# Patient Record
Sex: Female | Born: 1940
Health system: Southern US, Community
[De-identification: ages and names within clinical notes are randomized; demographics above are authoritative.]

## PROBLEM LIST (undated history)

## (undated) DIAGNOSIS — M069 Rheumatoid arthritis, unspecified: Secondary | ICD-10-CM

## (undated) DIAGNOSIS — E785 Hyperlipidemia, unspecified: Secondary | ICD-10-CM

## (undated) DIAGNOSIS — E119 Type 2 diabetes mellitus without complications: Secondary | ICD-10-CM

## (undated) DIAGNOSIS — J439 Emphysema, unspecified: Secondary | ICD-10-CM

## (undated) DIAGNOSIS — I10 Essential (primary) hypertension: Secondary | ICD-10-CM

## (undated) DIAGNOSIS — R42 Dizziness and giddiness: Secondary | ICD-10-CM

## (undated) DIAGNOSIS — C539 Malignant neoplasm of cervix uteri, unspecified: Secondary | ICD-10-CM

## (undated) HISTORY — DX: Emphysema, unspecified: J43.9

## (undated) HISTORY — PX: TUBAL LIGATION: SHX77

## (undated) HISTORY — DX: Malignant neoplasm of cervix uteri, unspecified: C53.9

## (undated) HISTORY — DX: Essential (primary) hypertension: I10

## (undated) HISTORY — DX: Rheumatoid arthritis, unspecified: M06.9

## (undated) HISTORY — DX: Dizziness and giddiness: R42

## (undated) HISTORY — PX: ABDOMINAL HYSTERECTOMY: SHX81

## (undated) HISTORY — PX: BREAST SURGERY: SHX581

## (undated) HISTORY — PX: LIPOMA EXCISION: SHX5283

## (undated) HISTORY — PX: OTHER SURGICAL HISTORY: SHX169

## (undated) HISTORY — DX: Type 2 diabetes mellitus without complications: E11.9

## (undated) HISTORY — DX: Hyperlipidemia, unspecified: E78.5

## (undated) HISTORY — PX: TUMOR REMOVAL: SHX12

---

## 1997-11-21 ENCOUNTER — Ambulatory Visit (HOSPITAL_COMMUNITY): Admission: RE | Admit: 1997-11-21 | Discharge: 1997-11-21 | Payer: Self-pay | Admitting: Family Medicine

## 1997-12-20 ENCOUNTER — Other Ambulatory Visit: Admission: RE | Admit: 1997-12-20 | Discharge: 1997-12-20 | Payer: Self-pay | Admitting: Family Medicine

## 1998-02-04 ENCOUNTER — Other Ambulatory Visit: Admission: RE | Admit: 1998-02-04 | Discharge: 1998-02-04 | Payer: Self-pay | Admitting: Obstetrics and Gynecology

## 1998-02-18 ENCOUNTER — Ambulatory Visit (HOSPITAL_COMMUNITY): Admission: RE | Admit: 1998-02-18 | Discharge: 1998-02-18 | Payer: Self-pay | Admitting: Family Medicine

## 1998-02-19 ENCOUNTER — Ambulatory Visit (HOSPITAL_COMMUNITY): Admission: RE | Admit: 1998-02-19 | Discharge: 1998-02-19 | Payer: Self-pay | Admitting: Obstetrics and Gynecology

## 1998-02-26 ENCOUNTER — Inpatient Hospital Stay (HOSPITAL_COMMUNITY): Admission: RE | Admit: 1998-02-26 | Discharge: 1998-03-03 | Payer: Self-pay | Admitting: Obstetrics and Gynecology

## 1998-05-19 ENCOUNTER — Inpatient Hospital Stay (HOSPITAL_COMMUNITY): Admission: EM | Admit: 1998-05-19 | Discharge: 1998-05-26 | Payer: Self-pay | Admitting: Emergency Medicine

## 1998-12-25 ENCOUNTER — Other Ambulatory Visit: Admission: RE | Admit: 1998-12-25 | Discharge: 1998-12-25 | Payer: Self-pay | Admitting: Obstetrics and Gynecology

## 1999-12-25 ENCOUNTER — Ambulatory Visit (HOSPITAL_COMMUNITY): Admission: RE | Admit: 1999-12-25 | Discharge: 1999-12-25 | Payer: Self-pay | Admitting: *Deleted

## 2001-07-27 ENCOUNTER — Ambulatory Visit (HOSPITAL_COMMUNITY): Admission: RE | Admit: 2001-07-27 | Discharge: 2001-07-27 | Payer: Self-pay | Admitting: Family Medicine

## 2003-04-23 ENCOUNTER — Ambulatory Visit (HOSPITAL_COMMUNITY): Admission: RE | Admit: 2003-04-23 | Discharge: 2003-04-23 | Payer: Self-pay | Admitting: Family Medicine

## 2004-03-11 ENCOUNTER — Ambulatory Visit: Payer: Self-pay | Admitting: Nurse Practitioner

## 2004-03-21 ENCOUNTER — Ambulatory Visit: Payer: Self-pay | Admitting: *Deleted

## 2004-11-06 ENCOUNTER — Ambulatory Visit: Payer: Self-pay | Admitting: Nurse Practitioner

## 2004-12-01 ENCOUNTER — Ambulatory Visit: Payer: Self-pay | Admitting: Nurse Practitioner

## 2004-12-08 ENCOUNTER — Ambulatory Visit (HOSPITAL_COMMUNITY): Admission: RE | Admit: 2004-12-08 | Discharge: 2004-12-08 | Payer: Self-pay | Admitting: Family Medicine

## 2004-12-15 ENCOUNTER — Ambulatory Visit: Payer: Self-pay | Admitting: Nurse Practitioner

## 2004-12-29 ENCOUNTER — Ambulatory Visit: Payer: Self-pay | Admitting: Nurse Practitioner

## 2005-01-26 ENCOUNTER — Ambulatory Visit: Payer: Self-pay | Admitting: Family Medicine

## 2005-03-24 ENCOUNTER — Ambulatory Visit: Payer: Self-pay | Admitting: Nurse Practitioner

## 2005-10-21 ENCOUNTER — Encounter: Payer: Self-pay | Admitting: Family Medicine

## 2006-01-14 ENCOUNTER — Ambulatory Visit: Payer: Self-pay | Admitting: Nurse Practitioner

## 2006-01-21 ENCOUNTER — Ambulatory Visit (HOSPITAL_COMMUNITY): Admission: RE | Admit: 2006-01-21 | Discharge: 2006-01-21 | Payer: Self-pay | Admitting: Family Medicine

## 2006-04-13 ENCOUNTER — Ambulatory Visit: Payer: Self-pay | Admitting: Nurse Practitioner

## 2006-06-12 ENCOUNTER — Emergency Department (HOSPITAL_COMMUNITY): Admission: EM | Admit: 2006-06-12 | Discharge: 2006-06-12 | Payer: Self-pay | Admitting: Emergency Medicine

## 2007-10-06 ENCOUNTER — Ambulatory Visit: Payer: Self-pay | Admitting: Pulmonary Disease

## 2007-10-06 DIAGNOSIS — E785 Hyperlipidemia, unspecified: Secondary | ICD-10-CM | POA: Insufficient documentation

## 2007-10-06 DIAGNOSIS — E1159 Type 2 diabetes mellitus with other circulatory complications: Secondary | ICD-10-CM | POA: Insufficient documentation

## 2007-10-06 DIAGNOSIS — J383 Other diseases of vocal cords: Secondary | ICD-10-CM | POA: Insufficient documentation

## 2007-10-06 DIAGNOSIS — R0602 Shortness of breath: Secondary | ICD-10-CM

## 2007-10-06 DIAGNOSIS — I10 Essential (primary) hypertension: Secondary | ICD-10-CM | POA: Insufficient documentation

## 2007-10-06 DIAGNOSIS — Z8541 Personal history of malignant neoplasm of cervix uteri: Secondary | ICD-10-CM | POA: Insufficient documentation

## 2007-10-06 DIAGNOSIS — J438 Other emphysema: Secondary | ICD-10-CM | POA: Insufficient documentation

## 2007-10-06 DIAGNOSIS — J45909 Unspecified asthma, uncomplicated: Secondary | ICD-10-CM | POA: Insufficient documentation

## 2007-10-06 DIAGNOSIS — R0600 Dyspnea, unspecified: Secondary | ICD-10-CM | POA: Insufficient documentation

## 2007-10-24 ENCOUNTER — Ambulatory Visit: Payer: Self-pay | Admitting: Pulmonary Disease

## 2007-11-09 ENCOUNTER — Encounter: Payer: Self-pay | Admitting: Pulmonary Disease

## 2007-11-21 ENCOUNTER — Encounter: Payer: Self-pay | Admitting: Pulmonary Disease

## 2007-12-10 IMAGING — CR DG KNEE COMPLETE 4+V*L*
4 series · 4 of 4 positions shown · non-contrast
Comparison: none

CLINICAL DATA: 55-year-old, fell.
 RIGHT HAND ? 3 VIEW:

[t knee ap left *]
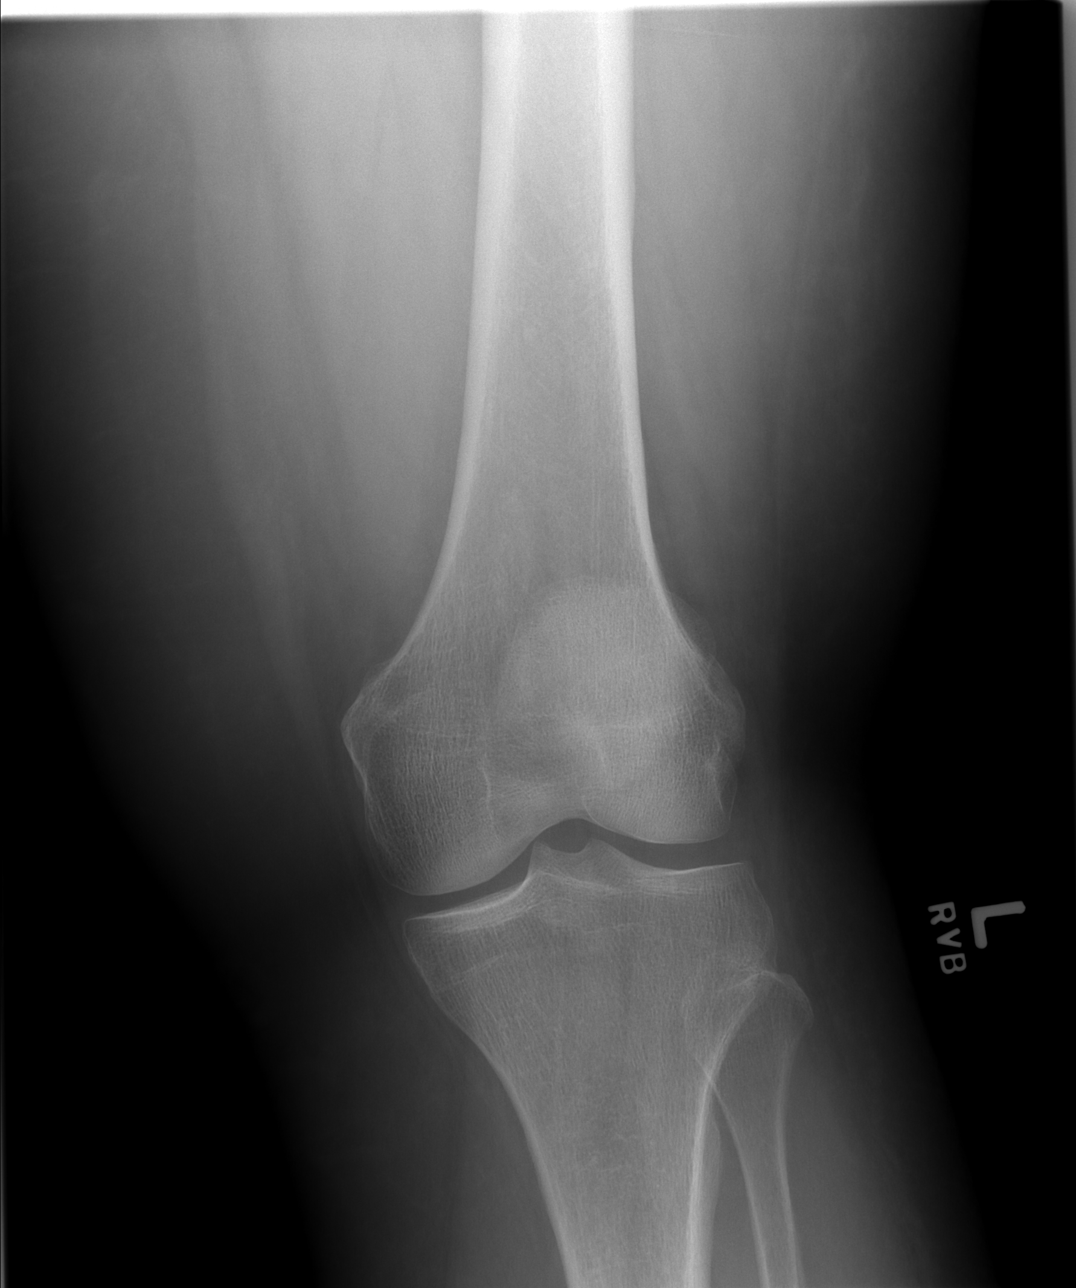

[t knee oblique left (1 of 2)]
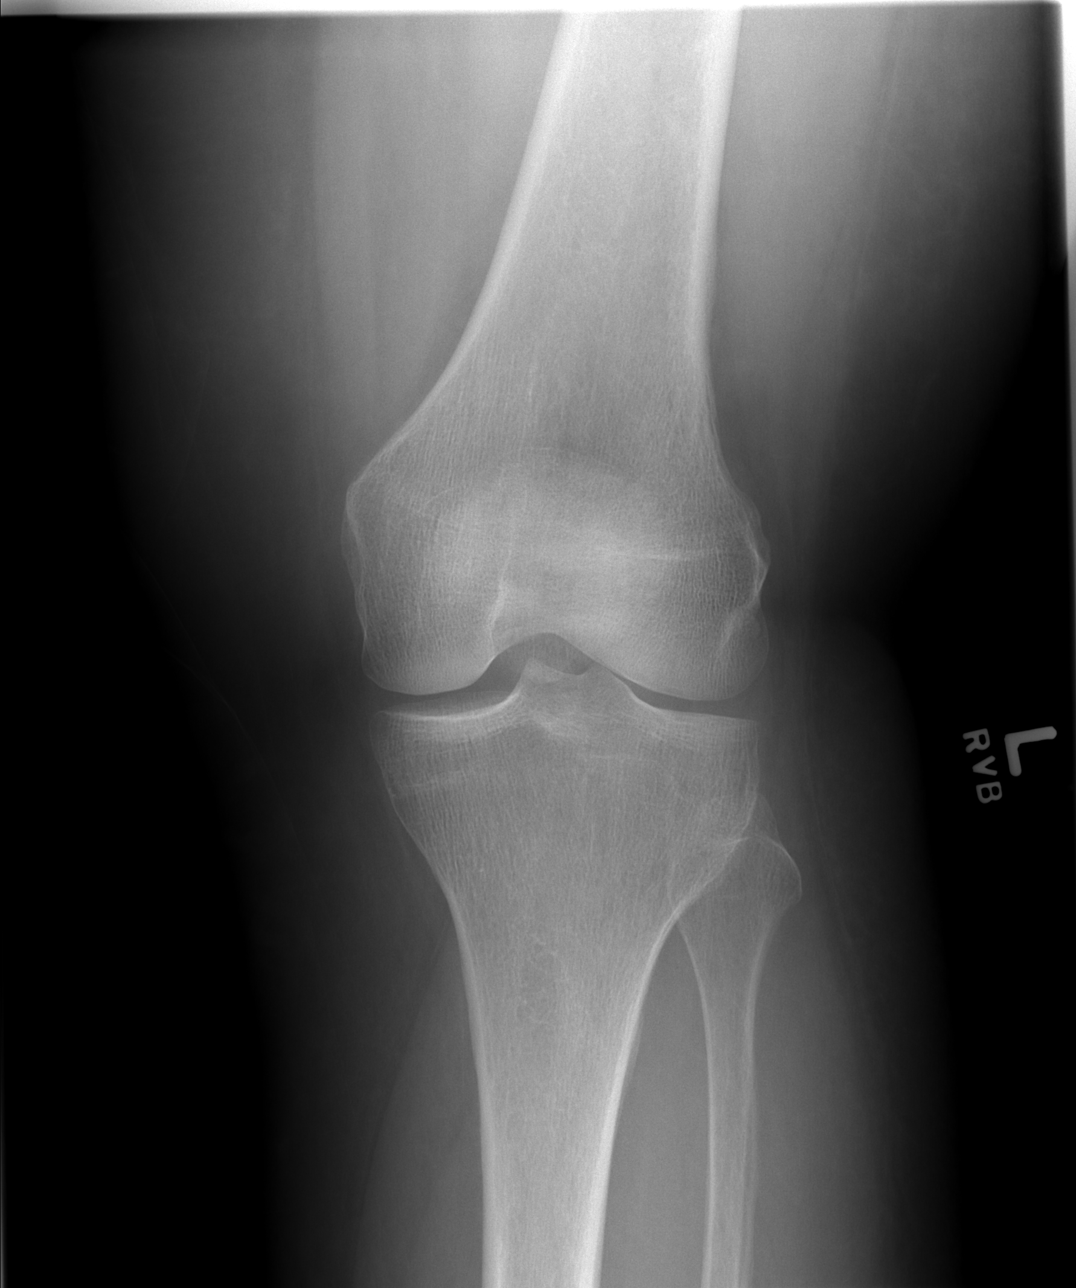

[t knee oblique left (2 of 2)]
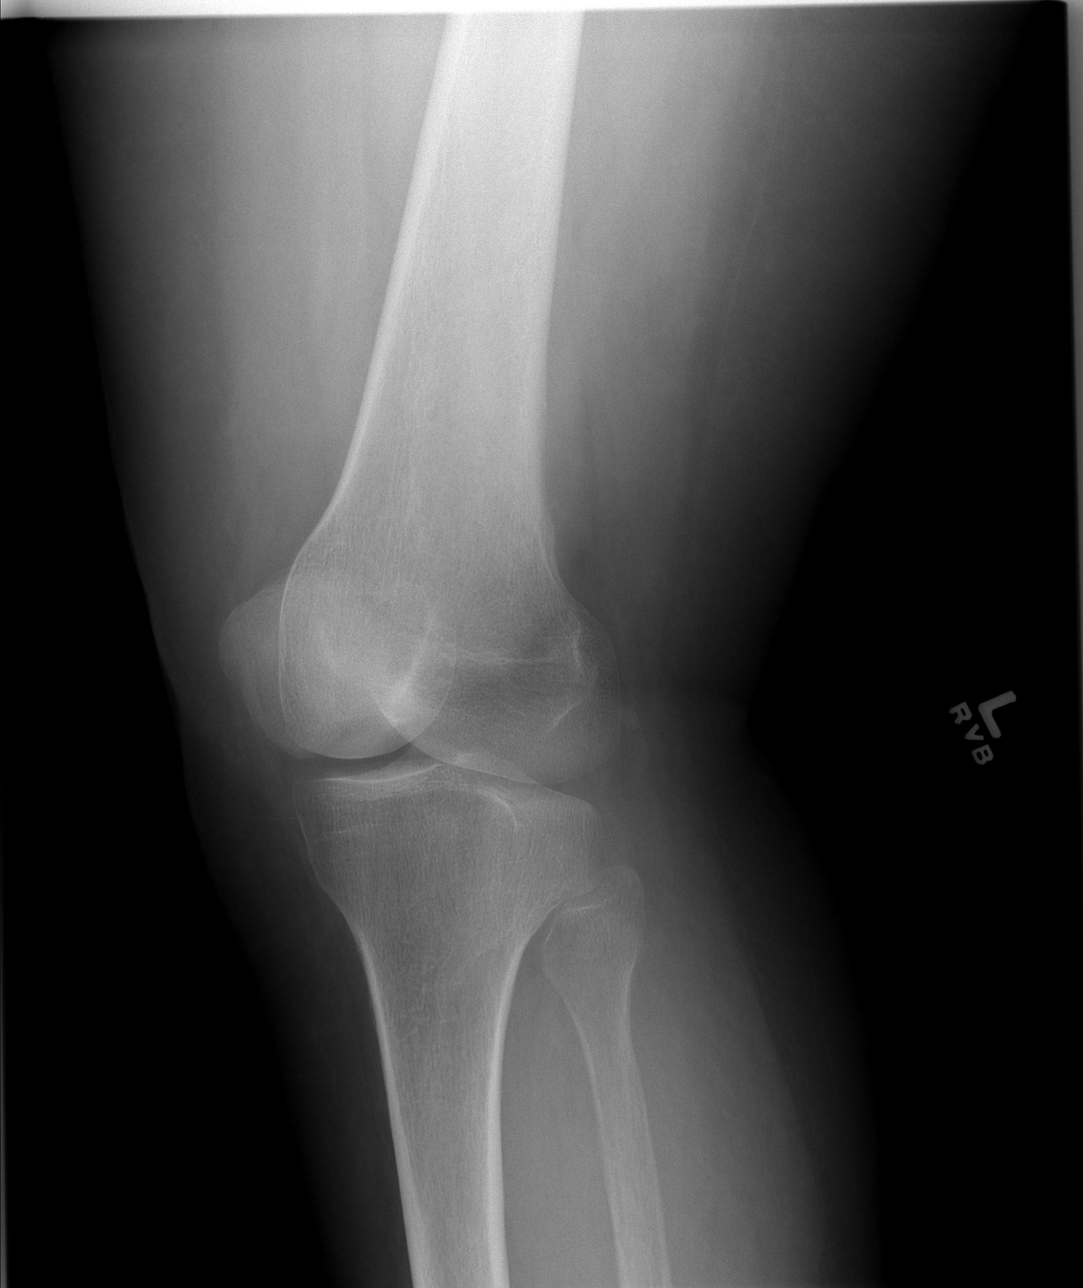

[t knee lat left]
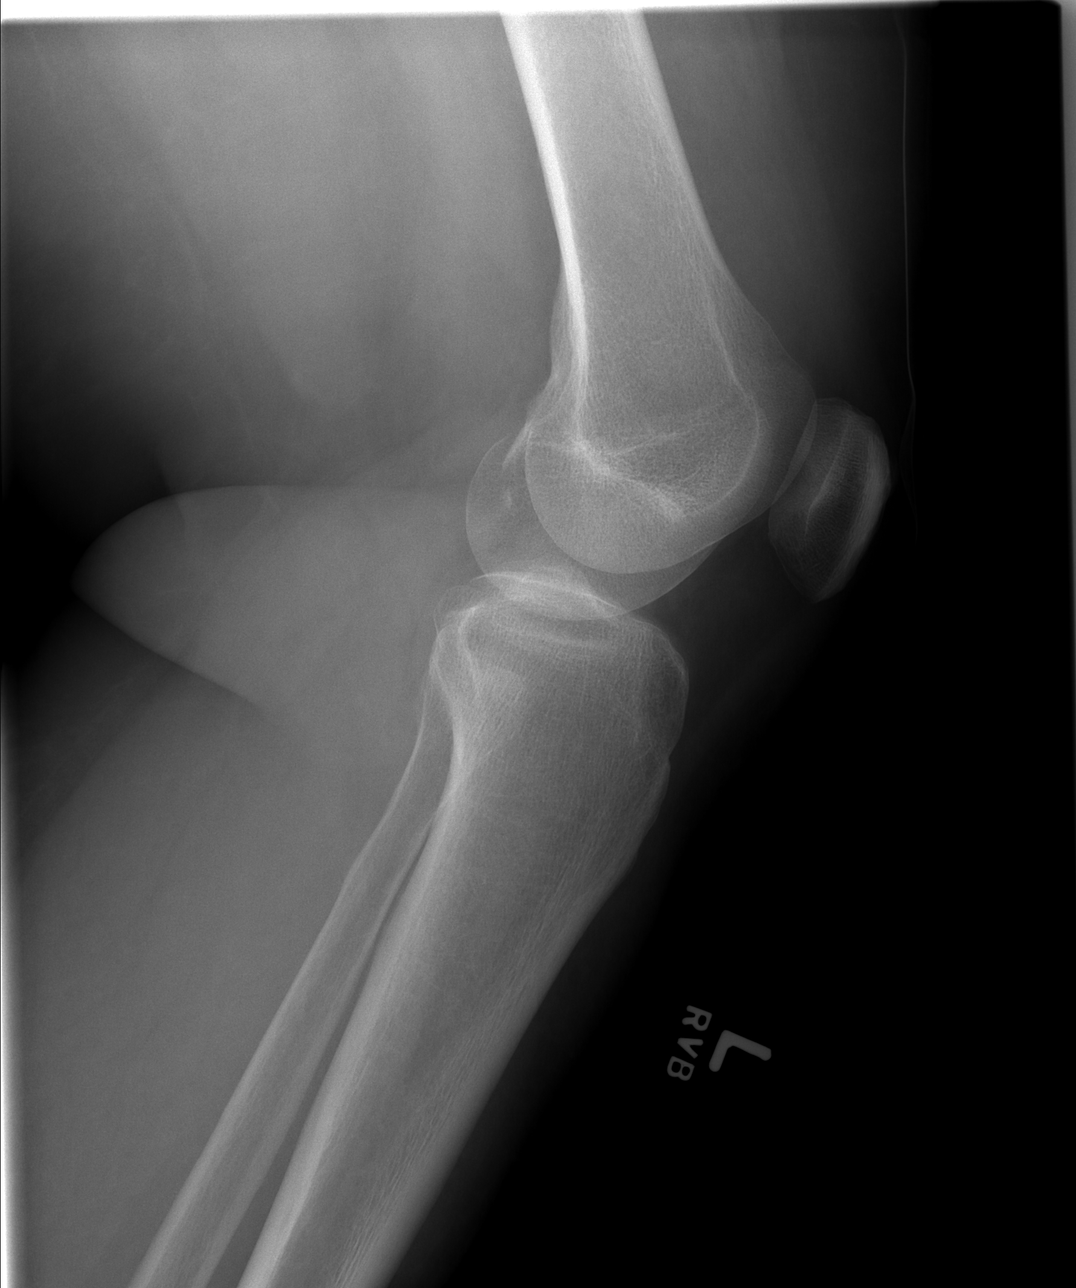

[4 of 4 positions shown; findings below may reference images not displayed]

FINDINGS: There is a fracture at the base of the fifth metacarpal.  Joint spaces are maintained.  No other fractures are seen.
IMPRESSION: Fifth metacarpal base fracture.
 LEFT KNEE ? 4 VIEW:
FINDINGS: There is no evidence of fracture, dislocation, or joint effusion.  There is no evidence of arthropathy or other focal bone abnormality.  Soft tissues are unremarkable.
IMPRESSION: Negative.
 CHEST - 2 VIEW:
FINDINGS: The heart size and mediastinal contours are within normal limits.  Both lungs are clear.  The visualized skeletal structures are unremarkable.
IMPRESSION: No active cardiopulmonary disease.

## 2007-12-10 IMAGING — CR DG HAND COMPLETE 3+V*R*
3 series · 3 of 3 positions shown · non-contrast
Comparison: none

CLINICAL DATA: 55-year-old, fell.
 RIGHT HAND ? 3 VIEW:

[x hand ap right]
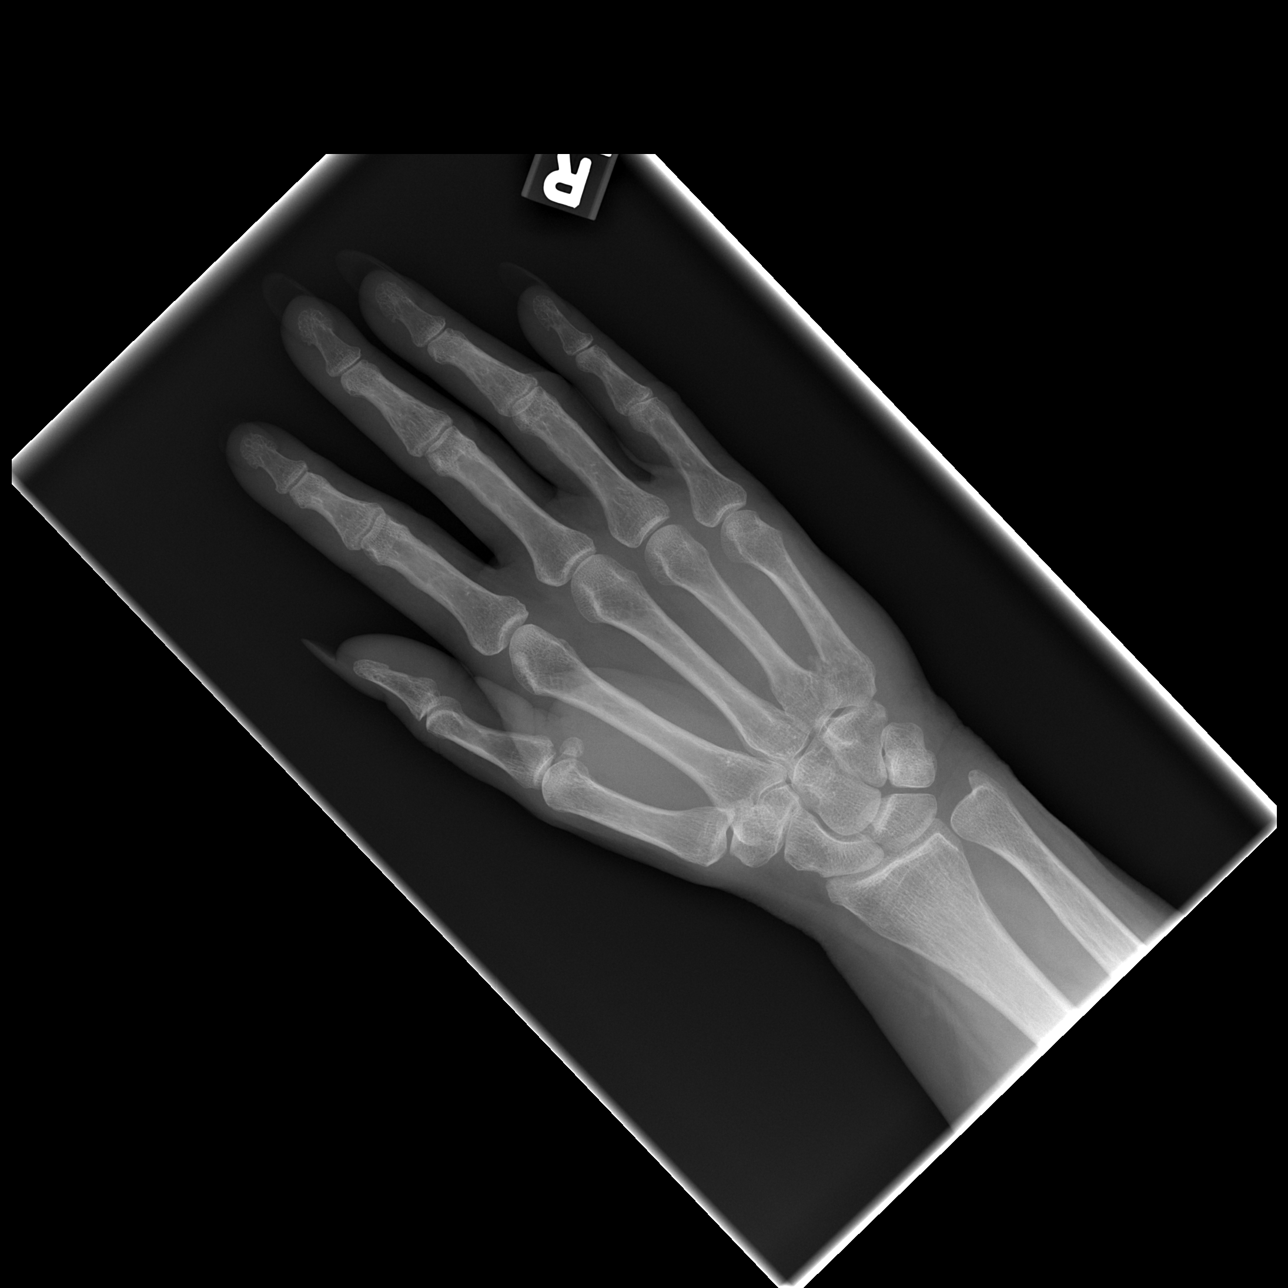

[x hand oblique right]
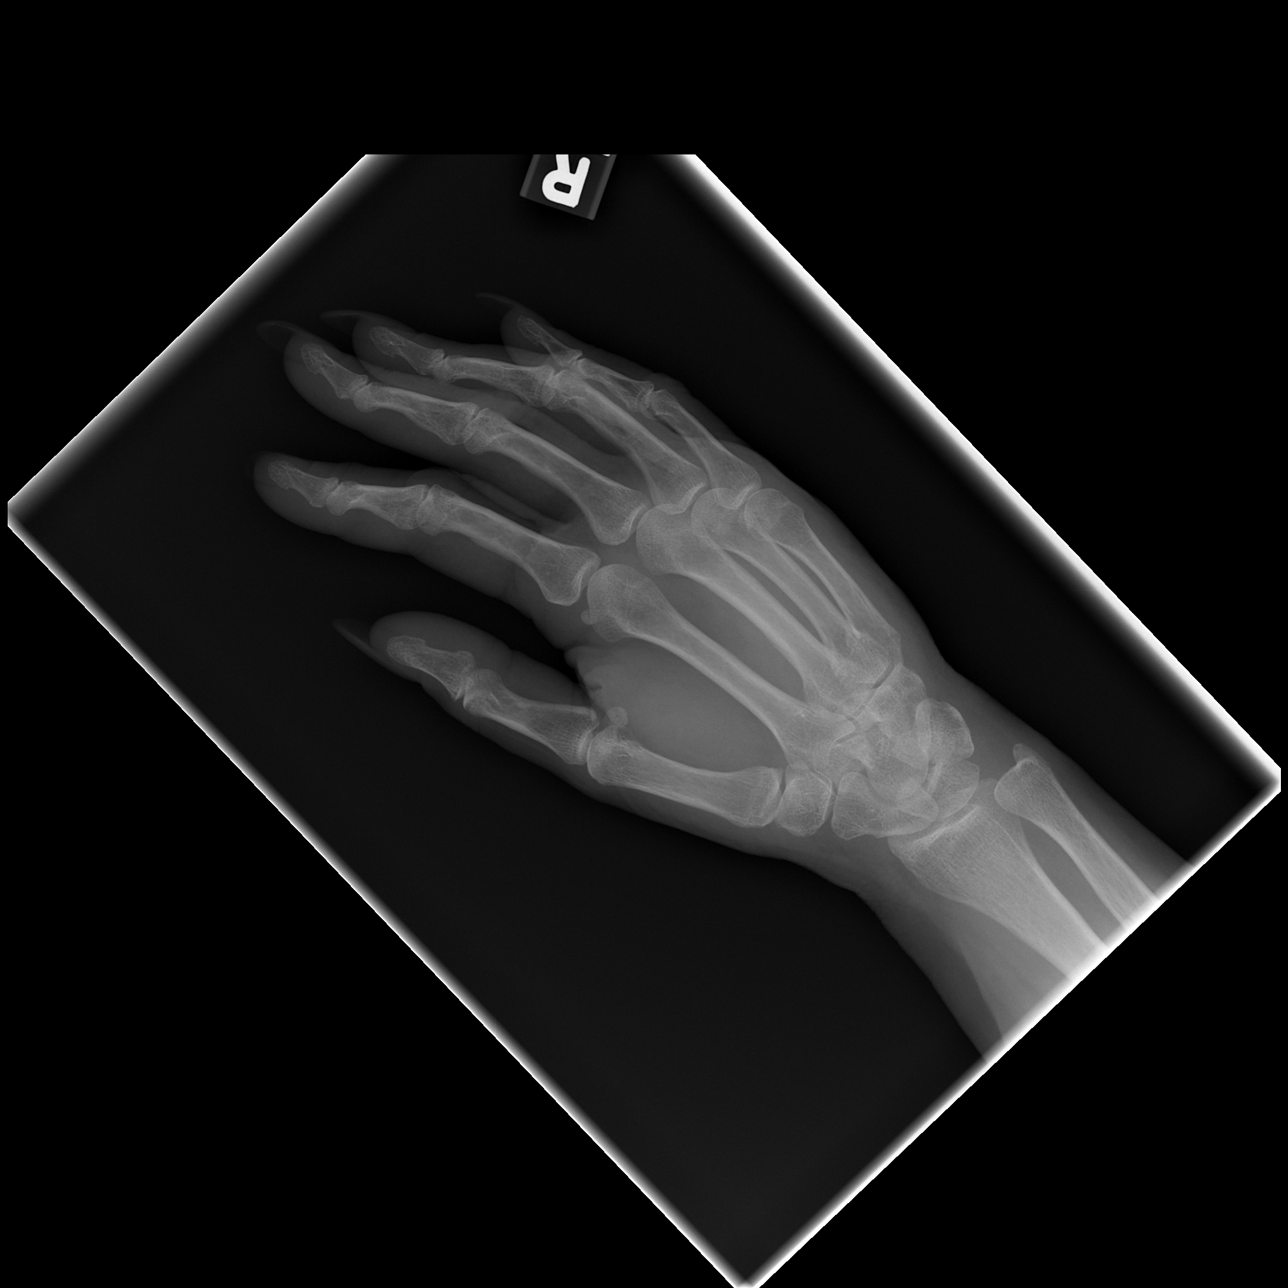

[x hand lat right]
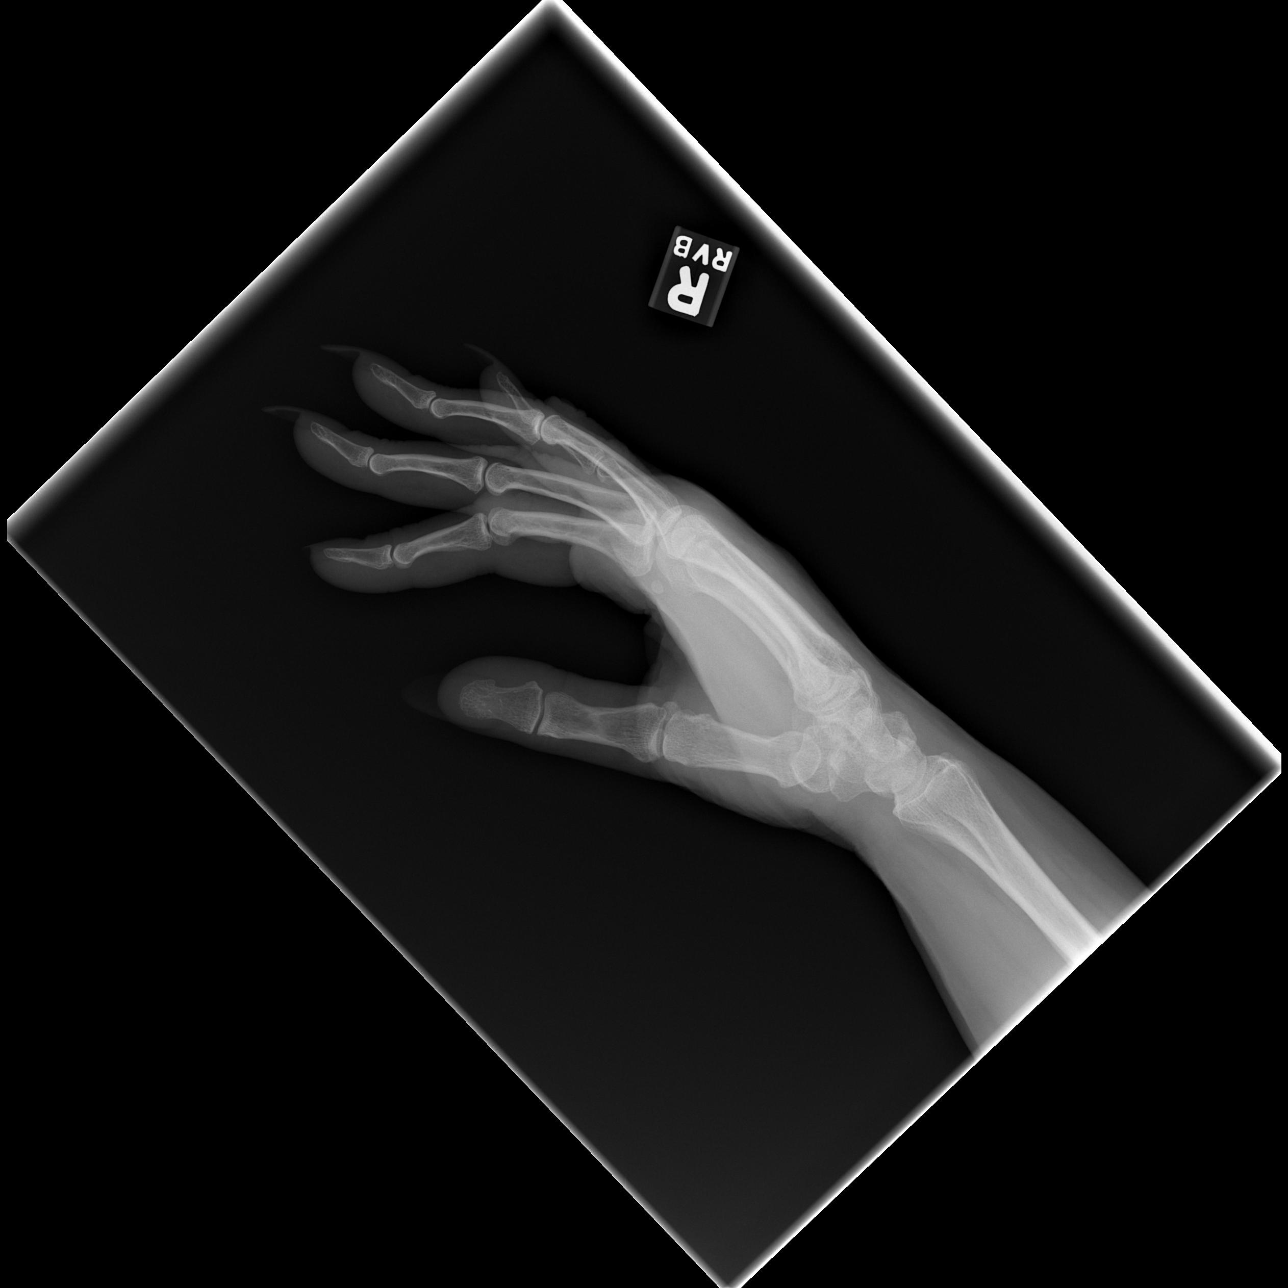

[3 of 3 positions shown; findings below may reference images not displayed]

FINDINGS: There is a fracture at the base of the fifth metacarpal.  Joint spaces are maintained.  No other fractures are seen.
IMPRESSION: Fifth metacarpal base fracture.
 LEFT KNEE ? 4 VIEW:
FINDINGS: There is no evidence of fracture, dislocation, or joint effusion.  There is no evidence of arthropathy or other focal bone abnormality.  Soft tissues are unremarkable.
IMPRESSION: Negative.
 CHEST - 2 VIEW:
FINDINGS: The heart size and mediastinal contours are within normal limits.  Both lungs are clear.  The visualized skeletal structures are unremarkable.
IMPRESSION: No active cardiopulmonary disease.

## 2008-04-23 ENCOUNTER — Ambulatory Visit: Payer: Self-pay | Admitting: Pulmonary Disease

## 2008-10-22 ENCOUNTER — Ambulatory Visit: Payer: Self-pay | Admitting: Pulmonary Disease

## 2009-04-02 ENCOUNTER — Encounter: Admission: RE | Admit: 2009-04-02 | Discharge: 2009-04-02 | Payer: Self-pay | Admitting: Rheumatology

## 2009-04-02 ENCOUNTER — Encounter: Payer: Self-pay | Admitting: Rheumatology

## 2009-04-22 ENCOUNTER — Ambulatory Visit: Payer: Self-pay | Admitting: Pulmonary Disease

## 2009-05-21 ENCOUNTER — Telehealth (INDEPENDENT_AMBULATORY_CARE_PROVIDER_SITE_OTHER): Payer: Self-pay | Admitting: *Deleted

## 2009-10-21 ENCOUNTER — Ambulatory Visit: Payer: Self-pay | Admitting: Pulmonary Disease

## 2010-04-21 ENCOUNTER — Ambulatory Visit: Payer: Self-pay | Admitting: Pulmonary Disease

## 2010-07-13 ENCOUNTER — Encounter: Payer: Self-pay | Admitting: Obstetrics and Gynecology

## 2010-07-24 NOTE — Assessment & Plan Note (Signed)
Summary: rov for emphysema/asthma   Copy to:  [name] [facility]) Primary Provider/Referring Provider:  Catha Gosselin  CC:  Pt is here for a 6 month f/u appt.  .  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  Overall, she is doing fairly well, and has not had any acute exacerbation.  She does not hardly use her rescue inhaler.  She denies congestion or mucus production.  She is now off prednisone since starting mtx for her RA.  Current Medications (verified): 1)  Diovan 160 Mg Tabs (Valsartan) .... Take 1 Tablet By Mouth Once A Day 2)  Proair Hfa 108 (90 Base) Mcg/act  Aers (Albuterol Sulfate) .Marland Kitchen.. 1-2 Puffs Every 4-6 Hours As Needed 3)  Albuterol Sulfate (2.5 Mg/6ml) 0.083% Nebu (Albuterol Sulfate) .... Use 1 Vial Via Nebulizer As Needed 4)  Multivitamins   Tabs (Multiple Vitamin) .... Take 1 Tablet By Mouth Once A Day 5)  Iodine Liquid .... Put 5 Drops in A Liquid and Drink Once Daily 6)  Fish Oil 1000 Mg  Caps (Omega-3 Fatty Acids) .... Take 2 Tabs By Mouth Once Daily 7)  Methotrexate (Unsure of Dosage) .... Once Weekly Injection 8)  Symbicort 160-4.5 Mcg/act  Aero (Budesonide-Formoterol Fumarate) .... Two Puffs Twice Daily 9)  Folic Acid 1 Mg Tabs (Folic Acid) .... Take 1 Tablet By Mouth Once A Day 10)  Milk Thistle 200 Mg Caps (Milk Thistle) .... 400mg  Daily 11)  Selenium 100 Mcg Tabs (Selenium) .... Take 1 Tablet By Mouth Once A Day 12)  Biotin .... Take 1 Tablet By Mouth Once A Day 13)  Grapeseed Extract 500-50 Mg Caps (Nutritional Supplements) .... Take 1 Tablet By Mouth Once A Day 14)  Butchers Broom .... Take 1 Tablet By Mouth Once A Day  Allergies (verified): 1)  ! Theophylline  Review of Systems      See HPI  Vital Signs:  Patient profile:   70 year old female Height:      67 inches Weight:      280.25 pounds O2 Sat:      95 % on Room air Temp:     97.7 degrees F oral Pulse rate:   85 /  minute BP sitting:   140 / 82  (left arm) Cuff size:   large  Vitals Entered By: Arman Filter LPN (Oct 22, 4130 1:38 PM)  O2 Flow:  Room air CC: Pt is here for a 6 month f/u appt.   Comments Medications reviewed with patient Arman Filter LPN  Oct 22, 4399 1:39 PM    Physical Exam  General:  obese female with dysphonia Lungs:  totally clear to auscultation Heart:  rrr, no mrg   Impression & Recommendations:  Problem # 1:  EMPHYSEMA (ICD-492.8)  with asthmatic component.  She is doing fairly well on symbicort, and should continue on this.  I think weight loss and conditioning are going to be the only way she will see a significant change in her exertional tolerance.  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 Once weekly injection 2)  Milk Thistle 200 Mg Caps (Milk thistle) .... 400mg  daily 3)  Selenium 100 Mcg Tabs (Selenium) .... Take 1 tablet by mouth once a day 4)  Biotin  .... Take 1 tablet by mouth once a  day 5)  Grapeseed Extract 500-50 Mg Caps (Nutritional supplements) .... Take 1 tablet by mouth once a day 6)  Butchers Broom  .... Take 1 tablet by mouth once a day  Other Orders: Est. Patient Level II ([address])  Patient Instructions: 1)  stay on current meds. 2)  continue to work on weight loss and conditioning 3)  followup with me in 6mos  Prescriptions: SYMBICORT 160-4.5 MCG/ACT  AERO (BUDESONIDE-FORMOTEROL FUMARATE) Two puffs twice daily  #1 x 6   Entered and Authorized by:   Barbaraann Share MD   Signed by:   Barbaraann Share MD on 10/21/2009   Method used:   Print then Give to Patient   RxID:   [id number]    Immunization History:  Pneumovax Immunization History:    Pneumovax:  historical (06/27/2007)   Appended Document: rov for emphysema/asthma The patient sees Aida Puffer, MD in [place] not Catha Gosselin MD

## 2010-07-24 NOTE — Assessment & Plan Note (Signed)
Summary: rov for asthma, vcd   Copy to:  [name] [facility]) Primary Provider/Referring Provider:  Aida Puffer  CC:  6 month follow up. pt states breathing is doing pretty good. pt states she gets clera phlem up in the mornings only. pt states everything seems fine. pt quit smoking 1988.  History of Present Illness: the pt comes in today for f/u of her known asthma.  She has maintained on her current bronchodilator regimen, and feels she is doing very well.  She has not had a recent exacerbation, and does not use her rescue inhaler very often.  She has no significant cough or congestion.  She has her ongoing voice change that is chronic, but is not worsening.  She is pleased with her current pulmonary status.  Current Medications (verified): 1)  Diovan 160 Mg Tabs (Valsartan) .... Take 1 Tablet By Mouth Once A Day 2)  Proair Hfa 108 (90 Base) Mcg/act  Aers (Albuterol Sulfate) .Marland Kitchen.. 1-2 Puffs Every 4-6 Hours As Needed 3)  Albuterol Sulfate (2.5 Mg/13ml) 0.083% Nebu (Albuterol Sulfate) .... Use 1 Vial Via Nebulizer As Needed 4)  Multivitamins   Tabs (Multiple Vitamin) .... Take 1 Tablet By Mouth Once A Day 5)  Iodine Liquid .... Put 3 Drops in A Liquid and Drink Once Daily 6)  Fish Oil 1000 Mg  Caps (Omega-3 Fatty Acids) .... Take 2 Tabs By Mouth Once Daily 7)  Methotrexate (Unsure of Dosage) .... Once Weekly Injection 8)  Symbicort 160-4.5 Mcg/act  Aero (Budesonide-Formoterol Fumarate) .... Two Puffs Twice Daily 9)  Folic Acid 1 Mg Tabs (Folic Acid) .... Take 1 Tablet By Mouth Once A Day 10)  Milk Thistle 200 Mg Caps (Milk Thistle) .... 400mg  Daily 11)  Selenium 100 Mcg Tabs (Selenium) .... Take 1 Tablet By Mouth Once A Day 12)  Biotin .... Take 1 Tablet By Mouth Once A Day  Allergies (verified): 1)  ! Theophylline  Review of Systems  The patient denies shortness of breath with activity, shortness of breath at rest, productive cough, non-productive cough, coughing up blood, chest pain,  irregular heartbeats, acid heartburn, indigestion, loss of appetite, weight change, abdominal pain, difficulty swallowing, sore throat, tooth/dental problems, headaches, nasal congestion/difficulty breathing through nose, sneezing, itching, ear ache, anxiety, depression, hand/feet swelling, joint stiffness or pain, rash, change in color of mucus, and fever.    Vital Signs:  Patient profile:   70 year old female Height:      67 inches Weight:      256.50 pounds BMI:     40.32 O2 Sat:      98 % on Room air Temp:     97.5 degrees F oral Pulse rate:   74 / minute BP sitting:   112 / 64  (left arm) Cuff size:   large  Vitals Entered By: Carver Fila (April 21, 2010 1:30 PM)  O2 Flow:  Room air CC: 6 month follow up. pt states breathing is doing pretty good. pt states she gets clera phlem up in the mornings only. pt states everything seems fine. pt quit smoking 1988 Comments meds and allergies updated Phone number updated Carver Fila  April 21, 2010 1:34 PM    Physical Exam  General:  obese female in nad Nose:  no drainage or purulence noted. Lungs:  totally clear to auscultation no wheezing or rhonchi Heart:  rrr, no mrg Extremities:  no signficant edema, or cyanosis  Neurologic:  alert and oriented, moves all 4.   Impression &  Recommendations:  Problem # 1:  ASTHMA (ICD-493.90) the pt has probable asthma > emphysema, and is doing well on current meds.  She rarely uses her rescue inhaler, and feels her symptoms are fairly well controlled.  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  This is not related to her inhaled corticosteroids.  Medications Added to Medication List This Visit: 1)  Iodine Liquid  .... Put 3 drops in a liquid and drink once daily  Other Orders: Est. Patient Level III ([address])  Patient Instructions: 1)  no change in  meds 2)  keep working on weight reduction 3)  followup with me in 6mos or sooner if having issues.   Prescriptions: SYMBICORT 160-4.5 MCG/ACT  AERO (BUDESONIDE-FORMOTEROL FUMARATE) Two puffs twice daily  #1 x 6   Entered and Authorized by:   Barbaraann Share MD   Signed by:   Barbaraann Share MD on 04/21/2010   Method used:   Electronically to        [facility] (retail)       [address]       [place], [place]  [address]       Ph: [phone]       Fax: [phone]   RxID:   [id number] PROAIR HFA 108 (90 BASE) MCG/ACT  AERS (ALBUTEROL SULFATE) 1-2 puffs every 4-6 hours as needed  #1 x 6   Entered and Authorized by:   Barbaraann Share MD   Signed by:   Barbaraann Share MD on 04/21/2010   Method used:   Electronically to        [facility] (retail)       [address]       [place], [place]  [address]       Ph: [phone]       Fax: [phone]   RxID:   [id number]    Immunization History:  Pneumovax Immunization History:    Pneumovax:  historical (03/22/2009)

## 2010-10-17 ENCOUNTER — Encounter: Payer: Self-pay | Admitting: Pulmonary Disease

## 2010-10-20 ENCOUNTER — Encounter: Payer: Self-pay | Admitting: Pulmonary Disease

## 2010-10-20 ENCOUNTER — Ambulatory Visit (INDEPENDENT_AMBULATORY_CARE_PROVIDER_SITE_OTHER): Payer: Medicare Other | Admitting: Pulmonary Disease

## 2010-10-20 VITALS — BP 112/68 | HR 78 | Temp 97.7°F | Ht 67.0 in | Wt 255.4 lb

## 2010-10-20 DIAGNOSIS — J449 Chronic obstructive pulmonary disease, unspecified: Secondary | ICD-10-CM

## 2010-10-20 MED ORDER — ALBUTEROL SULFATE HFA 108 (90 BASE) MCG/ACT IN AERS: 2.0000 | INHALATION_SPRAY | Freq: Four times a day (QID) | RESPIRATORY_TRACT | Status: DC | PRN

## 2010-10-20 MED ORDER — BUDESONIDE-FORMOTEROL FUMARATE 160-4.5 MCG/ACT IN AERO: 2.0000 | INHALATION_SPRAY | Freq: Two times a day (BID) | RESPIRATORY_TRACT | Status: DC

## 2010-10-20 NOTE — Progress Notes (Signed)
  Subjective:    Patient ID: Tina Hansen, female    DOB: May 18, 1941, 70 y.o.   MRN: [id number]  HPI The pt comes in today for f/u of her known copd, secondary to asthma and probable emphysema.  She has been doing very well on her current regimen, and continues to take symbicort only once a day despite my recommendations.  She rarely uses rescue inhaler, and has not had a recent exacerbation.    Review of Systems  Constitutional: Negative for fever and unexpected weight change.  HENT: Positive for congestion. Negative for ear pain, nosebleeds, sore throat, rhinorrhea, sneezing, trouble swallowing, dental problem, postnasal drip and sinus pressure.   Eyes: Negative for redness and itching.  Respiratory: Negative for cough, chest tightness, shortness of breath and wheezing.   Cardiovascular: Positive for leg swelling. Negative for palpitations.  Gastrointestinal: Negative for nausea and vomiting.  Genitourinary: Negative for dysuria.  Musculoskeletal: Negative for joint swelling.  Skin: Negative for rash.  Neurological: Negative for headaches.  Hematological: Does not bruise/bleed easily.  Psychiatric/Behavioral: Negative for dysphoric mood. The patient is not nervous/anxious.        Objective:   Physical Exam Obese female in nad No purulence or discharge noted from nares Chest totally clear Cor with rrr LE with no edema, no cyanosis noted Alert and oriented, moves all 4        Assessment & Plan:

## 2010-10-20 NOTE — Patient Instructions (Signed)
Continue with symbicort, but take am and pm as much as you can Continue to work on conditioning and weight loss followup with me in 6mos.

## 2010-10-20 NOTE — Assessment & Plan Note (Signed)
The pt is doing very well on her current regimen, and I have asked her to continue with this. She has not had any recent flares or pulmonary infections.  I have also encouraged her to stay as active as possible, and to work on weight loss.

## 2011-04-20 ENCOUNTER — Ambulatory Visit (INDEPENDENT_AMBULATORY_CARE_PROVIDER_SITE_OTHER): Payer: Medicare Other | Admitting: Pulmonary Disease

## 2011-04-20 ENCOUNTER — Encounter: Payer: Self-pay | Admitting: Pulmonary Disease

## 2011-04-20 VITALS — BP 134/84 | HR 79 | Temp 97.8°F | Ht 67.0 in | Wt 265.8 lb

## 2011-04-20 DIAGNOSIS — J449 Chronic obstructive pulmonary disease, unspecified: Secondary | ICD-10-CM

## 2011-04-20 MED ORDER — BUDESONIDE-FORMOTEROL FUMARATE 160-4.5 MCG/ACT IN AERO: 2.0000 | INHALATION_SPRAY | Freq: Two times a day (BID) | RESPIRATORY_TRACT | Status: DC

## 2011-04-20 MED ORDER — ALBUTEROL SULFATE HFA 108 (90 BASE) MCG/ACT IN AERS: 2.0000 | INHALATION_SPRAY | Freq: Four times a day (QID) | RESPIRATORY_TRACT | Status: DC | PRN

## 2011-04-20 NOTE — Progress Notes (Signed)
  Subjective:    Patient ID: Tina Hansen, female    DOB: [birth year], 70 y.o.   MRN: [id number]  HPI The patient comes in today for followup of her known chronic obstructive pulmonary disease.  She is felt to have primarily asthma with a probable component of emphysema.  She has maintained on a good bronchodilator regimen, and denies any recent acute exacerbation.  She has no significant cough or mucus production except first thing in the morning, and it is always clear.  She believes this is due to postnasal drip.   Review of Systems  Constitutional: Negative for fever and unexpected weight change.  HENT: Negative for ear pain, nosebleeds, congestion, sore throat, rhinorrhea, sneezing, trouble swallowing, dental problem, postnasal drip and sinus pressure.   Eyes: Negative for redness and itching.  Respiratory: Positive for cough. Negative for chest tightness, shortness of breath and wheezing.   Cardiovascular: Negative for palpitations and leg swelling.  Gastrointestinal: Negative for nausea and vomiting.  Genitourinary: Negative for dysuria.  Musculoskeletal: Negative for joint swelling.  Skin: Negative for rash.  Neurological: Negative for headaches.  Hematological: Does not bruise/bleed easily.  Psychiatric/Behavioral: Negative for dysphoric mood. The patient is not nervous/anxious.        Objective:   Physical Exam Obese female in nad Nose without purulence or discharge Chest totally clear, excellent airflow, no wheezing Cor with rrr LE with mild edema, no cyanosis noted. Alert, oriented, moves all 4        Assessment & Plan:

## 2011-04-20 NOTE — Patient Instructions (Signed)
No change in medications Stay active, work on weight loss followup with me in 6mos.

## 2011-04-20 NOTE — Assessment & Plan Note (Signed)
The pt is doing well on current regimen, and has not had any recent acute exacerbation.  I have asked her to continue with current meds, and to work on weight loss and conditioning.  She declined the flu vaccine today.

## 2011-10-19 ENCOUNTER — Encounter: Payer: Self-pay | Admitting: Pulmonary Disease

## 2011-10-19 ENCOUNTER — Ambulatory Visit (INDEPENDENT_AMBULATORY_CARE_PROVIDER_SITE_OTHER): Payer: Medicare Other | Admitting: Pulmonary Disease

## 2011-10-19 VITALS — BP 114/78 | HR 78 | Temp 97.9°F | Ht 67.0 in | Wt 270.2 lb

## 2011-10-19 DIAGNOSIS — J449 Chronic obstructive pulmonary disease, unspecified: Secondary | ICD-10-CM

## 2011-10-19 DIAGNOSIS — J4489 Other specified chronic obstructive pulmonary disease: Secondary | ICD-10-CM

## 2011-10-19 MED ORDER — BUDESONIDE-FORMOTEROL FUMARATE 160-4.5 MCG/ACT IN AERO: 2.0000 | INHALATION_SPRAY | Freq: Two times a day (BID) | RESPIRATORY_TRACT | Status: DC

## 2011-10-19 MED ORDER — ALBUTEROL SULFATE (2.5 MG/3ML) 0.083% IN NEBU: 2.5000 mg | INHALATION_SOLUTION | Freq: Four times a day (QID) | RESPIRATORY_TRACT | Status: DC | PRN

## 2011-10-19 MED ORDER — ALBUTEROL SULFATE HFA 108 (90 BASE) MCG/ACT IN AERS: 2.0000 | INHALATION_SPRAY | Freq: Four times a day (QID) | RESPIRATORY_TRACT | Status: DC | PRN

## 2011-10-19 NOTE — Patient Instructions (Signed)
No change in medications.  Can check to see if dulera 100/5 may be cheaper than symbicort. Work on staying active, and weight loss followup with me in 6mos.

## 2011-10-19 NOTE — Assessment & Plan Note (Signed)
The patient has maintained a stable baseline since the last visit, with no acute exacerbation.  I have asked her to continue on her current bronchodilator regimen.  I suspect a lot of her dyspnea on exertion is due to her fixed airflow obstruction, but also her obesity and deconditioning.

## 2011-10-19 NOTE — Progress Notes (Signed)
  Subjective:    Patient ID: Tina Hansen, female    DOB: [birth year], 71 y.o.   MRN: [id number]  HPI The patient comes in today for followup of her known COPD, secondary to asthma and a component of emphysema.  She has been maintaining on her bronchodilator regimen, and has not had a significant exacerbation or pulmonary infection.  She has required some prednisone for a rheumatoid arthritis flare up.  She denies any chest congestion, cough with purulence, or increased wheezing.  She continues to have dyspnea on exertion that is at her normal baseline.   Review of Systems  Constitutional: Negative for fever and unexpected weight change.  HENT: Negative for ear pain, nosebleeds, congestion, sore throat, rhinorrhea, sneezing, trouble swallowing, dental problem, postnasal drip and sinus pressure.   Eyes: Negative for redness and itching.  Respiratory: Negative for cough, chest tightness, shortness of breath and wheezing.   Cardiovascular: Positive for leg swelling. Negative for palpitations.  Gastrointestinal: Negative for nausea and vomiting.  Genitourinary: Negative for dysuria.  Musculoskeletal: Positive for joint swelling.  Skin: Negative for rash.  Neurological: Negative for headaches.  Hematological: Does not bruise/bleed easily.  Psychiatric/Behavioral: Negative for dysphoric mood. The patient is not nervous/anxious.        Objective:   Physical Exam Obese female in no acute distress Nose without purulence or discharge noted Chest with mild decrease in breath sounds, but no wheezes Cardiac exam with regular rate and rhythm, 2/6 systolic murmur Lower extremities with mild edema, no cyanosis Alert and oriented, moves all 4 extremities.       Assessment & Plan:

## 2012-04-11 ENCOUNTER — Ambulatory Visit (INDEPENDENT_AMBULATORY_CARE_PROVIDER_SITE_OTHER): Payer: Medicare Other | Admitting: Pulmonary Disease

## 2012-04-11 ENCOUNTER — Encounter: Payer: Self-pay | Admitting: Pulmonary Disease

## 2012-04-11 VITALS — BP 140/82 | HR 84 | Temp 97.6°F | Ht 67.0 in | Wt 268.0 lb

## 2012-04-11 DIAGNOSIS — J449 Chronic obstructive pulmonary disease, unspecified: Secondary | ICD-10-CM

## 2012-04-11 NOTE — Assessment & Plan Note (Signed)
The patient is doing well on her current maintenance regimen, and has not had a recent acute exacerbation.  She is not over using her rescue medication.  I have asked her to continue on her bronchodilator regimen, and to work on some type of exercise program.  I offered her the flu shot today, but she has declined.

## 2012-04-11 NOTE — Patient Instructions (Addendum)
Stay on symbicort, but check to see if dulera is cheaper for you. followup with me in 6mos if doing well.

## 2012-04-11 NOTE — Progress Notes (Signed)
  Subjective:    Patient ID: Tina Hansen, female    DOB: 1940-10-22, 71 y.o.   MRN: [id number]  HPI The patient comes in today for followup of her known COPD/asthma.  She has been staying on her maintenance regimen, and feels that she is at baseline.  She rarely uses her rescue inhaler, and denies any chest congestion or mucus production.   Review of Systems  Constitutional: Negative for fever and unexpected weight change.  HENT: Positive for congestion ( am only ) and rhinorrhea. Negative for ear pain, nosebleeds, sore throat, sneezing, trouble swallowing, dental problem, postnasal drip and sinus pressure.   Eyes: Negative for redness and itching.  Respiratory: Positive for shortness of breath (upon activity ). Negative for cough, chest tightness and wheezing.   Cardiovascular: Positive for leg swelling. Negative for palpitations.  Gastrointestinal: Negative for nausea and vomiting.  Genitourinary: Negative for dysuria.  Musculoskeletal: Positive for joint swelling.  Skin: Negative for rash.  Neurological: Negative for headaches.  Hematological: Bruises/bleeds easily.  Psychiatric/Behavioral: Negative for dysphoric mood. The patient is not nervous/anxious.        Objective:   Physical Exam Morbidly obese female in no acute distress Nose without purulence or discharge noted Chest completely clear to auscultation, no wheezing Cardiac exam with regular rate and rhythm Lower extremities with no significant edema, no cyanosis Alert and oriented, moves all 4 extremities.       Assessment & Plan:

## 2012-04-18 ENCOUNTER — Ambulatory Visit: Payer: Medicare Other | Admitting: Pulmonary Disease

## 2012-10-10 ENCOUNTER — Ambulatory Visit: Payer: Medicare Other | Admitting: Pulmonary Disease

## 2012-10-24 ENCOUNTER — Encounter: Payer: Self-pay | Admitting: Pulmonary Disease

## 2012-10-24 ENCOUNTER — Ambulatory Visit (INDEPENDENT_AMBULATORY_CARE_PROVIDER_SITE_OTHER): Payer: Medicare Other | Admitting: Pulmonary Disease

## 2012-10-24 VITALS — BP 142/88 | HR 93 | Temp 96.5°F | Ht 67.0 in | Wt 269.8 lb

## 2012-10-24 DIAGNOSIS — J449 Chronic obstructive pulmonary disease, unspecified: Secondary | ICD-10-CM

## 2012-10-24 NOTE — Patient Instructions (Addendum)
No change in medications. Work on weight loss and conditioning.  followup with me in 6mos.

## 2012-10-24 NOTE — Progress Notes (Signed)
  Subjective:    Patient ID: Tina Hansen, female    DOB: December 24, 1940, 72 y.o.   MRN: [id number]  HPI The patient comes in today for followup of her known COPD, felt secondary to emphysema and asthma.  She has been staying on her current medications, and has not had an acute exacerbation since last visit.  She denies any significant cough, mucus production, or chest tightness.  She rarely has to use her rescue inhaler.   Review of Systems  Constitutional: Negative for fever and unexpected weight change.  HENT: Negative for ear pain, nosebleeds, congestion, sore throat, rhinorrhea, sneezing, trouble swallowing, dental problem, postnasal drip and sinus pressure.   Eyes: Negative for redness and itching.  Respiratory: Negative for cough, chest tightness, shortness of breath and wheezing.   Cardiovascular: Positive for leg swelling. Negative for palpitations.  Gastrointestinal: Negative for nausea and vomiting.  Genitourinary: Negative for dysuria.  Musculoskeletal: Negative for joint swelling.  Skin: Negative for rash.  Neurological: Negative for headaches.  Hematological: Does not bruise/bleed easily.  Psychiatric/Behavioral: Negative for dysphoric mood. The patient is not nervous/anxious.        Objective:   Physical Exam Obese female in no acute distress Nose without purulence or discharge noted Neck without lymphadenopathy or thyromegaly Chest with mildly decreased breath sounds, no wheezes noted Cardiac exam with regular rate and rhythm Lower extremities with 1+ edema, no cyanosis Alert and oriented, moves all 4 extremities.       Assessment & Plan:

## 2012-10-24 NOTE — Assessment & Plan Note (Signed)
The patient is doing fairly well from a breathing standpoint, and has not had a recent acute exacerbation.  I have asked her to continue on her current medical regimen, and work hard on weight loss and conditioning.  If she is doing well, she will followup with me in one year.

## 2012-11-08 ENCOUNTER — Other Ambulatory Visit: Payer: Self-pay | Admitting: Pulmonary Disease

## 2012-11-11 ENCOUNTER — Telehealth: Payer: Self-pay | Admitting: Pulmonary Disease

## 2012-11-11 ENCOUNTER — Other Ambulatory Visit: Payer: Self-pay | Admitting: *Deleted

## 2012-11-11 DIAGNOSIS — J449 Chronic obstructive pulmonary disease, unspecified: Secondary | ICD-10-CM

## 2012-11-11 MED ORDER — ALBUTEROL SULFATE HFA 108 (90 BASE) MCG/ACT IN AERS: 2.0000 | INHALATION_SPRAY | Freq: Four times a day (QID) | RESPIRATORY_TRACT | Status: DC | PRN

## 2012-11-11 MED ORDER — BUDESONIDE-FORMOTEROL FUMARATE 160-4.5 MCG/ACT IN AERO: 2.0000 | INHALATION_SPRAY | Freq: Two times a day (BID) | RESPIRATORY_TRACT | Status: DC

## 2012-11-11 NOTE — Telephone Encounter (Signed)
/  pt requesting rx's for pro air and symbicort the patient has appt 11/14 with [facility]  rx sent pt aware .

## 2013-04-24 ENCOUNTER — Encounter: Payer: Self-pay | Admitting: Pulmonary Disease

## 2013-04-24 ENCOUNTER — Ambulatory Visit (INDEPENDENT_AMBULATORY_CARE_PROVIDER_SITE_OTHER): Payer: Medicare Other | Admitting: Pulmonary Disease

## 2013-04-24 ENCOUNTER — Encounter (INDEPENDENT_AMBULATORY_CARE_PROVIDER_SITE_OTHER): Payer: Self-pay

## 2013-04-24 VITALS — BP 124/86 | HR 87 | Temp 98.0°F | Ht 67.0 in | Wt 273.8 lb

## 2013-04-24 DIAGNOSIS — J449 Chronic obstructive pulmonary disease, unspecified: Secondary | ICD-10-CM

## 2013-04-24 NOTE — Patient Instructions (Signed)
No change in meds Continue to stay active followup with me in 6mos.

## 2013-04-24 NOTE — Assessment & Plan Note (Signed)
The patient has done well overall from a pulmonary standpoint since the last visit.  I've asked her to continue on her current regimen, and to work more aggressively on weight loss and some type of exercise program.  I will see her back in 6 months, but she is to call if having issues.

## 2013-04-24 NOTE — Progress Notes (Signed)
  Subjective:    Patient ID: Tina Hansen, female    DOB: [birth year], 72 y.o.   MRN: [id number]  HPI The patient comes in today for followup of her known COPD, secondary to asthma and emphysema.  She has really done well since her last visit, although did have issues during the summer with the high heat and humidity.  She has not had a flareup of her asthma since the last visit, nor has she required prednisone.  She feels that her exertional tolerance is at baseline.   Review of Systems  Constitutional: Negative for fever and unexpected weight change.  HENT: Negative for congestion, dental problem, ear pain, nosebleeds, postnasal drip, rhinorrhea, sinus pressure, sneezing, sore throat and trouble swallowing.   Eyes: Negative for redness and itching.  Respiratory: Positive for shortness of breath. Negative for cough, chest tightness and wheezing.   Cardiovascular: Negative for palpitations and leg swelling.  Gastrointestinal: Negative for nausea and vomiting.  Genitourinary: Negative for dysuria.  Musculoskeletal: Negative for joint swelling.  Skin: Negative for rash.  Neurological: Negative for headaches.  Hematological: Does not bruise/bleed easily.  Psychiatric/Behavioral: Negative for dysphoric mood. The patient is not nervous/anxious.        Objective:   Physical Exam Obese female in no acute distress Nose without purulence or discharge noted Neck without lymphadenopathy or thyromegaly Chest with mildly decreased breath sounds, no wheezing Cardiac exam with regular rate and rhythm Lower extremities with mild edema, no cyanosis Alert and oriented, moves all 4 extremities.       Assessment & Plan:

## 2013-05-24 ENCOUNTER — Encounter: Payer: Self-pay | Admitting: *Deleted

## 2013-10-23 ENCOUNTER — Ambulatory Visit (INDEPENDENT_AMBULATORY_CARE_PROVIDER_SITE_OTHER): Payer: Medicare HMO | Admitting: Pulmonary Disease

## 2013-10-23 ENCOUNTER — Encounter: Payer: Self-pay | Admitting: Pulmonary Disease

## 2013-10-23 VITALS — BP 142/92 | HR 83 | Temp 97.0°F | Ht 67.0 in | Wt 278.2 lb

## 2013-10-23 DIAGNOSIS — J449 Chronic obstructive pulmonary disease, unspecified: Secondary | ICD-10-CM

## 2013-10-23 NOTE — Patient Instructions (Signed)
Stay on symbicort twice a day as you are doing. Trial of spiriva respimat, 2 inhalations each am.  Call in 2 weeks with how things are going.  Try and stay as active as possible, and work on weight reduction. followup with me again in 42mos.

## 2013-10-23 NOTE — Progress Notes (Signed)
   Subjective:    Patient ID: Tina Hansen, female    DOB: [birth year], 73 y.o.   MRN: [id number]  HPI The patient comes in today for followup of her known COPD, felt secondary to both asthma and emphysema. She is maintained on Symbicort twice a day, and feels that her breathing is at her usual baseline. She has not had a flare up of her disease, nor has she had a recent pulmonary infection. She continues to have chronic dyspnea on exertion, but has not been working on weight loss or conditioning.   Review of Systems  Constitutional: Negative for fever and unexpected weight change.  HENT: Positive for congestion. Negative for dental problem, ear pain, nosebleeds, postnasal drip, rhinorrhea, sinus pressure, sneezing, sore throat and trouble swallowing.   Eyes: Negative for redness and itching.  Respiratory: Positive for cough and shortness of breath. Negative for chest tightness and wheezing.   Cardiovascular: Positive for leg swelling. Negative for palpitations.  Gastrointestinal: Negative for nausea and vomiting.  Genitourinary: Negative for dysuria.  Musculoskeletal: Negative for joint swelling.  Skin: Negative for rash.  Neurological: Negative for headaches.  Hematological: Does not bruise/bleed easily.  Psychiatric/Behavioral: Negative for dysphoric mood. The patient is not nervous/anxious.        Objective:   Physical Exam Obese female in no acute distress Nose without purulence or discharge noted Neck without lymphadenopathy or thyromegaly Chest with adequate airflow, no wheezing Cardiac exam with regular rate and rhythm Lower extremities with 1+ edema, no cyanosis Alert and oriented, moves all 4 extremities.       Assessment & Plan:

## 2013-10-23 NOTE — Assessment & Plan Note (Signed)
The patient is staying on Symbicort compliantly, and feels that her breathing is at baseline. He does not appear that she has ever been tried on an anti-cholinergic, but I suspect that is because we have been avoiding dry powder in the past which irritates her upper airway. Now that Spiriva is an an aerosol form, I would like to try her on this for a few weeks to see if she sees a difference. Ultimately, I think that her breathing will be most improved on weight loss and conditioning, as well as improved fluid balance.

## 2013-12-30 ENCOUNTER — Other Ambulatory Visit: Payer: Self-pay | Admitting: Pulmonary Disease

## 2014-04-02 ENCOUNTER — Other Ambulatory Visit: Payer: Self-pay | Admitting: Pulmonary Disease

## 2014-04-23 ENCOUNTER — Encounter: Payer: Self-pay | Admitting: Pulmonary Disease

## 2014-04-23 ENCOUNTER — Ambulatory Visit (INDEPENDENT_AMBULATORY_CARE_PROVIDER_SITE_OTHER): Payer: Medicare HMO | Admitting: Pulmonary Disease

## 2014-04-23 VITALS — BP 122/70 | HR 87 | Temp 98.0°F | Ht 67.0 in | Wt 279.4 lb

## 2014-04-23 DIAGNOSIS — J449 Chronic obstructive pulmonary disease, unspecified: Secondary | ICD-10-CM

## 2014-04-23 NOTE — Assessment & Plan Note (Signed)
The patient appears to be at baseline from a pulmonary standpoint. She is on Symbicort alone, and really did not see a big difference with the addition of an anticholinergic. I've asked her to continue on her current medication, and to call if she has increased rescue inhaler use. I've also stressed to her the importance of aggressive weight loss and working on some type of conditioning program.

## 2014-04-23 NOTE — Patient Instructions (Signed)
No change in current medications Try and stay active followup with me again in one year, but call if you are having increased breathing issues.

## 2014-04-23 NOTE — Progress Notes (Signed)
   Subjective:    Patient ID: ALGIE CALES, female    DOB: [birth year], 73 y.o.   MRN: [id number]  HPI Patient comes in today for follow-up of her known COPD, felt secondary to asthma with a component of emphysema. She was tried on Spiriva added to her Symbicort at the last visit, but did not see an appreciable difference in her symptoms. She is asking about using Spiriva as needed, but I explained this is a maintenance medication. She has not had any acute exacerbation or pulmonary infection since the last visit.   Review of Systems  Constitutional: Negative for fever and unexpected weight change.  HENT: Negative for congestion, dental problem, ear pain, nosebleeds, postnasal drip, rhinorrhea, sinus pressure, sneezing, sore throat and trouble swallowing.   Eyes: Negative for redness and itching.  Respiratory: Positive for cough. Negative for chest tightness, shortness of breath and wheezing.   Cardiovascular: Negative for palpitations and leg swelling.  Gastrointestinal: Negative for nausea and vomiting.  Genitourinary: Negative for dysuria.  Musculoskeletal: Negative for joint swelling.  Skin: Negative for rash.  Neurological: Negative for headaches.  Hematological: Does not bruise/bleed easily.  Psychiatric/Behavioral: Negative for dysphoric mood. The patient is not nervous/anxious.        Objective:   Physical Exam Morbidly obese female in no acute distress Nose without purulence or discharge noted Neck without lymphadenopathy or thyromegaly Chest totally clear to auscultation, no wheezing Cardiac exam with regular rate and rhythm Lower extremities with mild ankle edema, no cyanosis Alert and oriented, moves all 4 extremities.       Assessment & Plan:

## 2014-08-16 ENCOUNTER — Other Ambulatory Visit: Payer: Self-pay | Admitting: Pulmonary Disease

## 2014-09-19 ENCOUNTER — Other Ambulatory Visit: Payer: Self-pay | Admitting: Pulmonary Disease

## 2014-12-17 ENCOUNTER — Other Ambulatory Visit: Payer: Self-pay

## 2015-01-28 ENCOUNTER — Telehealth: Payer: Self-pay | Admitting: Pulmonary Disease

## 2015-01-28 MED ORDER — BUDESONIDE-FORMOTEROL FUMARATE 160-4.5 MCG/ACT IN AERO: INHALATION_SPRAY | RESPIRATORY_TRACT | Status: DC

## 2015-01-28 NOTE — Telephone Encounter (Signed)
Spoke with pt and is aware RX sent in. nothing further needed

## 2015-04-29 ENCOUNTER — Encounter: Payer: Self-pay | Admitting: Pulmonary Disease

## 2015-04-29 ENCOUNTER — Ambulatory Visit (INDEPENDENT_AMBULATORY_CARE_PROVIDER_SITE_OTHER)
Admission: RE | Admit: 2015-04-29 | Discharge: 2015-04-29 | Disposition: A | Discharge status: Home / Self Care | Payer: Medicare HMO | Source: Ambulatory Visit | Attending: Pulmonary Disease | Admitting: Pulmonary Disease

## 2015-04-29 ENCOUNTER — Ambulatory Visit (INDEPENDENT_AMBULATORY_CARE_PROVIDER_SITE_OTHER): Payer: Medicare HMO | Admitting: Pulmonary Disease

## 2015-04-29 ENCOUNTER — Ambulatory Visit: Payer: Medicare HMO | Admitting: Pulmonary Disease

## 2015-04-29 VITALS — BP 132/78 | HR 88 | Temp 98.3°F | Wt 263.2 lb

## 2015-04-29 DIAGNOSIS — I10 Essential (primary) hypertension: Secondary | ICD-10-CM

## 2015-04-29 DIAGNOSIS — R0600 Dyspnea, unspecified: Secondary | ICD-10-CM

## 2015-04-29 DIAGNOSIS — J449 Chronic obstructive pulmonary disease, unspecified: Secondary | ICD-10-CM

## 2015-04-29 DIAGNOSIS — M0579 Rheumatoid arthritis with rheumatoid factor of multiple sites without organ or systems involvement: Secondary | ICD-10-CM | POA: Insufficient documentation

## 2015-04-29 DIAGNOSIS — M069 Rheumatoid arthritis, unspecified: Secondary | ICD-10-CM

## 2015-04-29 DIAGNOSIS — E669 Obesity, unspecified: Secondary | ICD-10-CM

## 2015-04-29 DIAGNOSIS — J383 Other diseases of vocal cords: Secondary | ICD-10-CM

## 2015-04-29 MED ORDER — ALBUTEROL SULFATE (2.5 MG/3ML) 0.083% IN NEBU: INHALATION_SOLUTION | RESPIRATORY_TRACT | Status: DC

## 2015-04-29 MED ORDER — ALBUTEROL SULFATE HFA 108 (90 BASE) MCG/ACT IN AERS: INHALATION_SPRAY | RESPIRATORY_TRACT | Status: DC

## 2015-04-29 MED ORDER — BUDESONIDE-FORMOTEROL FUMARATE 160-4.5 MCG/ACT IN AERO: INHALATION_SPRAY | RESPIRATORY_TRACT | Status: DC

## 2015-04-29 NOTE — Progress Notes (Signed)
Subjective:     Patient ID: Tina Hansen, female   DOB: Apr 20, 1941, 74 y.o.   MRN: [id number]  HPI ~  10/06/07:  Initial pulmonary consultation by KC> Referred by: Freda Munro Chief Complaint: Pulmonary Consult. History of Present Illness: The patient is a 74 year old female who I have been asked to see for the evaluation of asthma. The patient was diagnosed with asthma in 1992, and was started on Advair in 1999. The patient states that her symptoms have been extremely severe over the last 5 years, and she uses albuterol rescue two to 5 times a day, every day. The patient admits that she does not sit down to rest when she becomes dyspneic prior to using her albuterol inhaler. The patient denies significant cough, and has only had reflux symptoms on rare occasions. She denies any chronic nasal symptoms, and has no significant allergy issues as well. She will get dyspneic with mild to moderate activity. It should also be noted, the patient has had extreme hoarseness for years. She has been on lisinopril during this time. She does feel that her throat closes in on numerous occasions, and describes the rapid onset of severe dyspnea without warning. Current Allergies: ! THEOPHYLLINE Past Medical History:  CERVICAL CANCER, HX OF (ICD-V10.41)  HYPERTENSION (ICD-401.9)  HYPERLIPIDEMIA (ICD-272.4)  ASTHMA (ICD-493.90) Past Surgical History:  status post hysterectomy Impression & Recommendations: Problem # 1: ASTHMA (ICD-493.90) the patient has severe airflow obstruction by spirometry today in the office. She may have an element of asthma, but I also wonder about emphysema. I think it would be worthwhile adding a trial of Spiriva to her regimen. I would also like to get records from her old pulmonologist to compare prior PFTs. I think the patient is overusing albuterol, and I have stressed to her the need to sit down and rest prior to using the inhaler. A lot of her dyspnea  on exertion may be due to her morbid obesity and deconditioning. Problem # 2: OTHER DISEASES OF VOCAL CORDS (ICD-478.5) the patient has a classic history for vocal cord dysfunction. She is extremely hoarse, and describes the sudden onset of dyspnea with symptoms that are suggestive of laryngospasm. I have tried to explain to her that bronchodilators will not help with this issue. The patient is on lisinopril which we should definitely discontinue. She is also having difficulty with the dry powder from Advair, and therefore will initiate treatment with symbicort through a spacer. I have also demonstrated for her purse lip breathing, which often helps during an attack of vocal cord dysfunction. The patient denies difficulty with reflux disease, but I wonder if this is more of an issue and she suspects. Medications Added to Medication List This Visit: 1) Lisinopril 20 Mg Tabs (Lisinopril) .... Take 1 tablet by mouth once a day 2) Advair Diskus 250-50 Mcg/dose Misc (Fluticasone-salmeterol) .... Inhale 1 puff two times a day 3) Albuterol 90 Mcg/act Aers (Albuterol) .... Inhale 2 puffs every 4 hours as needed 4) Albuterol Neb .... Use as needed 5) Multivitamins Tabs (Multiple vitamin) .... Take 1 tablet by mouth once a day 6) Iodine Liquid .... Put 5 drops in a liquid and drink once daily 7) Fish Oil 1000 Mg Caps (Omega-3 fatty acids) .... Take 2 tabs by mouth once daily 8) Lecithin 2000mg  .... Take 1 tablet by mouth once a day       LAST 2 office visits:   ~  10/23/13:  6 month ROV w/ KC>  The patient comes in today for followup of her known COPD, felt secondary to both asthma and emphysema. She is maintained on Symbicort twice a day, and feels that her breathing is at her usual baseline. She has not had a flare up of her disease, nor has she had a recent pulmonary infection. She continues to have chronic dyspnea on exertion, but has not been working on weight loss or conditioning.       REC>  The patient is staying on Symbicort compliantly, and feels that her breathing is at baseline. He does not appear that she has ever been tried on an anti-cholinergic, but I suspect that is because we have been avoiding dry powder in the past which irritates her upper airway. Now that Spiriva is an an aerosol form, I would like to try her on this for a few weeks to see if she sees a difference. Ultimately, I think that her breathing will be most improved on weight loss and conditioning, as well as improved fluid balance.   ~  04/23/14:  6 month ROV w/ KC>        Patient comes in today for follow-up of her known COPD, felt secondary to asthma with a component of emphysema. She was tried on Spiriva added to her Symbicort at the last visit, but did not see an appreciable difference in her symptoms. She is asking about using Spiriva as needed, but I explained this is a maintenance medication. She has not had any acute exacerbation or pulmonary infection since the last visit.      REC>  The patient appears to be at baseline from a pulmonary standpoint. She is on Symbicort alone, and really did not see a big difference with the addition of an anticholinergic. I've asked her to continue on her current medication, and to call if she has increased rescue inhaler use. I've also stressed to her the importance of aggressive weight loss and working on some type of conditioning program.  ~  April 29, 2015:  18yr ROV w/ SN>  PCP- Dr. Tamsen Roers in [place]; Rheum- DrDeveshwar, prev ENT- DrByers...      74 y/o WF w/ hx COPD (emphysema & a rev component), VCD, obesity, RA... She has been controlled on Symbicort160, Albuterol NEBS and HFA but she is pretty set in her ways and only uses her meds as she feels she needs them- eg Symbicort every other day, AlbutHFA w/ activity, & Albut NEB w/ URIs;  She notes that leaves and molds "get me good" and so does burning outside & the smell of soap in the grocery store has a  similar effect;  She is content w/ this regiumen and wants to continue same...      She is an ex-smoker> from teens to her 79s up to 1.5ppd for ~40 pack-yr smoking hx & quit in 1987;  She claims she got asthma after she quit, but had no prob while smoking;  She's had pneumonia in the past;  She has a hx hoarseness & dx w/ muscle tension dysphonia/ VCD per DrByers;        She takes DiovanHCT for BP & MTX for the RA... EXAM shows Afeb, VSS, O2sat=96% on RA;  Wt=263#, BMI=42;  HEENT- neg, mallampati2;  Chest- decr BS, clear w/o w/r/r;  Heart- RR w/o m/r/g;  Abd- obese w/ panniculus, soft, neg;  Ext- on MTX per DrDeveshwar for RA, VI, tr edema, no c/c...  CXR 04/29/15>  Hyperinflated, sl flattening of  diaph & incr in AP diameter, norm heart size, clear- NAD, DJD Tspine w/ osteophytes...  Spirometry 04/29/15>  FVC=2.05 (65%), FEV1=1.01 (43%), %1sec=49, mid-flows reduced at 30% predicted;  This is c/w SEVERE airflow obstruction and GOLD Stage3 COPD...  Ambulatory Oximetry 04/29/15>  O2sat=97% on RA at rest w/ pulse=81/min;  She ambulated 3 Laps in the office (185'ea) w/ lowest O2sat=90% w/ pulse=109/min IMP/PLAN>>  [place]ois has severe COPD and would be better served by taking her meds regularly- ie Symbicort160- 2spBid;  She has Albut both via NEB & HFA inhaler to use prn for wheezing/ SOB/ etc;  We may need to add an anticholinergic but she does not want additional meds at this time;  Ultimately her breathing would improve w/ diet/ exercise/ wt reduction!    Past Medical History  Diagnosis Date  . Cervical cancer ([place])   . Hypertension   . Hyperlipidemia   . Asthma   . Rheumatoid arthritis(714.0)     [name]    Past Surgical History  Procedure Laterality Date  . Abdominal hysterectomy    . Aortic valve replacement      Outpatient Encounter Prescriptions as of 04/29/2015  Medication Sig  . albuterol (PROAIR HFA) 108 (90 BASE) MCG/ACT inhaler INHALE TWO PUFFS BY MOUTH EVERY 6 HOURS AS NEEDED  .  albuterol (PROVENTIL) (2.5 MG/3ML) 0.083% nebulizer solution USE ONE VIAL IN NEBULIZER EVERY 6 HOURS AS NEEDED  . Biotin 5000 MCG CAPS Take 10,000 mcg by mouth daily.  . budesonide-formoterol (SYMBICORT) 160-4.5 MCG/ACT inhaler INHALE TWO PUFFS BY  MOUTH ONCE TO TWICE DAILY  . calcium gluconate 500 MG tablet Take 500 mg by mouth daily.    . Chromium 1 MG CAPS Take 1 mg by mouth daily. Increase to 2 caps if needed  . Coenzyme Q10 (COQ-10) 100 MG CAPS Take 1 capsule by mouth daily.    . fish oil-omega-3 fatty acids 1000 MG capsule Take 2 capsules by mouth daily.    . folic acid (FOLVITE) 1 MG tablet Take 1 mg by mouth daily.    . IODINE, KELP, PO Put 5 drops in a liquid and drink once daily.  . Methotrexate Sodium (METHOTREXATE PO) (unsure of dosage) once weekly injection   . Milk Thistle 200 MG CAPS Take 400 mg by mouth daily.    . Multiple Vitamin (MULTIVITAMIN) capsule Take 1 capsule by mouth daily.    . valsartan-hydrochlorothiazide (DIOVAN-HCT) 80-12.5 MG per tablet Take 1 tablet by mouth daily.    . vitamin E (VITAMIN E) 400 UNIT capsule Take 400 Units by mouth daily.  . [DISCONTINUED] albuterol (PROVENTIL) (2.5 MG/3ML) 0.083% nebulizer solution USE ONE VIAL IN NEBULIZER EVERY 6 HOURS AS NEEDED  . [DISCONTINUED] budesonide-formoterol (SYMBICORT) 160-4.5 MCG/ACT inhaler INHALE TWO PUFFS BY  MOUTH ONCE TO TWICE DAILY  . [DISCONTINUED] PROAIR HFA 108 (90 BASE) MCG/ACT inhaler INHALE TWO PUFFS BY MOUTH EVERY 6 HOURS AS NEEDED   No facility-administered encounter medications on file as of 04/29/2015.    Allergies  Allergen Reactions  . Other     Lamisil  . Theophylline     REACTION: fever    Immunization History  Administered Date(s) Administered  . Pneumococcal Polysaccharide-23 06/27/2007, 03/22/2009  SHE NEEDS TO BE SURE THAT SHE IS UP-TO-DATE ON HER VACCINES...   Family History  Problem Relation Age of Onset  . Asthma Mother   . Breast cancer Mother   Mother developed ASTHMA  while touring in [place] & never got over it according to the  pt...   Social History   Social History  . Marital Status: Divorced    Spouse Name: N/A  . Number of Children: Y  . Years of Education: N/A   Occupational History  . retired    Social History Main Topics  . Smoking status: Former Smoker -- 1.00 packs/day for 30 years    Types: Cigarettes    Quit date: 06/22/1985  . Smokeless tobacco: Not on file  . Alcohol Use: Not on file  . Drug Use: Not on file  . Sexual Activity: Not on file   Other Topics Concern  . Not on file   Social History Narrative    Current Medications, Allergies, Past Medical History, Past Surgical History, Family History, and Social History were reviewed in [employer] record.   Review of Systems             All symptoms NEG except where BOLDED >>  Constitutional:  F/C/S, fatigue, anorexia, unexpected weight change. HEENT:  HA, visual changes, hearing loss, earache, nasal symptoms, sore throat, mouth sores, hoarseness. Resp:  cough, sputum, hemoptysis; SOB, tightness, wheezing. Cardio:  CP, palpit, DOE, orthopnea, edema. GI:  N/V/D/C, blood in stool; reflux, abd pain, distention, gas. GU:  dysuria, freq, urgency, hematuria, flank pain, voiding difficulty. MS:  joint pain, swelling, tenderness, decr ROM; neck pain, back pain, etc. Neuro:  HA, tremors, seizures, dizziness, syncope, weakness, numbness, gait abn. Skin:  suspicious lesions or skin rash. Heme:  adenopathy, bruising, bleeding. Psyche:  confusion, agitation, sleep disturbance, hallucinations, anxiety, depression suicidal.   Objective:   Physical Exam       Vital Signs:  Reviewed...   General:  WD, WN, 74 y/o WF in NAD; alert & oriented; pleasant & cooperative... HEENT:  [place]/AT; Conjunctiva- pink, Sclera- nonicteric, EOM-wnl, PERRLA, EACs-clear, TMs-wnl; NOSE-clear; THROAT-clear & wnl.  Neck:  Supple w/ fair ROM; no JVD; normal carotid impulses w/o bruits; no  thyromegaly or nodules palpated; no lymphadenopathy.  Chest:  Clear to P & A; decr BS bilat, without wheezes/ rales/ or rhonchi heard. Heart:  Regular Rhythm; norm S1 & S2 without murmurs, rubs, or gallops detected. Abdomen:  Soft & nontender, obese 2w/ panniculus- no guarding or rebound; normal bowel sounds; no organomegaly or masses palpated. Ext:  Decr ROM; without deformities +arthritic changes; no varicose veins, +venous insuffic, tr edema;  Pulses intact w/o bruits. Neuro:  CNs II-XII intact; motor testing normal; sensory testing normal; gait normal & balance OK. Derm:  No lesions noted; no rash etc. Lymph:  No cervical, supraclavicular, axillary, or inguinal adenopathy palpated.   Assessment:      IMP>>     COPD w/ reversible component> on Symbicort160-2spBid, Albuterol by NEB & HFA inhaler as needed...    Ex-smoker, quit 1987    VCD, muscle tension dysphonia> prev eval by DrByers    Medical issues>  HBP, HL, Obesity, hx cervical cancer, RA, anxiety  PLAN>>      Imonie has severe COPD and would be better served by taking her meds regularly- ie Symbicort160- 2spBid;  She has Albut both via NEB & HFA inhaler to use prn for wheezing/ SOB/ etc;  We may need to add an anticholinergic but she does not want additional meds at this time;  Ultimately her breathing would improve w/ diet/ exercise/ wt reduction!     Plan:     Patient's Medications  New Prescriptions   No medications on file  Previous Medications   BIOTIN 5000 MCG CAPS  Take 10,000 mcg by mouth daily.   CALCIUM GLUCONATE 500 MG TABLET    Take 500 mg by mouth daily.     CHROMIUM 1 MG CAPS    Take 1 mg by mouth daily. Increase to 2 caps if needed   COENZYME Q10 (COQ-10) 100 MG CAPS    Take 1 capsule by mouth daily.     FISH OIL-OMEGA-3 FATTY ACIDS 1000 MG CAPSULE    Take 2 capsules by mouth daily.     FOLIC ACID (FOLVITE) 1 MG TABLET    Take 1 mg by mouth daily.     IODINE, KELP, PO    Put 5 drops in a liquid and drink  once daily.   METHOTREXATE SODIUM (METHOTREXATE PO)    (unsure of dosage) once weekly injection    MILK THISTLE 200 MG CAPS    Take 400 mg by mouth daily.     MULTIPLE VITAMIN (MULTIVITAMIN) CAPSULE    Take 1 capsule by mouth daily.     VALSARTAN-HYDROCHLOROTHIAZIDE (DIOVAN-HCT) 80-12.5 MG PER TABLET    Take 1 tablet by mouth daily.     VITAMIN E (VITAMIN E) 400 UNIT CAPSULE    Take 400 Units by mouth daily.  Modified Medications   Modified Medication Previous Medication   ALBUTEROL (PROAIR HFA) 108 (90 BASE) MCG/ACT INHALER PROAIR HFA 108 (90 BASE) MCG/ACT inhaler      INHALE TWO PUFFS BY MOUTH EVERY 6 HOURS AS NEEDED    INHALE TWO PUFFS BY MOUTH EVERY 6 HOURS AS NEEDED   ALBUTEROL (PROVENTIL) (2.5 MG/3ML) 0.083% NEBULIZER SOLUTION albuterol (PROVENTIL) (2.5 MG/3ML) 0.083% nebulizer solution      USE ONE VIAL IN NEBULIZER EVERY 6 HOURS AS NEEDED    USE ONE VIAL IN NEBULIZER EVERY 6 HOURS AS NEEDED   BUDESONIDE-FORMOTEROL (SYMBICORT) 160-4.5 MCG/ACT INHALER budesonide-formoterol (SYMBICORT) 160-4.5 MCG/ACT inhaler      INHALE TWO PUFFS BY  MOUTH ONCE TO TWICE DAILY    INHALE TWO PUFFS BY  MOUTH ONCE TO TWICE DAILY  Discontinued Medications   No medications on file

## 2015-04-29 NOTE — Patient Instructions (Signed)
Today we updated your med list in our EPIC system...    Continue your current medications the same...    We refilled your Symbicort inhaler and your Albuterol prescriptions...  Today we checked a follow up CXR, Spirometry breathing test and an ambulatory oxygen saturation test...    We will contact you w/ the results when available...   Work on weight reduction as this will help your breathing, and stay as active as possible...  Call for any questions...  Let's plan a follow up visit in 35yr, sooner if needed for acute problems.Marland KitchenMarland Kitchen

## 2015-06-20 ENCOUNTER — Telehealth: Payer: Self-pay | Admitting: Pulmonary Disease

## 2015-06-20 NOTE — Telephone Encounter (Signed)
Pt states that Medicare will not be covering symbicort in 2017, only advair.  Pt states she has tried and failed advair in the past-caused increased fatigue, sob.   Pt uses [facility] on [address]. Called pharmacy, states that we cannot initiate a formulary exception at this time because symbicort is still on her formulary.  Will hold message until the new year to initiate formulary exception.  Pt is aware of this.

## 2015-06-25 NOTE — Telephone Encounter (Signed)
Called [facility] to obtain Formulary Exception form for Symbicort.   Spoke with Alice at [facility], she is faxing the form to [place] awaiting receipt of form to start PA for Symbicort.

## 2015-06-26 NOTE — Telephone Encounter (Signed)
Fax received for PA Key: [id number] PA started through [facility].  Will forward to [place] to f/u on

## 2015-06-27 NOTE — Telephone Encounter (Signed)
Checked PA on cover my meds. A decision has not been made yet.

## 2015-06-28 NOTE — Telephone Encounter (Signed)
Checked PA on CMM and still in process

## 2015-06-28 NOTE — Telephone Encounter (Signed)
Received fax stating [place] has been approved until 12.31.17. Called and informed pt and the pharmacy. Pt verbalized understanding and the pharmacy stated she cannot refill the medicaiton until 1.14.17. Pt has enough Symbicort to last until 1.14.17. Nothing further needed at this time.

## 2015-08-07 ENCOUNTER — Other Ambulatory Visit: Payer: Self-pay | Admitting: Pulmonary Disease

## 2015-10-16 ENCOUNTER — Other Ambulatory Visit: Payer: Self-pay | Admitting: Pulmonary Disease

## 2016-04-14 ENCOUNTER — Telehealth: Payer: Self-pay | Admitting: Radiology

## 2016-04-14 NOTE — Telephone Encounter (Signed)
I have called patient to advise labs are normal  

## 2016-04-25 DIAGNOSIS — Z79899 Other long term (current) drug therapy: Secondary | ICD-10-CM | POA: Insufficient documentation

## 2016-04-26 NOTE — Progress Notes (Deleted)
*IMAGE* Office Visit Note  Patient: Tina Hansen             Date of Birth: 04-01-1941           MRN: [id number]             PCP: Tamsen Roers, MD Referring: Tamsen Roers, MD Visit Date: 04/27/2016 Occupation:@GUAROCC @    Subjective:  No chief complaint on file. Followup on rheumatoid arthritis, high-risk prescription.   History of Present Illness: Tina Hansen is a 75 y.o. female . On last visit, 01/23/2016 , patient's history of present illness was:   Last seen in our office on Oct 21, 2015.  The patient presents today to discuss her rheumatoid arthritis and to recheck how well she is responding to her methotrexate.  She was having some hair loss when she was on 0.4 mL of methotrexate, but at 0.3 mL it has stabilized and her hair loss is not significant, but some is still present.  Overall, her joints are doing well despite being on a lower dose of methotrexate.  She seems to be happy with this.  On her last visit, she had an upper respiratory infection that was about 1-1/2 weeks present and she reports that particular upper respiratory infection indeed lasted for 3 weeks.  She did recover.   On last visit, the patient's impression/plan work: IMPRESSION/PLAN:   1.    RA.  Positive rheumatoid factor, CCP.  No joint pain, swelling or stiffness.  No synovitis.  2.    High-risk prescription.  On methotrexate 0.3 mL per week doing well.  No significant hair loss.  Folic acid 1 per day.  3.    Upper respiratory infection x3 weeks.  No meds, no followups with doctors for this.  She recovered on her own after 3 weeks. 4.    Left hip joint pain with left worse than the right.  Will use Voltaren gel.  5.    Bilateral shoulder joint pain.  History of rotator cuff discomfort injury doing backstroke years ago.  6.    Refill on folic acid 1 daily, [id number] supply with 4 refills.  7.    Refill on methotrexate 0.8 mL, 10 mL with no refills.  8.    [email or web address] card. 9.    Voltaren gel 1 tube with 1  refill. 10.  Return to clinic in 5 months.    Today, the patient states:***   Activities of Daily Living:  Patient reports morning stiffness for 15 minutes.   Patient Reports nocturnal pain.  Difficulty dressing/grooming: Reports Difficulty climbing stairs: Reports Difficulty getting out of chair: Reports Difficulty using hands for taps, buttons, cutlery, and/or writing: Reports   Review of Systems  Constitutional: Negative for fatigue.  HENT: Negative for mouth sores and mouth dryness.   Eyes: Negative for dryness.  Respiratory: Negative for shortness of breath.   Gastrointestinal: Negative for constipation and diarrhea.  Musculoskeletal: Positive for morning stiffness (66mins). Negative for arthralgias (no joint pain, stiffness, swelling from RA), joint pain (no joint pain, stiffness, swelling from RA), myalgias and myalgias.  Skin: Negative for sensitivity to sunlight.  Psychiatric/Behavioral: Negative for decreased concentration and sleep disturbance.   On last visit patient's review of systems were:    REVIEW OF SYSTEMS:  Systemic review is significant for pain and tenderness of the left greater trochanteric bursa, no dry eyes or dry mouth, no disturbed sleep pattern or fatigue, no Raynaud, mucosal ulcers, lymphadenopathy, other rashes, photosensitivity, no diarrhea, no constipation,  some morning stiffness, occasional trouble with stairs, chair, car, toilet as well as taps, buttons, cutlery, and writing.  The rest of the systems are negative.  Please refer to the rheumatology visit form for details.      PMFS History:  Patient Active Problem List   Diagnosis Date Noted  . High risk medication use 04/25/2016  . Vocal cord dysfunction 04/29/2015  . Obesity 04/29/2015  . Seropositive rheumatoid arthritis of multiple sites ([place]) 04/29/2015  . COPD (chronic obstructive pulmonary disease) ([place]) 10/20/2010  . HYPERLIPIDEMIA 10/06/2007  . Essential hypertension 10/06/2007  .  OTHER DISEASES OF VOCAL CORDS 10/06/2007  . CERVICAL CANCER, HX OF 10/06/2007    Past Medical History:  Diagnosis Date  . Asthma   . Cervical cancer ([place])   . Hyperlipidemia   . Hypertension   . Rheumatoid arthritis(714.0)    [name]    Family History  Problem Relation Age of Onset  . Asthma Mother   . Breast cancer Mother    Past Surgical History:  Procedure Laterality Date  . ABDOMINAL HYSTERECTOMY    . AORTIC VALVE REPLACEMENT     Social History   Social History Narrative  . No narrative on file    SOCIAL HISTORY:  The patient is a former smoker, quit in 1997; drinks alcohol 1 or 2 times per month, drinks tea and coffee and exercises some.   CURRENT MEDICATIONS:  Diovan/hydrochlorothiazide, Symbicort, methotrexate 0.3 mL per week, folic acid, supplements.    MEDICATION ALLERGIES:  THEOPHYLLINE, LAMISIL, NAPROXEN.   Objective: Vital Signs: There were no vitals taken for this visit.   Physical Exam  Constitutional: She is oriented to person, place, and time. She appears well-developed and well-nourished.  HENT:  Head: Normocephalic and atraumatic.  Eyes: EOM are normal. Pupils are equal, round, and reactive to light.  Cardiovascular: Normal rate, regular rhythm and normal heart sounds.  Exam reveals no gallop and no friction rub.   No murmur heard. Pulmonary/Chest: Effort normal and breath sounds normal. She has no wheezes. She has no rales.  Abdominal: Soft. Bowel sounds are normal. She exhibits no distension. There is no tenderness. There is no guarding. No hernia.  Musculoskeletal: Normal range of motion. She exhibits no edema, tenderness or deformity.  Lymphadenopathy:    She has no cervical adenopathy.  Neurological: She is alert and oriented to person, place, and time. Coordination normal.  Skin: Skin is warm and dry. Capillary refill takes less than 2 seconds. No rash noted.  Psychiatric: She has a normal mood and affect. Her behavior is normal.  Nursing  note and vitals reviewed.    Musculoskeletal Exam:  Full range of motion of all joints Grip strength is equal and strong bilaterally Fibromyalgia tender points are all absent ***  CDAI Exam: No CDAI exam completed.  No synovitis on examination  Investigation: Findings:   ============================== INVESTIGATIONS:  Labs from December 18, 2015, show CMP with GFR normal.  CBC with diff normal.  RAPID3 shows a raw score of 3.0 with an index of 1.0, which is consistent with near remission.  ==============================    Imaging: No results found.  Speciality Comments: No specialty comments available.    Procedures:  No procedures performed Allergies: Other and Theophylline   Assessment / Plan: Visit Diagnoses: Seropositive rheumatoid arthritis of multiple sites ([place]) - +RF, +CCP  High risk medication use - MTX 0000000 EVERY WEEK; FOLIC ACID 1mg  every day;  Pain of both shoulder joints  Greater trochanteric bursitis of  left hip    Orders: No orders of the defined types were placed in this encounter.  No orders of the defined types were placed in this encounter.   Face-to-face time spent with patient was 30 minutes. 50% of time was spent in counseling and coordination of care.  Follow-Up Instructions: No Follow-up on file.

## 2016-04-27 ENCOUNTER — Ambulatory Visit: Payer: Self-pay | Admitting: Rheumatology

## 2016-04-27 ENCOUNTER — Other Ambulatory Visit: Payer: Self-pay | Admitting: Pulmonary Disease

## 2016-05-04 ENCOUNTER — Ambulatory Visit (INDEPENDENT_AMBULATORY_CARE_PROVIDER_SITE_OTHER): Payer: Medicare HMO | Admitting: Pulmonary Disease

## 2016-05-04 ENCOUNTER — Encounter: Payer: Self-pay | Admitting: Pulmonary Disease

## 2016-05-04 VITALS — BP 128/84 | HR 79 | Temp 97.8°F | Ht 66.5 in | Wt 266.1 lb

## 2016-05-04 DIAGNOSIS — J383 Other diseases of vocal cords: Secondary | ICD-10-CM

## 2016-05-04 DIAGNOSIS — E662 Morbid (severe) obesity with alveolar hypoventilation: Secondary | ICD-10-CM | POA: Insufficient documentation

## 2016-05-04 DIAGNOSIS — E66813 Obesity, class 3: Secondary | ICD-10-CM | POA: Insufficient documentation

## 2016-05-04 DIAGNOSIS — I10 Essential (primary) hypertension: Secondary | ICD-10-CM

## 2016-05-04 DIAGNOSIS — Z6841 Body Mass Index (BMI) 40.0 and over, adult: Secondary | ICD-10-CM | POA: Insufficient documentation

## 2016-05-04 DIAGNOSIS — M0579 Rheumatoid arthritis with rheumatoid factor of multiple sites without organ or systems involvement: Secondary | ICD-10-CM

## 2016-05-04 DIAGNOSIS — J449 Chronic obstructive pulmonary disease, unspecified: Secondary | ICD-10-CM

## 2016-05-04 MED ORDER — ALBUTEROL SULFATE HFA 108 (90 BASE) MCG/ACT IN AERS: INHALATION_SPRAY | RESPIRATORY_TRACT | 11 refills | Status: DC

## 2016-05-04 MED ORDER — BUDESONIDE-FORMOTEROL FUMARATE 160-4.5 MCG/ACT IN AERO: INHALATION_SPRAY | RESPIRATORY_TRACT | 11 refills | Status: DC

## 2016-05-04 NOTE — Progress Notes (Signed)
Subjective:     Patient ID: Tina Hansen, female   DOB: April 08, 1941, 75 y.o.   MRN: [id number]  HPI  ~  10/06/07:  Initial pulmonary consultation by KC> Referred by: Freda Munro Chief Complaint: Pulmonary Consult. History of Present Illness: The patient is a 75 year old female who I have been asked to see for the evaluation of asthma. The patient was diagnosed with asthma in 1992, and was started on Advair in 1999. The patient states that her symptoms have been extremely severe over the last 5 years, and she uses albuterol rescue two to 5 times a day, every day. The patient admits that she does not sit down to rest when she becomes dyspneic prior to using her albuterol inhaler. The patient denies significant cough, and has only had reflux symptoms on rare occasions. She denies any chronic nasal symptoms, and has no significant allergy issues as well. She will get dyspneic with mild to moderate activity. It should also be noted, the patient has had extreme hoarseness for years. She has been on lisinopril during this time. She does feel that her throat closes in on numerous occasions, and describes the rapid onset of severe dyspnea without warning. Current Allergies: ! THEOPHYLLINE Past Medical History:  CERVICAL CANCER, HX OF (ICD-V10.41)  HYPERTENSION (ICD-401.9)  HYPERLIPIDEMIA (ICD-272.4)  ASTHMA (ICD-493.90) Past Surgical History:  status post hysterectomy Impression & Recommendations: Problem # 1: ASTHMA (ICD-493.90) the patient has severe airflow obstruction by spirometry today in the office. She may have an element of asthma, but I also wonder about emphysema. I think it would be worthwhile adding a trial of Spiriva to her regimen. I would also like to get records from her old pulmonologist to compare prior PFTs. I think the patient is overusing albuterol, and I have stressed to her the need to sit down and rest prior to using the inhaler. A lot of her  dyspnea on exertion may be due to her morbid obesity and deconditioning. Problem # 2: OTHER DISEASES OF VOCAL CORDS (ICD-478.5) the patient has a classic history for vocal cord dysfunction. She is extremely hoarse, and describes the sudden onset of dyspnea with symptoms that are suggestive of laryngospasm. I have tried to explain to her that bronchodilators will not help with this issue. The patient is on lisinopril which we should definitely discontinue. She is also having difficulty with the dry powder from Advair, and therefore will initiate treatment with symbicort through a spacer. I have also demonstrated for her purse lip breathing, which often helps during an attack of vocal cord dysfunction. The patient denies difficulty with reflux disease, but I wonder if this is more of an issue and she suspects. Medications Added to Medication List This Visit: 1) Lisinopril 20 Mg Tabs (Lisinopril) .... Take 1 tablet by mouth once a day 2) Advair Diskus 250-50 Mcg/dose Misc (Fluticasone-salmeterol) .... Inhale 1 puff two times a day 3) Albuterol 90 Mcg/act Aers (Albuterol) .... Inhale 2 puffs every 4 hours as needed 4) Albuterol Neb .... Use as needed 5) Multivitamins Tabs (Multiple vitamin) .... Take 1 tablet by mouth once a day 6) Iodine Liquid .... Put 5 drops in a liquid and drink once daily 7) Fish Oil 1000 Mg Caps (Omega-3 fatty acids) .... Take 2 tabs by mouth once daily 8) Lecithin 2000mg  .... Take 1 tablet by mouth once a day       LAST 2 office visits:   ~  10/23/13:  6 month ROV w/ KC>  The patient comes in today for followup of her known COPD, felt secondary to both asthma and emphysema. She is maintained on Symbicort twice a day, and feels that her breathing is at her usual baseline. She has not had a flare up of her disease, nor has she had a recent pulmonary infection. She continues to have chronic dyspnea on exertion, but has not been working on weight loss or  conditioning.      REC>  The patient is staying on Symbicort compliantly, and feels that her breathing is at baseline. He does not appear that she has ever been tried on an anti-cholinergic, but I suspect that is because we have been avoiding dry powder in the past which irritates her upper airway. Now that Spiriva is an an aerosol form, I would like to try her on this for a few weeks to see if she sees a difference. Ultimately, I think that her breathing will be most improved on weight loss and conditioning, as well as improved fluid balance.   ~  04/23/14:  6 month ROV w/ KC>        Patient comes in today for follow-up of her known COPD, felt secondary to asthma with a component of emphysema. She was tried on Spiriva added to her Symbicort at the last visit, but did not see an appreciable difference in her symptoms. She is asking about using Spiriva as needed, but I explained this is a maintenance medication. She has not had any acute exacerbation or pulmonary infection since the last visit.      REC>  The patient appears to be at baseline from a pulmonary standpoint. She is on Symbicort alone, and really did not see a big difference with the addition of an anticholinergic. I've asked her to continue on her current medication, and to call if she has increased rescue inhaler use. I've also stressed to her the importance of aggressive weight loss and working on some type of conditioning program.   ~  April 29, 2015:  4yr ROV w/ SN>  PCP- Dr. Tamsen Roers in [place]; Rheum- DrDeveshwar, prev ENT- DrByers...      75 y/o WF w/ hx COPD (emphysema & a rev component), VCD, obesity, RA... She has been controlled on Symbicort160, Albuterol NEBS and HFA but she is pretty set in her ways and only uses her meds as she feels she needs them- eg Symbicort every other day, AlbutHFA w/ activity, & Albut NEB w/ URIs;  She notes that leaves and molds "get me good" and so does burning outside & the smell of soap in the  grocery store has a similar effect;  She is content w/ this regiumen and wants to continue same...      She is an ex-smoker> from teens to her 2s up to 1.5ppd for ~40 pack-yr smoking hx & quit in 1987;  She claims she got asthma after she quit, but had no prob while smoking;  She's had pneumonia in the past;  She has a hx hoarseness & dx w/ muscle tension dysphonia/ VCD per DrByers;        She takes DiovanHCT for BP & MTX for the RA... EXAM shows Afeb, VSS, O2sat=96% on RA;  Wt=263#, BMI=42;  HEENT- neg, mallampati2;  Chest- decr BS, clear w/o w/r/r;  Heart- RR w/o m/r/g;  Abd- obese w/ panniculus, soft, neg;  Ext- on MTX per DrDeveshwar for RA, VI, tr edema, no c/c...  CXR 04/29/15>  Hyperinflated, sl flattening  of diaph & incr in AP diameter, norm heart size, clear- NAD, DJD Tspine w/ osteophytes...  Spirometry 04/29/15>  FVC=2.05 (65%), FEV1=1.01 (43%), %1sec=49, mid-flows reduced at 30% predicted;  This is c/w SEVERE airflow obstruction and GOLD Stage3 COPD...  Ambulatory Oximetry 04/29/15>  O2sat=97% on RA at rest w/ pulse=81/min;  She ambulated 3 Laps in the office (185'ea) w/ lowest O2sat=90% w/ pulse=109/min IMP/PLAN>>  Tina Hansen has severe COPD and would be better served by taking her meds regularly- ie Symbicort160- 2spBid;  She has Albut both via NEB & HFA inhaler to use prn for wheezing/ SOB/ etc;  We may need to add an anticholinergic but she does not want additional meds at this time;  Ultimately her breathing would improve w/ diet/ exercise/ wt reduction!  ~  May 04, 2016:  1year ROV w/ SN>  Tina Hansen returns & denies interval resp issues- notes min cough & phlegm, no hemoptysis, chr stable DOE w/o change x yrs she says (eg- walking to mailbox, "heavy" housework, etc); her major triggers are upper resp infections & strong odors, and she denies (not aware of) reflux/ LPR/ etc (not on an antireflux regimen)... We discussed re-checking CXR/ PFT/ Labs but she declines and does not want change in  meds or addition of an anticholinergic to her regimen (another consideration would be a switch to the new once daily TRELEGY)... Note: she takes mult supplements including milk thistle, chromium, & homeopathic "voice-B10" that a friend told her about; she also does a "liver cleanse" twice yearly (grapefruits juice, epsom salts, olive oil) because "it shoots the gallstones out"    EXAM shows Afeb, VSS, O2sat=96% on RA;  Wt=266#, BMI=42;  HEENT- neg, mallampati2;  Chest- decr BS, clear w/o w/r/r;  Heart- RR w/o m/r/g;  Abd- obese w/ panniculus, soft, neg;  Ext- on MTX per DrDeveshwar for RA, VI, tr edema, no c/c... IMP/PLAN>>  Tina Hansen remains stable on Symbicort160, Albut NEBS and HFA as needed; but she has severe obstructive lung dis & is symptomatic (albeit stable)- she declines additional meds or change in her regimen and we have again discussed diet/ exercise/ wt reduction in addition to taking her meds regularly a prescribed & checking w/ her PCP regarding all indicated regular vaccinations...     Past Medical History:  Diagnosis Date  . Asthma   . Cervical cancer ([place])   . Hyperlipidemia   . Hypertension   . Rheumatoid arthritis(714.0)    [name]    Past Surgical History:  Procedure Laterality Date  . ABDOMINAL HYSTERECTOMY    . AORTIC VALVE REPLACEMENT      Outpatient Encounter Prescriptions as of 05/04/2016  Medication Sig  . albuterol (PROAIR HFA) 108 (90 Base) MCG/ACT inhaler INHALE TWO PUFFS BY MOUTH EVERY 6 HOURS AS NEEDED  . albuterol (PROVENTIL) (2.5 MG/3ML) 0.083% nebulizer solution USE ONE VIAL IN NEBULIZER EVERY 6 HOURS AS NEEDED  . Biotin 5000 MCG CAPS Take 10,000 mcg by mouth daily.  . budesonide-formoterol (SYMBICORT) 160-4.5 MCG/ACT inhaler INHALE TWO PUFFS BY  MOUTH ONCE TO TWICE DAILY  . calcium gluconate 500 MG tablet Take 500 mg by mouth daily.    . Cholecalciferol (VITAMIN D3) 1000 units CAPS Take 1 capsule by mouth daily.  . Chromium 1 MG CAPS Take 1 mg by  mouth daily. Increase to 2 caps if needed  . Coenzyme Q10 (COQ-10) 100 MG CAPS Take 1 capsule by mouth daily.    . fish oil-omega-3 fatty acids 1000 MG capsule Take 2 capsules by  mouth daily.    . folic acid (FOLVITE) 1 MG tablet Take 1 mg by mouth daily.    . IODINE, KELP, PO Put 5 drops in a liquid and drink once daily.  . Methotrexate, PF, 30 MG/0.6ML SOAJ Inject 30 mg into the skin once a week.  . Milk Thistle 200 MG CAPS Take 400 mg by mouth daily.    . Multiple Vitamin (MULTIVITAMIN) capsule Take 1 capsule by mouth daily.    . valsartan-hydrochlorothiazide (DIOVAN-HCT) 80-12.5 MG per tablet Take 1 tablet by mouth daily.    . vitamin E (VITAMIN E) 400 UNIT capsule Take 400 Units by mouth daily.  . [DISCONTINUED] budesonide-formoterol (SYMBICORT) 160-4.5 MCG/ACT inhaler INHALE TWO PUFFS BY  MOUTH ONCE TO TWICE DAILY  . [DISCONTINUED] PROAIR HFA 108 (90 Base) MCG/ACT inhaler INHALE TWO PUFFS BY MOUTH EVERY 6 HOURS AS NEEDED  . [DISCONTINUED] SYMBICORT 160-4.5 MCG/ACT inhaler INHALE TWO PUFFS BY MOUTH ONCE DAILY TO TWICE DAILY (Patient not taking: Reported on 05/04/2016)   No facility-administered encounter medications on file as of 05/04/2016.     Allergies  Allergen Reactions  . Other     Lamisil  . Theophylline     REACTION: fever    Immunization History  Administered Date(s) Administered  . Pneumococcal Polysaccharide-23 06/27/2007, 03/22/2009  SHE NEEDS TO BE SURE THAT SHE IS UP-TO-DATE ON HER VACCINES...   Family History  Problem Relation Age of Onset  . Asthma Mother   . Breast cancer Mother   Mother developed ASTHMA while touring in [place] & never got over it according to the pt...   Social History   Social History  . Marital status: Divorced    Spouse name: N/A  . Number of children: Y  . Years of education: N/A   Occupational History  . retired    Social History Main Topics  . Smoking status: Former Smoker    Packs/day: 1.00    Years: 30.00    Types:  Cigarettes    Quit date: 06/22/1985  . Smokeless tobacco: Not on file  . Alcohol use Not on file  . Drug use: Unknown  . Sexual activity: Not on file   Other Topics Concern  . Not on file   Social History Narrative  . No narrative on file    Current Medications, Allergies, Past Medical History, Past Surgical History, Family History, and Social History were reviewed in [employer] record.   Review of Systems             All symptoms NEG except where BOLDED >>  Constitutional:  F/C/S, fatigue, anorexia, unexpected weight change. HEENT:  HA, visual changes, hearing loss, earache, nasal symptoms, sore throat, mouth sores, hoarseness. Resp:  cough, sputum, hemoptysis; SOB, tightness, wheezing. Cardio:  CP, palpit, DOE, orthopnea, edema. GI:  N/V/D/C, blood in stool; reflux, abd pain, distention, gas. GU:  dysuria, freq, urgency, hematuria, flank pain, voiding difficulty. MS:  joint pain, swelling, tenderness, decr ROM; neck pain, back pain, etc. Neuro:  HA, tremors, seizures, dizziness, syncope, weakness, numbness, gait abn. Skin:  suspicious lesions or skin rash. Heme:  adenopathy, bruising, bleeding. Psyche:  confusion, agitation, sleep disturbance, hallucinations, anxiety, depression suicidal.   Objective:   Physical Exam       Vital Signs:  Reviewed...   General:  WD, WN, 75 y/o WF in NAD; alert & oriented; pleasant & cooperative... HEENT:  [place]/AT; Conjunctiva- pink, Sclera- nonicteric, EOM-wnl, PERRLA, EACs-clear, TMs-wnl; NOSE-clear; THROAT-clear & wnl.  Neck:  Supple w/ fair ROM; no JVD; normal carotid impulses w/o bruits; no thyromegaly or nodules palpated; no lymphadenopathy.  Chest:  Clear to P & A; decr BS bilat, without wheezes/ rales/ or rhonchi heard. Heart:  Regular Rhythm; norm S1 & S2 without murmurs, rubs, or gallops detected. Abdomen:  Soft & nontender, obese 2w/ panniculus- no guarding or rebound; normal bowel sounds; no organomegaly or  masses palpated. Ext:  Decr ROM; without deformities +arthritic changes; no varicose veins, +venous insuffic, tr edema;  Pulses intact w/o bruits. Neuro:  CNs II-XII intact; motor testing normal; sensory testing normal; gait normal & balance OK. Derm:  No lesions noted; no rash etc. Lymph:  No cervical, supraclavicular, axillary, or inguinal adenopathy palpated.   Assessment:      IMP>>     COPD w/ reversible component> on Symbicort160-2spBid, Albuterol by NEB & HFA inhaler as needed...    Ex-smoker, quit 1987    VCD, muscle tension dysphonia> prev eval by DrByers    Medical issues>  HBP, HL, Obesity, hx cervical cancer, RA, anxiety  PLAN>>      Tina Hansen has severe COPD and would be better served by taking her meds regularly- ie Symbicort160- 2spBid;  She has Albut both via NEB & HFA inhaler to use prn for wheezing/ SOB/ etc;  We may need to add an anticholinergic but she does not want additional meds at this time;  Ultimately her breathing would improve w/ diet/ exercise/ wt reduction! 05/04/16>   Tina Hansen remains stable on Symbicort160, Albut NEBS and HFA as needed; but she has severe obstructive lung dis & is symptomatic (albeit stable)- she declines additional meds or change in her regimen and we have again discussed diet/ exercise/ wt reduction in addition to taking her meds regularly a prescribed & checking w/ her PCP regarding all indicated regular vaccinations...      Plan:     Patient's Medications  New Prescriptions   No medications on file  Previous Medications   ALBUTEROL (PROVENTIL) (2.5 MG/3ML) 0.083% NEBULIZER SOLUTION    USE ONE VIAL IN NEBULIZER EVERY 6 HOURS AS NEEDED   BIOTIN 5000 MCG CAPS    Take 10,000 mcg by mouth daily.   CALCIUM GLUCONATE 500 MG TABLET    Take 500 mg by mouth daily.     CHOLECALCIFEROL (VITAMIN D3) 1000 UNITS CAPS    Take 1 capsule by mouth daily.   CHROMIUM 1 MG CAPS    Take 1 mg by mouth daily. Increase to 2 caps if needed   COENZYME Q10 (COQ-10)  100 MG CAPS    Take 1 capsule by mouth daily.     FISH OIL-OMEGA-3 FATTY ACIDS 1000 MG CAPSULE    Take 2 capsules by mouth daily.     FOLIC ACID (FOLVITE) 1 MG TABLET    Take 1 mg by mouth daily.     IODINE, KELP, PO    Put 5 drops in a liquid and drink once daily.   METHOTREXATE, PF, 30 MG/0.6ML SOAJ    Inject 30 mg into the skin once a week.   MILK THISTLE 200 MG CAPS    Take 400 mg by mouth daily.     MULTIPLE VITAMIN (MULTIVITAMIN) CAPSULE    Take 1 capsule by mouth daily.     VALSARTAN-HYDROCHLOROTHIAZIDE (DIOVAN-HCT) 80-12.5 MG PER TABLET    Take 1 tablet by mouth daily.     VITAMIN E (VITAMIN E) 400 UNIT CAPSULE    Take 400 Units by mouth daily.  Modified Medications   Modified Medication Previous Medication   ALBUTEROL (PROAIR HFA) 108 (90 BASE) MCG/ACT INHALER PROAIR HFA 108 (90 Base) MCG/ACT inhaler      INHALE TWO PUFFS BY MOUTH EVERY 6 HOURS AS NEEDED    INHALE TWO PUFFS BY MOUTH EVERY 6 HOURS AS NEEDED   BUDESONIDE-FORMOTEROL (SYMBICORT) 160-4.5 MCG/ACT INHALER budesonide-formoterol (SYMBICORT) 160-4.5 MCG/ACT inhaler      INHALE TWO PUFFS BY  MOUTH ONCE TO TWICE DAILY    INHALE TWO PUFFS BY  MOUTH ONCE TO TWICE DAILY  Discontinued Medications   SYMBICORT 160-4.5 MCG/ACT INHALER    INHALE TWO PUFFS BY MOUTH ONCE DAILY TO TWICE DAILY

## 2016-05-04 NOTE — Patient Instructions (Signed)
Today we updated your med list in our EPIC system...    Continue your current medications the same...    Please endeavor to take the [facility]- 2 inhalations twice daily...    Continue the NEBS & the HFA inhaler as needed...  We will proceed w/ a FULL pulmonary function test to determine the emphysematous component & see if you need additional medication to help preserve your lung function over time...    We will contact you w/ the results when available...   Stay as active as possible...  Keep up the good work w/ your diet-- low carb, low fat, and NOTHING AFTER DINNER IN THE EVE...  Call for any questions...  Let's plan a follow up visit in 66yr, sooner if needed for any breathing problems.Marland KitchenMarland Kitchen

## 2016-06-05 ENCOUNTER — Telehealth: Payer: Self-pay | Admitting: Pulmonary Disease

## 2016-06-05 NOTE — Telephone Encounter (Signed)
TW spoke with Tina Hansen--she is aware that we charge $479 for the PFT.  She is aware that she will need to contact her insurance company to see what they will cover.  She will do this and call back if she needs to cancel the PFT.

## 2016-06-26 DIAGNOSIS — M8589 Other specified disorders of bone density and structure, multiple sites: Secondary | ICD-10-CM | POA: Insufficient documentation

## 2016-06-26 DIAGNOSIS — Z8709 Personal history of other diseases of the respiratory system: Secondary | ICD-10-CM | POA: Insufficient documentation

## 2016-06-26 DIAGNOSIS — Z8679 Personal history of other diseases of the circulatory system: Secondary | ICD-10-CM | POA: Insufficient documentation

## 2016-06-26 NOTE — Progress Notes (Signed)
Office Visit Note  Patient: Tina Hansen             Date of Birth: [birth year]           MRN: [id number]             PCP: Tamsen Roers, MD Referring: Tamsen Roers, MD Visit Date: 06/29/2016 Occupation: '@GUAROCC'$ @    Subjective:  Follow-up (stiffness of hips otherwise okay) Follow-up on rheumatoid arthritis and high-risk prescription  History of Present Illness: Tina Hansen is a 76 y.o. female  Last seen 01/23/2016 Patient's RA is doing well. No joint pain swelling or stiffness.  Patient is taking methotrexate 0.3 ML's every Tuesday and doing well.  Unfortunately on this visit, she's been suffering from a upper respiratory infection since 06/15/2016. She continues to take methotrexate and I've encouraged the patient to stop temporarily until her upper respiratory symptoms resolved. Patient understands and is agreeable.  No other problems. Patient has enough medication and does not need a refill at this time.   Activities of Daily Living:  Patient reports morning stiffness for 15 minutes.   Patient Denies nocturnal pain.  Difficulty dressing/grooming: Denies Difficulty climbing stairs: Denies Difficulty getting out of chair: Denies Difficulty using hands for taps, buttons, cutlery, and/or writing: Denies   Review of Systems  Constitutional: Negative for fatigue.  HENT: Negative for mouth sores and mouth dryness.   Eyes: Negative for dryness.  Respiratory: Negative for shortness of breath.   Gastrointestinal: Negative for constipation and diarrhea.  Musculoskeletal: Negative for myalgias and myalgias.  Skin: Negative for sensitivity to sunlight.  Psychiatric/Behavioral: Negative for decreased concentration and sleep disturbance.    PMFS History:  Patient Active Problem List   Diagnosis Date Noted  . History of COPD 06/26/2016  . History of hypertension 06/26/2016  . Osteopenia of multiple sites 06/26/2016  . Morbid obesity ([place]) 05/04/2016  . High  risk medication use 04/25/2016  . Vocal cord dysfunction 04/29/2015  . Seropositive rheumatoid arthritis of multiple sites ([place]) 04/29/2015  . COPD (chronic obstructive pulmonary disease) ([place]) 10/20/2010  . HYPERLIPIDEMIA 10/06/2007  . Essential hypertension 10/06/2007  . OTHER DISEASES OF VOCAL CORDS 10/06/2007  . CERVICAL CANCER, HX OF 10/06/2007    Past Medical History:  Diagnosis Date  . Asthma   . Cervical cancer ([place])   . Hyperlipidemia   . Hypertension   . Rheumatoid arthritis(714.0)    [name]    Family History  Problem Relation Age of Onset  . Asthma Mother   . Breast cancer Mother    Past Surgical History:  Procedure Laterality Date  . ABDOMINAL HYSTERECTOMY    . AORTIC VALVE REPLACEMENT     Social History   Social History Narrative  . No narrative on file     Objective: Vital Signs: BP (!) 148/80   Pulse 94   Resp 18   Ht '5\' 6"'$  (1.676 m)   Wt 264 lb (119.7 kg)   BMI 42.61 kg/m    Physical Exam  Constitutional: She is oriented to person, place, and time. She appears well-developed and well-nourished.  HENT:  Head: Normocephalic and atraumatic.  Eyes: EOM are normal. Pupils are equal, round, and reactive to light.  Cardiovascular: Normal rate, regular rhythm and normal heart sounds.  Exam reveals no gallop and no friction rub.   No murmur heard. Pulmonary/Chest: Effort normal and breath sounds normal. She has no wheezes. She has no rales.  Abdominal: Soft. Bowel sounds are normal. She exhibits no distension.  There is no tenderness. There is no guarding. No hernia.  Musculoskeletal: Normal range of motion. She exhibits no edema, tenderness or deformity.  Lymphadenopathy:    She has no cervical adenopathy.  Neurological: She is alert and oriented to person, place, and time. Coordination normal.  Skin: Skin is warm and dry. Capillary refill takes less than 2 seconds. No rash noted.  Psychiatric: She has a normal mood and affect. Her behavior is  normal.  Nursing note and vitals reviewed.    Musculoskeletal Exam:  Full range of motion of all joints Grip strength is equal and strong bilaterally Fiber myalgia tender points are all absent  CDAI Exam: CDAI Homunculus Exam:   Joint Counts:  CDAI Tender Joint count: 0 CDAI Swollen Joint count: 0  Global Assessments:  Patient Global Assessment: 0 Provider Global Assessment: 0  No synovitis on exam  Investigation: Findings:   Labs from 04/02/09 revealing CMP with GFR normal except glucose 109.  CBC with diff normal.  SPEP negative.  Sed rate elevated 37. Hep panel negative.  RF elevated 451.  ANA negative.  Uric acid normal.  CK normal.  Ace level negative.  CCP elevated 887.  HLA-B27 negative.  PPD negative.    Labs from December 18, 2015, show CMP with GFR normal.  CBC with diff normal.  RAPID3 shows a raw score of 3.0 with an index of 1.0, which is consistent with near remission.   12/15/2012  X-rays of bilateral hands some PIP and DIP joint space narrowing.  No MCP or intercarpal joint changes, no erosions.  No interval changes compared to October 2012 x-rays.   X-rays of bilateral feet, PIP and DIP joint space narrowing, no MTP joint space narrowing, no erosive changes noted.  No changes from October 2012 for feet.     Imaging: No results found.  Speciality Comments: No specialty comments available.    Procedures:  No procedures performed Allergies: Other and Theophylline   Assessment / Plan:     Visit Diagnoses: Seropositive rheumatoid arthritis of multiple sites ([place]) - +RF + CCP  High risk medication use - Plan: Comprehensive metabolic panel, CBC with Differential/Platelet, Comprehensive metabolic panel, CBC with Differential/Platelet  History of COPD  Morbid obesity ([place])  History of hypertension  Osteopenia of multiple sites  Hypercholesterolemia - Plan: Lipid panel   Plan: #1: Patient is taking methotrexate 0.3 ML's every Tuesday and folic acid 1  mg every day. Adequate response.  #2: Having upper respiratory infection now since 06/15/2016. I've advised the patient to stop the methotrexate temporarily until this resolves and she is agreeable.  #3: No med refills needed at this time.  #4: CBC with differential and CMP with GFR today and then every 3 months  #5: Return to clinic in 5-6 months. Note patient request to return back in 6 months.  #7: No synovitis on examination patient is stable with her rheumatoid arthritis Orders: Orders Placed This Encounter  Procedures  . Comprehensive metabolic panel  . CBC with Differential/Platelet  . Lipid panel   No orders of the defined types were placed in this encounter.   Face-to-face time spent with patient was 30 minutes. 50% of time was spent in counseling and coordination of care.  Follow-Up Instructions: Return in about 6 months (around 12/27/2016) for RA,Mtx 1.9JY, FOLIC '1mg'$ , URI x dec 25 & ongoing; .   Eliezer Lofts, PA-C  Note - This record has been created using [employer].  Chart creation errors have been sought,  but may not always  have been located. Such creation errors do not reflect on  the standard of medical care.

## 2016-06-29 ENCOUNTER — Ambulatory Visit (INDEPENDENT_AMBULATORY_CARE_PROVIDER_SITE_OTHER): Payer: Medicare HMO | Admitting: Rheumatology

## 2016-06-29 ENCOUNTER — Encounter: Payer: Self-pay | Admitting: Rheumatology

## 2016-06-29 VITALS — BP 148/80 | HR 94 | Resp 18 | Ht 66.0 in | Wt 264.0 lb

## 2016-06-29 DIAGNOSIS — E7800 Pure hypercholesterolemia, unspecified: Secondary | ICD-10-CM | POA: Diagnosis not present

## 2016-06-29 DIAGNOSIS — Z79899 Other long term (current) drug therapy: Secondary | ICD-10-CM

## 2016-06-29 DIAGNOSIS — M0579 Rheumatoid arthritis with rheumatoid factor of multiple sites without organ or systems involvement: Secondary | ICD-10-CM

## 2016-06-29 DIAGNOSIS — Z8709 Personal history of other diseases of the respiratory system: Secondary | ICD-10-CM

## 2016-06-29 DIAGNOSIS — Z8679 Personal history of other diseases of the circulatory system: Secondary | ICD-10-CM | POA: Diagnosis not present

## 2016-06-29 DIAGNOSIS — M8589 Other specified disorders of bone density and structure, multiple sites: Secondary | ICD-10-CM | POA: Diagnosis not present

## 2016-06-29 LAB — CBC WITH DIFFERENTIAL/PLATELET
BASOS ABS: 0 {cells}/uL (ref 0–200)
Basophils Relative: 0 %
EOS ABS: 288 {cells}/uL (ref 15–500)
Eosinophils Relative: 4 %
HEMATOCRIT: 42.9 % (ref 35.0–45.0)
Hemoglobin: 14.1 g/dL (ref 11.7–15.5)
LYMPHS PCT: 17 %
Lymphs Abs: 1224 cells/uL (ref 850–3900)
MCH: 29.6 pg (ref 27.0–33.0)
MCHC: 32.9 g/dL (ref 32.0–36.0)
MCV: 90.1 fL (ref 80.0–100.0)
MONO ABS: 648 {cells}/uL (ref 200–950)
MONOS PCT: 9 %
MPV: 10.2 fL (ref 7.5–12.5)
NEUTROS ABS: 5040 {cells}/uL (ref 1500–7800)
Neutrophils Relative %: 70 %
PLATELETS: 246 10*3/uL (ref 140–400)
RBC: 4.76 MIL/uL (ref 3.80–5.10)
RDW: 14.8 % (ref 11.0–15.0)
WBC: 7.2 10*3/uL (ref 3.8–10.8)

## 2016-06-30 LAB — COMPREHENSIVE METABOLIC PANEL
ALT: 16 U/L (ref 6–29)
AST: 24 U/L (ref 10–35)
Albumin: 3.9 g/dL (ref 3.6–5.1)
Alkaline Phosphatase: 56 U/L (ref 33–130)
BILIRUBIN TOTAL: 0.5 mg/dL (ref 0.2–1.2)
BUN: 14 mg/dL (ref 7–25)
CHLORIDE: 105 mmol/L (ref 98–110)
CO2: 27 mmol/L (ref 20–31)
CREATININE: 0.83 mg/dL (ref 0.60–0.93)
Calcium: 9.2 mg/dL (ref 8.6–10.4)
GLUCOSE: 114 mg/dL — AB (ref 65–99)
Potassium: 5.2 mmol/L (ref 3.5–5.3)
Sodium: 140 mmol/L (ref 135–146)
Total Protein: 6.8 g/dL (ref 6.1–8.1)

## 2016-06-30 LAB — LIPID PANEL
Cholesterol: 218 mg/dL — ABNORMAL HIGH (ref <200)
HDL: 76 mg/dL (ref >50)
LDL CALC: 116 mg/dL — AB (ref <100)
Total CHOL/HDL Ratio: 2.9 Ratio (ref <5.0)
Triglycerides: 128 mg/dL (ref <150)
VLDL: 26 mg/dL (ref <30)

## 2016-11-09 ENCOUNTER — Other Ambulatory Visit: Payer: Self-pay | Admitting: Rheumatology

## 2016-11-09 ENCOUNTER — Telehealth: Payer: Self-pay | Admitting: Rheumatology

## 2016-11-09 ENCOUNTER — Other Ambulatory Visit: Payer: Self-pay

## 2016-11-09 DIAGNOSIS — Z79899 Other long term (current) drug therapy: Secondary | ICD-10-CM | POA: Diagnosis not present

## 2016-11-09 LAB — CBC WITH DIFFERENTIAL/PLATELET
BASOS ABS: 78 {cells}/uL (ref 0–200)
Basophils Relative: 1 %
EOS ABS: 156 {cells}/uL (ref 15–500)
EOS PCT: 2 %
HCT: 43.9 % (ref 35.0–45.0)
Hemoglobin: 14.1 g/dL (ref 11.7–15.5)
LYMPHS ABS: 1326 {cells}/uL (ref 850–3900)
Lymphocytes Relative: 17 %
MCH: 28.1 pg (ref 27.0–33.0)
MCHC: 32.1 g/dL (ref 32.0–36.0)
MCV: 87.5 fL (ref 80.0–100.0)
MONOS PCT: 9 %
MPV: 9.8 fL (ref 7.5–12.5)
Monocytes Absolute: 702 cells/uL (ref 200–950)
Neutro Abs: 5538 cells/uL (ref 1500–7800)
Neutrophils Relative %: 71 %
Platelets: 256 10*3/uL (ref 140–400)
RBC: 5.02 MIL/uL (ref 3.80–5.10)
RDW: 15.7 % — AB (ref 11.0–15.0)
WBC: 7.8 10*3/uL (ref 3.8–10.8)

## 2016-11-09 NOTE — Telephone Encounter (Signed)
Patient came in today for labs 

## 2016-11-09 NOTE — Telephone Encounter (Signed)
She came in today for labs

## 2016-11-09 NOTE — Telephone Encounter (Signed)
Patient request refill on her MTX, and Folic Acid. Patient uses [facility] on [address].

## 2016-11-10 LAB — COMPREHENSIVE METABOLIC PANEL
ALBUMIN: 4.2 g/dL (ref 3.6–5.1)
ALT: 20 U/L (ref 6–29)
AST: 20 U/L (ref 10–35)
Alkaline Phosphatase: 64 U/L (ref 33–130)
BUN: 17 mg/dL (ref 7–25)
CO2: 24 mmol/L (ref 20–31)
Calcium: 9.5 mg/dL (ref 8.6–10.4)
Chloride: 104 mmol/L (ref 98–110)
Creat: 0.98 mg/dL — ABNORMAL HIGH (ref 0.60–0.93)
GLUCOSE: 104 mg/dL — AB (ref 65–99)
POTASSIUM: 5 mmol/L (ref 3.5–5.3)
Sodium: 140 mmol/L (ref 135–146)
Total Bilirubin: 0.6 mg/dL (ref 0.2–1.2)
Total Protein: 6.9 g/dL (ref 6.1–8.1)

## 2016-11-10 NOTE — Telephone Encounter (Signed)
ok 

## 2016-11-10 NOTE — Progress Notes (Signed)
stable °

## 2016-11-10 NOTE — Telephone Encounter (Signed)
Last Visit: 06/29/16 Next Visit: 12/31/16 Labs: 11/09/16 Creat 0.98 previously 0.83  Okay to refill MTX and Folic Acid?

## 2016-12-31 ENCOUNTER — Encounter: Payer: Self-pay | Admitting: Rheumatology

## 2016-12-31 ENCOUNTER — Ambulatory Visit (INDEPENDENT_AMBULATORY_CARE_PROVIDER_SITE_OTHER): Payer: Medicare HMO | Admitting: Rheumatology

## 2016-12-31 VITALS — BP 158/67 | HR 81 | Resp 18 | Ht 67.0 in | Wt 266.0 lb

## 2016-12-31 DIAGNOSIS — I10 Essential (primary) hypertension: Secondary | ICD-10-CM | POA: Diagnosis not present

## 2016-12-31 DIAGNOSIS — E7800 Pure hypercholesterolemia, unspecified: Secondary | ICD-10-CM | POA: Diagnosis not present

## 2016-12-31 DIAGNOSIS — E559 Vitamin D deficiency, unspecified: Secondary | ICD-10-CM | POA: Diagnosis not present

## 2016-12-31 DIAGNOSIS — M0579 Rheumatoid arthritis with rheumatoid factor of multiple sites without organ or systems involvement: Secondary | ICD-10-CM | POA: Diagnosis not present

## 2016-12-31 DIAGNOSIS — Z79899 Other long term (current) drug therapy: Secondary | ICD-10-CM | POA: Diagnosis not present

## 2016-12-31 DIAGNOSIS — Z780 Asymptomatic menopausal state: Secondary | ICD-10-CM | POA: Diagnosis not present

## 2016-12-31 LAB — COMPLETE METABOLIC PANEL WITH GFR
ALT: 27 U/L (ref 6–29)
AST: 27 U/L (ref 10–35)
Albumin: 4 g/dL (ref 3.6–5.1)
Alkaline Phosphatase: 63 U/L (ref 33–130)
BUN: 20 mg/dL (ref 7–25)
CHLORIDE: 102 mmol/L (ref 98–110)
CO2: 24 mmol/L (ref 20–31)
Calcium: 9.4 mg/dL (ref 8.6–10.4)
Creat: 1 mg/dL — ABNORMAL HIGH (ref 0.60–0.93)
GFR, Est African American: 63 mL/min (ref >=60)
GFR, Est Non African American: 55 mL/min — ABNORMAL LOW (ref >=60)
GLUCOSE: 113 mg/dL — AB (ref 65–99)
POTASSIUM: 5.3 mmol/L (ref 3.5–5.3)
Sodium: 139 mmol/L (ref 135–146)
Total Bilirubin: 0.5 mg/dL (ref 0.2–1.2)
Total Protein: 6.8 g/dL (ref 6.1–8.1)

## 2016-12-31 LAB — CBC WITH DIFFERENTIAL/PLATELET
BASOS PCT: 1 %
Basophils Absolute: 60 cells/uL (ref 0–200)
EOS PCT: 2 %
Eosinophils Absolute: 120 cells/uL (ref 15–500)
HCT: 44.1 % (ref 35.0–45.0)
Hemoglobin: 14.4 g/dL (ref 11.7–15.5)
LYMPHS ABS: 1260 {cells}/uL (ref 850–3900)
LYMPHS PCT: 21 %
MCH: 29.8 pg (ref 27.0–33.0)
MCHC: 32.7 g/dL (ref 32.0–36.0)
MCV: 91.3 fL (ref 80.0–100.0)
MPV: 9.7 fL (ref 7.5–12.5)
Monocytes Absolute: 600 cells/uL (ref 200–950)
Monocytes Relative: 10 %
NEUTROS PCT: 66 %
Neutro Abs: 3960 cells/uL (ref 1500–7800)
Platelets: 255 10*3/uL (ref 140–400)
RBC: 4.83 MIL/uL (ref 3.80–5.10)
RDW: 15.3 % — AB (ref 11.0–15.0)
WBC: 6 10*3/uL (ref 3.8–10.8)

## 2016-12-31 LAB — LIPID PANEL
CHOL/HDL RATIO: 2.8 ratio (ref <5.0)
CHOLESTEROL: 236 mg/dL — AB (ref <200)
HDL: 84 mg/dL (ref >50)
LDL Cholesterol: 133 mg/dL — ABNORMAL HIGH (ref <100)
Triglycerides: 94 mg/dL (ref <150)
VLDL: 19 mg/dL (ref <30)

## 2016-12-31 MED ORDER — "TUBERCULIN-ALLERGY SYRINGES 27G X 1/2"" 1 ML KIT": 1.0000 | PACK | 4 refills | Status: AC

## 2016-12-31 NOTE — Progress Notes (Signed)
Office Visit Note  Patient: Tina Hansen             Date of Birth: Mar 26, 1941           MRN: [id number]             PCP: Tamsen Roers, MD Referring: Tamsen Roers, MD Visit Date: 12/31/2016 Occupation: '@GUAROCC' @    Subjective:  Rheumatoid Arthritis (Follow up, doing good)   History of Present Illness: Tina Hansen is a 76 y.o. female who was last seen in our office 06/29/2016 for rheumatoid arthritis and high risk prescription (methotrexate 0.3 ML's per week and folic acid 1 mg per day).   Today, pt is doing well today . Patient reports that after the last visit, her upper respiratory infection resolved in about a week after that visit. However, since she was doing fairly well with her joints, she decided to stop taking her methotrexate. She did not take it for the rest of January or February. Then she reports that she cut her dogs hair in late March 2018. Her dog is quite large and has a lot of hair and it takes her quite a long time to do the cutting of the hair. Patient reports that her hands became stiff and swollen after that haircut. She restarted her methotrexate because of this flare. She has been taking her methotrexate ever since then.  Patient also states that she used 0.4 mls of methotrexate because of the increased stiffness and flare that she had. Her normal dose is 0.3 ML's. She only took 0.4 ML's for the first dose and then went back to her usual 0.3 ML's every week and she's been doing that since then.  Today, patient is doing fine with her rheumatoid arthritis. No complaint. No joint pain, swelling, stiffness.  Activities of Daily Living:  Patient reports morning stiffness for less than 5  minutes.   Patient Denies nocturnal pain.  Difficulty dressing/grooming: Denies Difficulty climbing stairs: Denies Difficulty getting out of chair: Denies Difficulty using hands for taps, buttons, cutlery, and/or writing: Denies   Review of Systems    Constitutional: Negative for fatigue.  HENT: Negative for mouth sores and mouth dryness.   Eyes: Negative for dryness.  Respiratory: Negative for shortness of breath.   Gastrointestinal: Negative for constipation and diarrhea.  Musculoskeletal: Negative for myalgias and myalgias.  Skin: Negative for sensitivity to sunlight.  Psychiatric/Behavioral: Negative for decreased concentration and sleep disturbance.    PMFS History:  Patient Active Problem List   Diagnosis Date Noted  . History of COPD 06/26/2016  . History of hypertension 06/26/2016  . Osteopenia of multiple sites 06/26/2016  . Morbid obesity ([place]) 05/04/2016  . High risk medication use 04/25/2016  . Vocal cord dysfunction 04/29/2015  . Seropositive rheumatoid arthritis of multiple sites ([place]) 04/29/2015  . COPD (chronic obstructive pulmonary disease) ([place]) 10/20/2010  . HYPERLIPIDEMIA 10/06/2007  . Essential hypertension 10/06/2007  . OTHER DISEASES OF VOCAL CORDS 10/06/2007  . CERVICAL CANCER, HX OF 10/06/2007    Past Medical History:  Diagnosis Date  . Asthma   . Cervical cancer ([place])   . Hyperlipidemia   . Hypertension   . Rheumatoid arthritis(714.0)    Tina Hansen    Family History  Problem Relation Age of Onset  . Asthma Mother   . Breast cancer Mother    Past Surgical History:  Procedure Laterality Date  . ABDOMINAL HYSTERECTOMY    . TUBAL LIGATION    . TUMOR REMOVAL Right  FOREARM   Social History   Social History Narrative  . No narrative on file     Objective: Vital Signs: BP (!) 158/67 (BP Location: Left Arm, Patient Position: Sitting, Cuff Size: Normal)   Pulse 81   Resp 18   Ht '5\' 7"'  (1.702 m)   Wt 266 lb (120.7 kg)   BMI 41.66 kg/m    Physical Exam  Constitutional: She is oriented to person, place, and time. She appears well-developed and well-nourished.  HENT:  Head: Normocephalic and atraumatic.  Eyes: Pupils are equal, round, and reactive to light. EOM are normal.   Cardiovascular: Normal rate, regular rhythm and normal heart sounds.  Exam reveals no gallop and no friction rub.   No murmur heard. Pulmonary/Chest: Effort normal and breath sounds normal. She has no wheezes. She has no rales.  Abdominal: Soft. Bowel sounds are normal. She exhibits no distension. There is no tenderness. There is no guarding. No hernia.  Musculoskeletal: Normal range of motion. She exhibits no edema, tenderness or deformity.  Lymphadenopathy:    She has no cervical adenopathy.  Neurological: She is alert and oriented to person, place, and time. Coordination normal.  Skin: Skin is warm and dry. Capillary refill takes less than 2 seconds. No rash noted.  Psychiatric: She has a normal mood and affect. Her behavior is normal.  Nursing note and vitals reviewed.    Musculoskeletal Exam:  Full range of motion of all joints Grip strength is equal and strong bilaterally Fiber myalgia tender points are all absent  CDAI Exam: CDAI Homunculus Exam:   Joint Counts:  CDAI Tender Joint count: 0 CDAI Swollen Joint count: 0  Global Assessments:  Patient Global Assessment: 1 Provider Global Assessment: 1  CDAI Calculated Score: 2  Synovitis on examination  Investigation: No additional findings. Orders Only on 11/09/2016  Component Date Value Ref Range Status  . Sodium 11/09/2016 140  135 - 146 mmol/L Final  . Potassium 11/09/2016 5.0  3.5 - 5.3 mmol/L Final  . Chloride 11/09/2016 104  98 - 110 mmol/L Final  . CO2 11/09/2016 24  20 - 31 mmol/L Final  . Glucose, Bld 11/09/2016 104* 65 - 99 mg/dL Final  . BUN 11/09/2016 17  7 - 25 mg/dL Final  . Creat 11/09/2016 0.98* 0.60 - 0.93 mg/dL Final   Comment:   For patients > or = 76 years of age: The upper reference limit for Creatinine is approximately 13% higher for people identified as African-American.     . Total Bilirubin 11/09/2016 0.6  0.2 - 1.2 mg/dL Final  . Alkaline Phosphatase 11/09/2016 64  33 - 130 U/L Final   . AST 11/09/2016 20  10 - 35 U/L Final  . ALT 11/09/2016 20  6 - 29 U/L Final  . Total Protein 11/09/2016 6.9  6.1 - 8.1 g/dL Final  . Albumin 11/09/2016 4.2  3.6 - 5.1 g/dL Final  . Calcium 11/09/2016 9.5  8.6 - 10.4 mg/dL Final  . WBC 11/09/2016 7.8  3.8 - 10.8 K/uL Final  . RBC 11/09/2016 5.02  3.80 - 5.10 MIL/uL Final  . Hemoglobin 11/09/2016 14.1  11.7 - 15.5 g/dL Final  . HCT 11/09/2016 43.9  35.0 - 45.0 % Final  . MCV 11/09/2016 87.5  80.0 - 100.0 fL Final  . MCH 11/09/2016 28.1  27.0 - 33.0 pg Final  . MCHC 11/09/2016 32.1  32.0 - 36.0 g/dL Final  . RDW 11/09/2016 15.7* 11.0 - 15.0 % Final  .  Platelets 11/09/2016 256  140 - 400 K/uL Final  . MPV 11/09/2016 9.8  7.5 - 12.5 fL Final  . Neutro Abs 11/09/2016 5538  1,500 - 7,800 cells/uL Final  . Lymphs Abs 11/09/2016 1326  850 - 3,900 cells/uL Final  . Monocytes Absolute 11/09/2016 702  200 - 950 cells/uL Final  . Eosinophils Absolute 11/09/2016 156  15 - 500 cells/uL Final  . Basophils Absolute 11/09/2016 78  0 - 200 cells/uL Final  . Neutrophils Relative % 11/09/2016 71  % Final  . Lymphocytes Relative 11/09/2016 17  % Final  . Monocytes Relative 11/09/2016 9  % Final  . Eosinophils Relative 11/09/2016 2  % Final  . Basophils Relative 11/09/2016 1  % Final  . Smear Review 11/09/2016 Criteria for review not met   Final     Imaging: No results found.  Speciality Comments: No specialty comments available.    Procedures:  No procedures performed Allergies: Other and Theophylline   Assessment / Plan:     Visit Diagnoses: Seropositive rheumatoid arthritis of multiple sites ([place])  High risk medication use - Plan: CBC with Differential/Platelet, COMPLETE METABOLIC PANEL WITH GFR  Hypercholesterolemia - Plan: Lipid panel  Vitamin D deficiency - Plan: VITAMIN D 25 Hydroxy (Vit-D Deficiency, Fractures)  Post-menopausal - Plan: COMPLETE METABOLIC PANEL WITH GFR, VITAMIN D 25 Hydroxy (Vit-D Deficiency,  Fractures)  Hypertension, unspecified type   Plan: #1: Rheumatoid arthritis. No joint pain, swelling, stiffness. Doing well.  #2: Methotrexate 0.3 ML's per week. Patient held off on taking the methotrexate ever since the last visit. She was supposed to suspend using methotrexate cut she had an upper respiratory tract infection at that office visit. But since her joints were doing very well she decided to hold off on the medication and see if she would flare off of the medication. She reports that she flared at the end of March 2018 after giving her large dog a haircut that took several hours to do. Once that happened, she restarted her methotrexate 0.4 ML's per week. After the first week at 0.4, she went back to 0.3 mmol per week. She has been doing 0.3 miles per week since and of March 2018 and is doing fine and getting adequate relief.  She reports that she is using diabetes syringes to give her some methotrexate. She reports that she is using "30 units". I advised her that the diabetes syringes may not be interchangeable with tuberculin syringes and therefore I have rewritten a prescription to confirm that she gets the right syringe to get the medication properly.  Patient understands and is agreeable.  #3: Patient's blood pressure is mildly elevated today. Patient states that she hasn't seen her Pap family doctor for the last 2 years because she is able to manage her blood pressure and cholesterol on her own. She would like for [place] to draw the cholesterol levels since were drawing blood for her today. I strongly encouraged the patient to follow up with her PCP for proper management of her blood pressure.  #4: History of vitamin D deficiency. She is taking supplements at this time but she is unsure what the current levels are in her serum. I'll draw vitamin D levels.  #5 she'll coordinate care with her PCP regarding her bone density.  #6: Return to clinic in 5  months   Orders: Orders Placed This Encounter  Procedures  . CBC with Differential/Platelet  . COMPLETE METABOLIC PANEL WITH GFR  . VITAMIN D 25 Hydroxy (  Vit-D Deficiency, Fractures)  . Lipid panel   Meds ordered this encounter  Medications  . Tuberculin-Allergy Syringes 27G X 1/2" 1 ML KIT    Sig: Inject 1 Syringe into the skin once a week. If syringe size greater than 53m, please call me at my office.    Dispense:  12 each    Refill:  4    Order Specific Question:   Supervising Provider    Answer:   DBo Merino[[phone]   Face-to-face time spent with patient was 30 minutes. Greater than 50% of time was spent in counseling and coordination of care.  Follow-Up Instructions: Return in about 5 months (around 06/02/2017) for RA,mtx 0.339mfolic 80m38md, vit d def, htn, incr cholesterol.   NaiEliezer LoftsA-C  Note - This record has been created using [employer] creation errors have been sought, but may not always  have been located. Such creation errors do not reflect on  the standard of medical care.

## 2017-01-01 LAB — VITAMIN D 25 HYDROXY (VIT D DEFICIENCY, FRACTURES): Vit D, 25-Hydroxy: 56 ng/mL (ref 30–100)

## 2017-01-01 NOTE — Progress Notes (Signed)
Labs are stable. LDL elevated. Please fax results to her PCP

## 2017-01-28 ENCOUNTER — Other Ambulatory Visit: Payer: Self-pay | Admitting: Pulmonary Disease

## 2017-01-28 MED ORDER — ALBUTEROL SULFATE HFA 108 (90 BASE) MCG/ACT IN AERS: 1.0000 | INHALATION_SPRAY | Freq: Four times a day (QID) | RESPIRATORY_TRACT | 6 refills | Status: DC | PRN

## 2017-03-24 ENCOUNTER — Other Ambulatory Visit: Payer: Self-pay | Admitting: Rheumatology

## 2017-03-25 NOTE — Telephone Encounter (Signed)
Last Visit: 12/31/16 Next Visit: 06/03/17 Labs: 12/31/16 Stable   Okay to refill per Dr. [name]

## 2017-05-11 ENCOUNTER — Other Ambulatory Visit: Payer: Self-pay | Admitting: *Deleted

## 2017-05-11 DIAGNOSIS — Z79899 Other long term (current) drug therapy: Secondary | ICD-10-CM

## 2017-05-11 LAB — CBC WITH DIFFERENTIAL/PLATELET
BASOS ABS: 52 {cells}/uL (ref 0–200)
BASOS PCT: 0.9 %
EOS ABS: 180 {cells}/uL (ref 15–500)
Eosinophils Relative: 3.1 %
HEMATOCRIT: 41.4 % (ref 35.0–45.0)
HEMOGLOBIN: 14.1 g/dL (ref 11.7–15.5)
LYMPHS ABS: 1479 {cells}/uL (ref 850–3900)
MCH: 29.8 pg (ref 27.0–33.0)
MCHC: 34.1 g/dL (ref 32.0–36.0)
MCV: 87.5 fL (ref 80.0–100.0)
MPV: 10.5 fL (ref 7.5–12.5)
Monocytes Relative: 9.5 %
NEUTROS ABS: 3538 {cells}/uL (ref 1500–7800)
Neutrophils Relative %: 61 %
Platelets: 260 10*3/uL (ref 140–400)
RBC: 4.73 10*6/uL (ref 3.80–5.10)
RDW: 14.3 % (ref 11.0–15.0)
Total Lymphocyte: 25.5 %
WBC: 5.8 10*3/uL (ref 3.8–10.8)
WBCMIX: 551 {cells}/uL (ref 200–950)

## 2017-05-11 LAB — COMPLETE METABOLIC PANEL WITH GFR
AG RATIO: 1.6 (calc) (ref 1.0–2.5)
ALT: 19 U/L (ref 6–29)
AST: 19 U/L (ref 10–35)
Albumin: 4.2 g/dL (ref 3.6–5.1)
Alkaline phosphatase (APISO): 54 U/L (ref 33–130)
BUN: 20 mg/dL (ref 7–25)
CALCIUM: 9.4 mg/dL (ref 8.6–10.4)
CO2: 30 mmol/L (ref 20–32)
CREATININE: 0.84 mg/dL (ref 0.60–0.93)
Chloride: 108 mmol/L (ref 98–110)
GFR, EST AFRICAN AMERICAN: 78 mL/min/{1.73_m2} (ref >=60)
GFR, EST NON AFRICAN AMERICAN: 68 mL/min/{1.73_m2} (ref >=60)
GLUCOSE: 100 mg/dL — AB (ref 65–99)
Globulin: 2.7 g/dL (calc) (ref 1.9–3.7)
Potassium: 5.3 mmol/L (ref 3.5–5.3)
Sodium: 144 mmol/L (ref 135–146)
TOTAL PROTEIN: 6.9 g/dL (ref 6.1–8.1)
Total Bilirubin: 0.3 mg/dL (ref 0.2–1.2)

## 2017-05-23 ENCOUNTER — Other Ambulatory Visit: Payer: Self-pay | Admitting: Pulmonary Disease

## 2017-05-23 NOTE — Progress Notes (Signed)
Office Visit Note  Patient: Tina Hansen             Date of Birth: Sep 25, 1940           MRN: [id number]             PCP: Tamsen Roers, MD Referring: Tamsen Roers, MD Visit Date: 06/03/2017 Occupation: @GUAROCC @    Subjective:  Stiffness in hips.   History of Present Illness: Tina Hansen is a 76 y.o. female with history of rheumatoid arthritis. She states on methotrexate her symptoms are fairly well controlled. She had some stiffness in her hands and hip joints. She denies any joint swelling. She's been tolerating methotrexate well. She states she has a lump on her left shoulder which has increased in size of the last 6 months. It is not painful. She states that she would like to have it resected it is uncomfortable for her and it bothers her when she wears her bra strap.  Activities of Daily Living:  Patient reports morning stiffness for 0 minute.   Patient Denies nocturnal pain.  Difficulty dressing/grooming: Denies Difficulty climbing stairs: Reports due to COPD Difficulty getting out of chair: Denies Difficulty using hands for taps, buttons, cutlery, and/or writing: Denies   Review of Systems  Constitutional: Negative for fatigue, night sweats, weight gain, weight loss and weakness.  HENT: Positive for mouth dryness. Negative for mouth sores, trouble swallowing, trouble swallowing and nose dryness.   Eyes: Negative for pain, redness, visual disturbance and dryness.  Respiratory: Positive for shortness of breath. Negative for cough and difficulty breathing.   Cardiovascular: Positive for hypertension. Negative for chest pain, palpitations, irregular heartbeat and swelling in legs/feet.  Gastrointestinal: Negative for blood in stool, constipation and diarrhea.  Endocrine: Negative for increased urination.  Genitourinary: Negative for vaginal dryness.  Musculoskeletal: Positive for arthralgias and joint pain. Negative for joint swelling, myalgias, muscle weakness,  muscle tenderness and myalgias.  Skin: Negative for color change, rash, hair loss, skin tightness, ulcers and sensitivity to sunlight.  Allergic/Immunologic: Negative for susceptible to infections.  Neurological: Negative for dizziness, memory loss and night sweats.  Hematological: Negative for swollen glands.  Psychiatric/Behavioral: Positive for sleep disturbance. Negative for depressed mood. The patient is not nervous/anxious.     PMFS History:  Patient Active Problem List   Diagnosis Date Noted  . History of COPD 06/26/2016  . History of hypertension 06/26/2016  . Osteopenia of multiple sites 06/26/2016  . Morbid obesity ([place]) 05/04/2016  . High risk medication use 04/25/2016  . Vocal cord dysfunction 04/29/2015  . Seropositive rheumatoid arthritis of multiple sites ([place]) 04/29/2015  . COPD (chronic obstructive pulmonary disease) ([place]) 10/20/2010  . HYPERLIPIDEMIA 10/06/2007  . Essential hypertension 10/06/2007  . OTHER DISEASES OF VOCAL CORDS 10/06/2007  . CERVICAL CANCER, HX OF 10/06/2007    Past Medical History:  Diagnosis Date  . Asthma   . Cervical cancer ([place])   . Hyperlipidemia   . Hypertension   . Rheumatoid arthritis(714.0)    Tina Hansen    Family History  Problem Relation Age of Onset  . Asthma Mother   . Breast cancer Mother    Past Surgical History:  Procedure Laterality Date  . ABDOMINAL HYSTERECTOMY    . TUBAL LIGATION    . TUMOR REMOVAL Right    FOREARM   Social History   Social History Narrative  . Not on file     Objective: Vital Signs: BP (!) 186/85 (BP Location: Left Arm, Patient Position:  Sitting, Cuff Size: Normal)   Pulse 90   Resp 18   Ht 5\' 7"  (1.702 m)   Wt 259 lb (117.5 kg)   BMI 40.57 kg/m    Physical Exam  Constitutional: She is oriented to person, place, and time. She appears well-developed and well-nourished.  HENT:  Head: Normocephalic and atraumatic.  Eyes: Conjunctivae and EOM are normal.  Neck: Normal range of  motion.  Cardiovascular: Normal rate, regular rhythm, normal heart sounds and intact distal pulses.  Pulmonary/Chest: Effort normal and breath sounds normal.  Abdominal: Soft. Bowel sounds are normal.  Lymphadenopathy:    She has no cervical adenopathy.  Neurological: She is alert and oriented to person, place, and time.  Skin: Skin is warm and dry. Capillary refill takes less than 2 seconds.  An approximately 3 x 3 soft tissues subcutaneous mass was palpable over left suprascapular region.  Psychiatric: She has a normal mood and affect. Her behavior is normal.  Nursing note and vitals reviewed.    Musculoskeletal Exam: C-spine and thoracic lumbar spine good range of motion. Shoulder joints elbow joints wrist joint MCPs PIPs DIPs are good range of motion. She is some thickening of PIP/DIP joints in her hands. Hip joints, knee joints, ankle joints, MTPs PIPs DIPs are good range of motion with no synovitis.  CDAI Exam: CDAI Homunculus Exam:   Joint Counts:  CDAI Tender Joint count: 0 CDAI Swollen Joint count: 0  Global Assessments:  Patient Global Assessment: 1 Provider Global Assessment: 1  CDAI Calculated Score: 2    Investigation: No additional findings. CBC Latest Ref Rng & Units 05/11/2017 12/31/2016 11/09/2016  WBC 3.8 - 10.8 Thousand/uL 5.8 6.0 7.8  Hemoglobin 11.7 - 15.5 g/dL 14.1 14.4 14.1  Hematocrit 35.0 - 45.0 % 41.4 44.1 43.9  Platelets 140 - 400 Thousand/uL 260 255 256   CMP Latest Ref Rng & Units 05/11/2017 12/31/2016 11/09/2016  Glucose 65 - 99 mg/dL 100(H) 113(H) 104(H)  BUN 7 - 25 mg/dL 20 20 17   Creatinine 0.60 - 0.93 mg/dL 0.84 1.00(H) 0.98(H)  Sodium 135 - 146 mmol/L 144 139 140  Potassium 3.5 - 5.3 mmol/L 5.3 5.3 5.0  Chloride 98 - 110 mmol/L 108 102 104  CO2 20 - 32 mmol/L 30 24 24   Calcium 8.6 - 10.4 mg/dL 9.4 9.4 9.5  Total Protein 6.1 - 8.1 g/dL 6.9 6.8 6.9  Total Bilirubin 0.2 - 1.2 mg/dL 0.3 0.5 0.6  Alkaline Phos 33 - 130 U/L - 63 64  AST 10 -  35 U/L 19 27 20   ALT 6 - 29 U/L 19 27 20     Imaging: No results found.  Speciality Comments: No specialty comments available.    Procedures:  No procedures performed Allergies: Other and Theophylline   Assessment / Plan:     Visit Diagnoses: Seropositive rheumatoid arthritis of multiple sites ([place]) - +RF, +CCP, h/o elevated sed rate. Patient has no active synovitis today. She's been tolerating methotrexate well.  High risk medication use - MTX 0.3 mL subcutaneous every week, folic acid 1 mg by mouth daily - Plan: CBC with Differential/Platelet, COMPLETE METABOLIC PANEL WITH GFR. Her labs have been stable. We will continue to monitor labs every 3 months. A prescription refill for methotrexate was given today.  Mass of skin of left shoulder: Clinical findings are consistent with lipoma. Patient wants to have lipoma resected. I discussed this further with Dr. Durward Fortes in the office today. He examined the mass and discussed possible surgery with  patient in the future. Patient reports that she will try to schedule surgery sometimes in January. She will also get clearance for surgery from Dr. [name].  Osteopenia of multiple sites: Patient has been taking calcium and vitamin D.  Other medical problems are listed as follows:  History of hypertension: Her blood pressure was elevated today. She states she's not been taking Diovan. I discussed the need for taking her medication regular basis and monitor blood pressure.  History of COPD: She's been taking albuterol and followed up by Dr. [name].  CERVICAL CANCER, HX OF  History of hyperlipidemia    Orders: Orders Placed This Encounter  Procedures  . CBC with Differential/Platelet  . COMPLETE METABOLIC PANEL WITH GFR   No orders of the defined types were placed in this encounter.   Face-to-face time spent with patient was 30 minutes. Greater than 50% of time was spent in counseling and coordination of care.  Follow-Up Instructions:  Return in about 5 months (around 11/01/2017) for Rheumatoid arthritis.   Bo Merino, MD  Note - This record has been created using Editor, commissioning.  Chart creation errors have been sought, but may not always  have been located. Such creation errors do not reflect on  the standard of medical care.

## 2017-05-24 ENCOUNTER — Telehealth: Payer: Self-pay | Admitting: Pulmonary Disease

## 2017-05-24 MED ORDER — ALBUTEROL SULFATE HFA 108 (90 BASE) MCG/ACT IN AERS: 1.0000 | INHALATION_SPRAY | Freq: Four times a day (QID) | RESPIRATORY_TRACT | 6 refills | Status: DC | PRN

## 2017-05-24 MED ORDER — BUDESONIDE-FORMOTEROL FUMARATE 160-4.5 MCG/ACT IN AERO: INHALATION_SPRAY | RESPIRATORY_TRACT | 6 refills | Status: DC

## 2017-05-24 NOTE — Telephone Encounter (Signed)
Pt is requesting albuterol and Symbicort refill, as she is currently out of both medications.  Pt has pending apt with SN on 06/28/16. Rx has been sent to preferred pharmacy. Nothing further needed.

## 2017-06-03 ENCOUNTER — Ambulatory Visit (INDEPENDENT_AMBULATORY_CARE_PROVIDER_SITE_OTHER): Payer: Medicare HMO | Admitting: Rheumatology

## 2017-06-03 ENCOUNTER — Encounter: Payer: Self-pay | Admitting: Rheumatology

## 2017-06-03 ENCOUNTER — Other Ambulatory Visit: Payer: Self-pay | Admitting: Rheumatology

## 2017-06-03 VITALS — BP 186/85 | HR 90 | Resp 18 | Ht 67.0 in | Wt 259.0 lb

## 2017-06-03 DIAGNOSIS — M8589 Other specified disorders of bone density and structure, multiple sites: Secondary | ICD-10-CM | POA: Diagnosis not present

## 2017-06-03 DIAGNOSIS — Z8679 Personal history of other diseases of the circulatory system: Secondary | ICD-10-CM

## 2017-06-03 DIAGNOSIS — R2232 Localized swelling, mass and lump, left upper limb: Secondary | ICD-10-CM | POA: Diagnosis not present

## 2017-06-03 DIAGNOSIS — Z8541 Personal history of malignant neoplasm of cervix uteri: Secondary | ICD-10-CM

## 2017-06-03 DIAGNOSIS — Z8709 Personal history of other diseases of the respiratory system: Secondary | ICD-10-CM | POA: Diagnosis not present

## 2017-06-03 DIAGNOSIS — Z8639 Personal history of other endocrine, nutritional and metabolic disease: Secondary | ICD-10-CM

## 2017-06-03 DIAGNOSIS — Z79899 Other long term (current) drug therapy: Secondary | ICD-10-CM

## 2017-06-03 DIAGNOSIS — M0579 Rheumatoid arthritis with rheumatoid factor of multiple sites without organ or systems involvement: Secondary | ICD-10-CM | POA: Diagnosis not present

## 2017-06-03 NOTE — Patient Instructions (Signed)
Standing Labs We placed an order today for your standing lab work.    Please come back and get your standing labs in February and every 3 months  We have open lab Monday through Friday from 8:30-11:30 AM and 1:30-4 PM at the office of Dr. Ammy Lienhard.   The office is located at [address], [address], [place], [place] [address] No appointment is necessary.   Labs are drawn by [facility].  You may receive a bill from [facility] for your lab work. If you have any questions regarding directions or hours of operation,  please call [phone].    

## 2017-06-04 NOTE — Telephone Encounter (Signed)
Last Visit: 06/03/17 Next visit: 11/04/17 Labs: 05/11/17 WNL  Okay to refill per Dr. [name]

## 2017-06-28 ENCOUNTER — Ambulatory Visit (INDEPENDENT_AMBULATORY_CARE_PROVIDER_SITE_OTHER)
Admission: RE | Admit: 2017-06-28 | Discharge: 2017-06-28 | Disposition: A | Discharge status: Home / Self Care | Payer: Medicare HMO | Source: Ambulatory Visit | Attending: Pulmonary Disease | Admitting: Pulmonary Disease

## 2017-06-28 ENCOUNTER — Encounter: Payer: Self-pay | Admitting: Pulmonary Disease

## 2017-06-28 ENCOUNTER — Ambulatory Visit: Payer: Medicare HMO | Admitting: Pulmonary Disease

## 2017-06-28 VITALS — BP 146/86 | HR 80 | Temp 97.9°F | Ht 67.0 in | Wt 262.2 lb

## 2017-06-28 DIAGNOSIS — I10 Essential (primary) hypertension: Secondary | ICD-10-CM | POA: Diagnosis not present

## 2017-06-28 DIAGNOSIS — R69 Illness, unspecified: Secondary | ICD-10-CM | POA: Diagnosis not present

## 2017-06-28 DIAGNOSIS — J383 Other diseases of vocal cords: Secondary | ICD-10-CM

## 2017-06-28 DIAGNOSIS — J449 Chronic obstructive pulmonary disease, unspecified: Secondary | ICD-10-CM | POA: Diagnosis not present

## 2017-06-28 DIAGNOSIS — M0579 Rheumatoid arthritis with rheumatoid factor of multiple sites without organ or systems involvement: Secondary | ICD-10-CM | POA: Diagnosis not present

## 2017-06-28 MED ORDER — ALBUTEROL SULFATE HFA 108 (90 BASE) MCG/ACT IN AERS: 1.0000 | INHALATION_SPRAY | Freq: Four times a day (QID) | RESPIRATORY_TRACT | 6 refills | Status: DC | PRN

## 2017-06-28 MED ORDER — BUDESONIDE-FORMOTEROL FUMARATE 160-4.5 MCG/ACT IN AERO: INHALATION_SPRAY | RESPIRATORY_TRACT | 6 refills | Status: DC

## 2017-06-28 MED ORDER — ALBUTEROL SULFATE (2.5 MG/3ML) 0.083% IN NEBU: INHALATION_SOLUTION | RESPIRATORY_TRACT | 2 refills | Status: DC

## 2017-06-28 NOTE — Progress Notes (Addendum)
Subjective:     Patient ID: Tina Hansen, female   DOB: 1940/10/09, 77 y.o.   MRN: [id number]  HPI  ~  10/06/07:  Initial pulmonary consultation by KC> Referred by: Freda Munro Chief Complaint: Pulmonary Consult. History of Present Illness: The patient is a 77 year old female who I have been asked to see for the evaluation of asthma. The patient was diagnosed with asthma in 1992, and was started on Advair in 1999. The patient states that her symptoms have been extremely severe over the last 5 years, and she uses albuterol rescue two to 5 times a day, every day. The patient admits that she does not sit down to rest when she becomes dyspneic prior to using her albuterol inhaler. The patient denies significant cough, and has only had reflux symptoms on rare occasions. She denies any chronic nasal symptoms, and has no significant allergy issues as well. She will get dyspneic with mild to moderate activity. It should also be noted, the patient has had extreme hoarseness for years. She has been on lisinopril during this time. She does feel that her throat closes in on numerous occasions, and describes the rapid onset of severe dyspnea without warning. Current Allergies: ! THEOPHYLLINE Past Medical History:  CERVICAL CANCER, HX OF (ICD-V10.41)  HYPERTENSION (ICD-401.9)  HYPERLIPIDEMIA (ICD-272.4)  ASTHMA (ICD-493.90) Past Surgical History:  status post hysterectomy Impression & Recommendations: Problem # 1: ASTHMA (ICD-493.90) the patient has severe airflow obstruction by spirometry today in the office. She may have an element of asthma, but I also wonder about emphysema. I think it would be worthwhile adding a trial of Spiriva to her regimen. I would also like to get records from her old pulmonologist to compare prior PFTs. I think the patient is overusing albuterol, and I have stressed to her the need to sit down and rest prior to using the inhaler. A lot of her  dyspnea on exertion may be due to her morbid obesity and deconditioning. Problem # 2: OTHER DISEASES OF VOCAL CORDS (ICD-478.5) the patient has a classic history for vocal cord dysfunction. She is extremely hoarse, and describes the sudden onset of dyspnea with symptoms that are suggestive of laryngospasm. I have tried to explain to her that bronchodilators will not help with this issue. The patient is on lisinopril which we should definitely discontinue. She is also having difficulty with the dry powder from Advair, and therefore will initiate treatment with symbicort through a spacer. I have also demonstrated for her purse lip breathing, which often helps during an attack of vocal cord dysfunction. The patient denies difficulty with reflux disease, but I wonder if this is more of an issue and she suspects. Medications Added to Medication List This Visit: 1) Lisinopril 20 Mg Tabs (Lisinopril) .... Take 1 tablet by mouth once a day 2) Advair Diskus 250-50 Mcg/dose Misc (Fluticasone-salmeterol) .... Inhale 1 puff two times a day 3) Albuterol 90 Mcg/act Aers (Albuterol) .... Inhale 2 puffs every 4 hours as needed 4) Albuterol Neb .... Use as needed 5) Multivitamins Tabs (Multiple vitamin) .... Take 1 tablet by mouth once a day 6) Iodine Liquid .... Put 5 drops in a liquid and drink once daily 7) Fish Oil 1000 Mg Caps (Omega-3 fatty acids) .... Take 2 tabs by mouth once daily 8) Lecithin 2066m .... Take 1 tablet by mouth once a day       LAST 2 office visits:   ~  10/23/13:  6 month ROV w/ KC>  The patient comes in today for followup of her known COPD, felt secondary to both asthma and emphysema. She is maintained on Symbicort twice a day, and feels that her breathing is at her usual baseline. She has not had a flare up of her disease, nor has she had a recent pulmonary infection. She continues to have chronic dyspnea on exertion, but has not been working on weight loss or  conditioning.      REC>  The patient is staying on Symbicort compliantly, and feels that her breathing is at baseline. He does not appear that she has ever been tried on an anti-cholinergic, but I suspect that is because we have been avoiding dry powder in the past which irritates her upper airway. Now that Spiriva is an an aerosol form, I would like to try her on this for a few weeks to see if she sees a difference. Ultimately, I think that her breathing will be most improved on weight loss and conditioning, as well as improved fluid balance.   ~  04/23/14:  6 month ROV w/ KC>        Patient comes in today for follow-up of her known COPD, felt secondary to asthma with a component of emphysema. She was tried on Spiriva added to her Symbicort at the last visit, but did not see an appreciable difference in her symptoms. She is asking about using Spiriva as needed, but I explained this is a maintenance medication. She has not had any acute exacerbation or pulmonary infection since the last visit.      REC>  The patient appears to be at baseline from a pulmonary standpoint. She is on Symbicort alone, and really did not see a big difference with the addition of an anticholinergic. I've asked her to continue on her current medication, and to call if she has increased rescue inhaler use. I've also stressed to her the importance of aggressive weight loss and working on some type of conditioning program.   ~  April 29, 2015:  46yrROV w/ SN>  PCP- Dr. JTamsen Roersin [facility] DrDeveshwar, prev ENT- DrByers...      77y/o WF w/ hx COPD (emphysema & a rev component), VCD, obesity, RA... She has been controlled on Symbicort160, Albuterol NEBS and HFA but she is pretty set in her ways and only uses her meds as she feels she needs them- eg Symbicort every other day, AlbutHFA w/ activity, & Albut NEB w/ URIs;  She notes that leaves and molds "get me good" and so does burning outside & the smell of soap in the  grocery store has a similar effect;  She is content w/ this regimen and wants to continue same...      She is an ex-smoker> from teens to her 484sup to 1.5ppd for ~40 pack-yr smoking hx & quit in 1987;  She claims she got asthma after she quit, but had no prob while smoking;  She's had pneumonia in the past;  She has a hx hoarseness & dx w/ muscle tension dysphonia/ VCD per DrByers;        She takes DiovanHCT for BP & MTX for the RA... EXAM shows Afeb, VSS, O2sat=96% on RA;  Wt=263#, BMI=42;  HEENT- neg, mallampati2;  Chest- decr BS, clear w/o w/r/r;  Heart- RR w/o m/r/g;  Abd- obese w/ panniculus, soft, neg;  Ext- on MTX per DrDeveshwar for RA, VI, tr edema, no c/c...  CXR 04/29/15>  Hyperinflated, sl flattening  of diaph & incr in AP diameter, norm heart size, clear- NAD, DJD Tspine w/ osteophytes...  Spirometry 04/29/15>  FVC=2.05 (65%), FEV1=1.01 (43%), %1sec=49, mid-flows reduced at 30% predicted;  This is c/w SEVERE airflow obstruction and GOLD Stage3 COPD...  Ambulatory Oximetry 04/29/15>  O2sat=97% on RA at rest w/ pulse=81/min;  She ambulated 3 Laps in the office (185'ea) w/ lowest O2sat=90% w/ pulse=109/min IMP/PLAN>>  Tina Hansen has severe COPD and would be better served by taking her meds regularly- ie Symbicort160- 2spBid;  She has Albut both via NEB & HFA inhaler to use prn for wheezing/ SOB/ etc;  We may need to add an anticholinergic but she does not want additional meds at this time;  Ultimately her breathing would improve w/ diet/ exercise/ wt reduction!  ~  May 04, 2016:  1year ROV w/ SN>  Tina Hansen returns & denies interval resp issues- notes min cough & phlegm, no hemoptysis, chr stable DOE w/o change x yrs she says (eg- walking to mailbox, "heavy" housework, etc); her major triggers are upper resp infections & strong odors, and she denies (not aware of) reflux/ LPR/ etc (not on an antireflux regimen)... We discussed re-checking CXR/ PFT/ Labs but she declines and does not want change in  meds or addition of an anticholinergic to her regimen (another consideration would be a switch to the new once daily TRELEGY)... Note: she takes mult supplements including milk thistle, chromium, & homeopathic "voice-B10" that a friend told her about; she also does a "liver cleanse" twice yearly (grapefruits juice, epsom salts, olive oil) because "it shoots the gallstones out"    EXAM shows Afeb, VSS, O2sat=96% on RA;  Wt=266#, BMI=42;  HEENT- neg, mallampati2;  Chest- decr BS, clear w/o w/r/r;  Heart- RR w/o m/r/g;  Abd- obese w/ panniculus, soft, neg;  Ext- on MTX per DrDeveshwar for RA, VI, tr edema, no c/c... IMP/PLAN>>  Tina Hansen remains stable on Symbicort160, Albut NEBS and HFA as needed; but she has severe obstructive lung dis & is symptomatic (albeit stable)- she declines additional meds or change in her regimen and we have again discussed diet/ exercise/ wt reduction in addition to taking her meds regularly a prescribed & checking w/ her PCP regarding all indicated regular vaccinations...   ~  June 28, 2017:  4moROV & pulmonary follow up visit>  Tina Hansen for pulmonary follow up visit;  She has hx COPD (emphysema & a rev component), ex-smoker w/ a 40 pack year hx & quit in 1987, VCD, obesity, RA (followed by DrDeveshwar);  She remains stable on Symbicort160-2spBid, NEB w/ albuterol prn & ProairHFA prn;  She has been reluctant to change this regimen since she has been doing so well over the yrs;  Currently she states that her breathing is good, notes a runny nose (esp in AM) and some incr mucus; admits to a sl cough in AM, small amt of whitish sput, no hemoptysis; she denies SOB at rest & w/ her ADLs but has chr stable DOE w/ activity like walking & stairs; she denies CP/ palpit/ edema;  Tina Hansen remains overweight at 262#, 5'7"Tall, BMI=41 and she is not motivated to diet/ exercise/ get the weight down- [facility] at [facility] (says she will look  for a treadmill to use)...     COPD- GOLD Stage3 COPD by Spirometry in 2016> on Symbicort160-2spBid + NEBS w/ Albut prn & ProairHFA prn; she knows to add-in MUCINEX 6030mQid as needed  for chest congestion...    AR, multiple sensitivities, & an asthmatic component to her airway dis...    Ex-smoker> quit in 1987    VCD    Morbid Obesity    R/O OSA but she declines sleep study or further eval of poss sleep disordered breathing...    RA> on treatment from DrDeveshwar (currently MTX injections weekly) NOTE: she is sched for left shoulder surg for removal of a ?lipoma by DrWhitfield >> she is cleared from the pulmonary standpoint but will need f/u w/ her PCP Dr.Little for EKG and pre-operative blood work... EXAM shows Afeb, VSS- BP=146/86, O2sat=94% on RA;  Wt=262#, BMI=41;  HEENT- neg, mallampati2;  Chest- decr BS, clear w/o w/r/r;  Heart- RR w/o m/r/g;  Abd- obese w/ panniculus, soft, neg;  Ext- on MTX per DrDeveshwar for RA, VI, tr edema, no c/c...  CXR 06/28/17 (independently reviewed by me in the PACS system) shows norm heart size, large lung volumes c/w COPD- clear w/o opacities/ edema/ effusions- NAD, DJD in Tspine... IMP/PLAN>>  Tina Hansen has mod COPD & needs to take the Symbicort regularly (she does not want to consider additional or diff meds at this time); she also desperately needs to lose the weight and increase her exercise program, we discussed [facility] but she is not interested; She has done a good job avoiding infections and COPD exacerbations;  She will check w/ her PCP regarding her current vaccination status to be sure that she is up to date...     Past Medical History:  Diagnosis Date  . Asthma   . Cervical cancer ([place])   . Hyperlipidemia   . Hypertension   . Rheumatoid arthritis(714.0)    [name]    Past Surgical History:  Procedure Laterality Date  . ABDOMINAL HYSTERECTOMY    . TUBAL LIGATION    . TUMOR REMOVAL Right    FOREARM    Outpatient Encounter  Medications as of 06/28/2017  Medication Sig  . albuterol (PROVENTIL) (2.5 MG/3ML) 0.083% nebulizer solution USE ONE VIAL IN NEBULIZER EVERY 6 HOURS AS NEEDED  . albuterol (VENTOLIN HFA) 108 (90 Base) MCG/ACT inhaler Inhale 1-2 puffs into the lungs every 6 (six) hours as needed for wheezing or shortness of breath.  . Biotin 5000 MCG CAPS Take 10,000 mcg by mouth daily.  . budesonide-formoterol (SYMBICORT) 160-4.5 MCG/ACT inhaler INHALE TWO PUFFS BY  MOUTH ONCE TO TWICE DAILY  . calcium gluconate 500 MG tablet Take 500 mg by mouth daily.    . Cholecalciferol (VITAMIN D3) 1000 units CAPS Take 1 capsule by mouth daily.  . Chromium 1 MG CAPS Take 1 mg by mouth daily. Increase to 2 caps if needed  . Coenzyme Q10 (COQ-10) 100 MG CAPS Take 1 capsule by mouth daily.    . fish oil-omega-3 fatty acids 1000 MG capsule Take 2 capsules by mouth daily.    . folic acid (FOLVITE) 1 MG tablet TAKE ONE TABLET BY MOUTH ONCE DAILY  . IODINE, KELP, PO Put 5 drops in a liquid and drink once daily.  . methotrexate 50 MG/2ML injection INJECT 0.3 ML (CC) ONCE A WEEK  . Milk Thistle 200 MG CAPS Take 400 mg by mouth daily.    . Multiple Vitamin (MULTIVITAMIN) capsule Take 1 capsule by mouth daily.    . NON FORMULARY Take 1 capsule by mouth daily. Collagen capsule  . Tuberculin-Allergy Syringes 27G X 1/2" 1 ML KIT Inject 1 Syringe into the skin once a week. If syringe size greater  than 88m, please call me at my office.  . valsartan-hydrochlorothiazide (DIOVAN-HCT) 80-12.5 MG per tablet Take 1 tablet by mouth daily.    . vitamin E (VITAMIN E) 400 UNIT capsule Take 400 Units by mouth daily.   No facility-administered encounter medications on file as of 06/28/2017.     Allergies  Allergen Reactions  . Other     Lamisil  . Theophylline     REACTION: fever    Immunization History  Administered Date(s) Administered  . Pneumococcal Polysaccharide-23 06/27/2007, 03/22/2009  SHE NEEDS TO BE SURE THAT SHE IS UP-TO-DATE ON  HER VACCINES...   Family History  Problem Relation Age of Onset  . Asthma Mother   . Breast cancer Mother   Mother developed ASTHMA [place] in [place] never got [place] it according to the pt...   Social History   Socioeconomic History  . Marital status: Divorced    Spouse name: Not on file  . Number of children: Y  . Years of education: Not on file  . Highest education level: Not on file  Social Needs  . Financial resource strain: Not on file  . Food insecurity - worry: Not on file  . Food insecurity - inability: Not on file  . Transportation needs - medical: Not on file  . Transportation needs - non-medical: Not on file  Occupational History  . Occupation: retired  Tobacco Use  . Smoking status: Former Smoker    Packs/day: 1.00    Years: 30.00    Pack years: 30.00    Types: Cigarettes    Last attempt to quit: 06/22/1985    Years since quitting: 32.0  . Smokeless tobacco: Never Used  Substance and Sexual Activity  . Alcohol use: Yes    Comment: OCCASSIONAL GLASS OF WINE 1-2 MONTHLY  . Drug use: No  . Sexual activity: Not on file  Other Topics Concern  . Not on file  Social History Narrative  . Not on file    Current Medications, Allergies, Past Medical History, Past Surgical History, Family History, and Social History were reviewed in [employer].   Review of Systems             All symptoms NEG except where BOLDED >>  Constitutional:  F/C/S, fatigue, anorexia, unexpected weight change. HEENT:  HA, visual changes, hearing loss, earache, nasal symptoms, sore throat, mouth sores, hoarseness. Resp:  cough, sputum, hemoptysis; SOB, tightness, wheezing. Cardio:  CP, palpit, DOE, orthopnea, edema. GI:  N/V/D/C, blood in stool; reflux, abd pain, distention, gas. GU:  dysuria, freq, urgency, hematuria, flank pain, voiding difficulty. MS:  joint pain, swelling, tenderness, decr ROM; neck pain, back pain, etc. Neuro:  HA, tremors,  seizures, dizziness, syncope, weakness, numbness, gait abn. Skin:  suspicious lesions or skin rash. Heme:  adenopathy, bruising, bleeding. Psyche:  confusion, agitation, sleep disturbance, hallucinations, anxiety, depression suicidal.   Objective:   Physical Exam       Vital Signs:  Reviewed...   General:  WD, WN, 77y/o WF in NAD; alert & oriented; pleasant & cooperative... HEENT:  [place]/AT; Conjunctiva- pink, Sclera- nonicteric, EOM-wnl, PERRLA, EACs-clear, TMs-wnl; NOSE-clear; THROAT-clear & wnl.  Neck:  Supple w/ fair ROM; no JVD; normal carotid impulses w/o bruits; no thyromegaly or nodules palpated; no lymphadenopathy.  Chest:  Clear to P & A; decr BS bilat, without wheezes/ rales/ or rhonchi heard. Heart:  Regular Rhythm; norm S1 & S2 without murmurs, rubs, or gallops detected. Abdomen:  Soft &  nontender, obese 2w/ panniculus- no guarding or rebound; normal bowel sounds; no organomegaly or masses palpated. Ext:  Decr ROM; without deformities +arthritic changes; no varicose veins, +venous insuffic, tr edema;  Pulses intact w/o bruits. Neuro:  CNs II-XII intact; motor testing normal; sensory testing normal; gait normal & balance OK. Derm:  No lesions noted; no rash etc. Lymph:  No cervical, supraclavicular, axillary, or inguinal adenopathy palpated.   Assessment:      IMP>>     COPD w/ reversible component> on Symbicort160-2spBid, Albuterol by NEB & HFA inhaler as needed...    Ex-smoker, quit 1987    VCD, muscle tension dysphonia> prev eval by DrByers    Obesity, r/o OSA    Medical issues>  HBP, HL, Obesity, hx cervical cancer, RA, anxiety  PLAN>>      Tina Hansen has severe COPD and would be better served by taking her meds regularly- ie Symbicort160- 2spBid;  She has Albut both via NEB & HFA inhaler to use prn for wheezing/ SOB/ etc;  We may need to add an anticholinergic but she does not want additional meds at this time;  Ultimately her breathing would improve w/ diet/ exercise/ wt  reduction! 05/04/16>   Tina Hansen remains stable on Symbicort160, Albut NEBS and HFA as needed; but she has severe obstructive lung dis & is symptomatic (albeit stable)- she declines additional meds or change in her regimen and we have again discussed diet/ exercise/ wt reduction in addition to taking her meds regularly a prescribed & checking w/ her PCP regarding all indicated regular vaccinations...  06/28/17>   Tina Hansen has mod COPD & needs to take the Symbicort regularly (she does not want to consider additional or diff meds at this time); she also desperately needs to lose the weight and increase her exercise program, we discussed [facility] but she is not interested;  Similarly she is not interested in persuingShe has done a good job avoiding infections and COPD exacerbations;  She will check w/ her PCP regarding her current vaccination status to be sure that she is up to date...      Plan:       Medication List        Accurate as of 06/28/17  1:46 PM. Always use your most recent med list.          * albuterol 108 (90 Base) MCG/ACT inhaler Commonly known as:  VENTOLIN HFA Inhale 1-2 puffs into the lungs every 6 (six) hours as needed for wheezing or shortness of breath.   * albuterol (2.5 MG/3ML) 0.083% nebulizer solution Commonly known as:  PROVENTIL USE ONE VIAL IN NEBULIZER EVERY 6 HOURS AS NEEDED   Biotin 5000 MCG Caps   budesonide-formoterol 160-4.5 MCG/ACT inhaler Commonly known as:  SYMBICORT INHALE TWO PUFFS BY  MOUTH ONCE TO TWICE DAILY   calcium gluconate 500 MG tablet   Chromium 1 MG Caps   CoQ-10 100 MG Caps   fish oil-omega-3 fatty acids 6568 MG capsule   folic acid 1 MG tablet Commonly known as:  FOLVITE TAKE ONE TABLET BY MOUTH ONCE DAILY   IODINE (KELP) PO   methotrexate 50 MG/2ML injection INJECT 0.3 ML (CC) ONCE A WEEK   Milk Thistle 200 MG Caps   multivitamin capsule   NON FORMULARY   Tuberculin-Allergy Syringes 27G X 1/2" 1 ML Kit Inject  1 Syringe into the skin once a week. If syringe size greater than 14m, please call me at my office.   valsartan-hydrochlorothiazide 80-12.5  MG tablet Commonly known as:  DIOVAN-HCT   Vitamin D3 1000 units Caps   vitamin E 400 UNIT capsule Generic drug:  vitamin E      * This list has 2 medication(s) that are the same as other medications prescribed for you. Read the directions carefully, and ask your doctor or other care provider to review them with you.          Where to Get Your Medications    These medications were sent to [facility] (SE), [place] [address]  [address],  ([place]) [place] [address]   Phone:  [phone]   albuterol (2.5 MG/3ML) 0.083% nebulizer solution  albuterol 108 (90 Base) MCG/ACT inhaler  budesonide-formoterol 160-4.5 MCG/ACT inhaler

## 2017-06-28 NOTE — Patient Instructions (Signed)
Today we updated your med list in our EPIC system...    Continue your current medications the same...  We refilled your meds per request...  Today we rechecked your CXR...    We will contact you w/ the results when available...   We will send a note to drWhitfield regarding your up-coming surgery...  Call for any questions or if we can be of service in any way.Marland KitchenMarland Kitchen

## 2017-07-29 ENCOUNTER — Telehealth (INDEPENDENT_AMBULATORY_CARE_PROVIDER_SITE_OTHER): Payer: Self-pay | Admitting: Orthopaedic Surgery

## 2017-07-29 ENCOUNTER — Other Ambulatory Visit: Payer: Self-pay

## 2017-07-29 DIAGNOSIS — D1722 Benign lipomatous neoplasm of skin and subcutaneous tissue of left arm: Secondary | ICD-10-CM | POA: Diagnosis not present

## 2017-07-29 NOTE — Telephone Encounter (Signed)
Tiffany from [facility] called stating that the prescription sent over for Hydrocodone exceeds the allowed amount.  She states that it is written for 1-2 every 4-6 hours and it needs to be rewritten for 1-2 every 6 hours.  Please call Tiffany at [phone]

## 2017-07-29 NOTE — Telephone Encounter (Signed)
Please advise 

## 2017-08-02 ENCOUNTER — Other Ambulatory Visit: Payer: Self-pay

## 2017-08-02 ENCOUNTER — Ambulatory Visit (INDEPENDENT_AMBULATORY_CARE_PROVIDER_SITE_OTHER): Payer: Medicare HMO | Admitting: Orthopaedic Surgery

## 2017-08-02 ENCOUNTER — Telehealth: Payer: Self-pay | Admitting: Pulmonary Disease

## 2017-08-02 ENCOUNTER — Encounter (INDEPENDENT_AMBULATORY_CARE_PROVIDER_SITE_OTHER): Payer: Self-pay | Admitting: Orthopaedic Surgery

## 2017-08-02 DIAGNOSIS — Z79899 Other long term (current) drug therapy: Secondary | ICD-10-CM | POA: Diagnosis not present

## 2017-08-02 DIAGNOSIS — M25512 Pain in left shoulder: Secondary | ICD-10-CM

## 2017-08-02 DIAGNOSIS — G8929 Other chronic pain: Secondary | ICD-10-CM

## 2017-08-02 NOTE — Telephone Encounter (Signed)
Placed with SN paperwork. Routing to [place] for follow up.

## 2017-08-02 NOTE — Progress Notes (Signed)
   Office Visit Note   Patient: Tina Hansen           Date of Birth: 06/05/1941           MRN: [id number] Visit Date: 08/02/2017              Requested by: Tamsen Roers, [place], [place] [id number] PCP: Tamsen Roers, MD   Assessment & Plan: Visit Diagnoses:  1. Chronic left shoulder pain     Plan: 4 days status post excision of a mass from her left shoulder. Pathology report is consistent with a lipoma. Doing well without any significant pain. Removed sling ,begin range of motion exercises. Office 1 week  Follow-Up Instructions: Return in about 1 week (around 08/09/2017).   Orders:  No orders of the defined types were placed in this encounter.  No orders of the defined types were placed in this encounter.     Procedures: No procedures performed   Clinical Data: No additional findings.   Subjective: Chief Complaint  Patient presents with  . Left Shoulder - Routine Post Op    Tina Hansen is a 77 y o S/P 4 days left shoulder arthroplasty.    HPI  Review of Systems   Objective: Vital Signs: There were no vitals taken for this visit.  Physical Exam  Ortho Exam incision left shoulder is clean and dry and no drainage. No pain. No erythema or ecchymosis. Neurovascular exam intact.  Specialty Comments:  No specialty comments available.  Imaging: No results found.   PMFS History: Patient Active Problem List   Diagnosis Date Noted  . COPD mixed type ([place]) 06/28/2017  . History of COPD 06/26/2016  . History of hypertension 06/26/2016  . Osteopenia of multiple sites 06/26/2016  . Morbid obesity ([place]) 05/04/2016  . High risk medication use 04/25/2016  . Vocal cord dysfunction 04/29/2015  . Seropositive rheumatoid arthritis of multiple sites ([place]) 04/29/2015  . HYPERLIPIDEMIA 10/06/2007  . Essential hypertension 10/06/2007  . OTHER DISEASES OF VOCAL CORDS 10/06/2007  . CERVICAL CANCER, HX OF 10/06/2007   Past Medical History:    Diagnosis Date  . Asthma   . Cervical cancer ([place])   . Hyperlipidemia   . Hypertension   . Rheumatoid arthritis(714.0)    Tina Hansen    Family History  Problem Relation Age of Onset  . Asthma Mother   . Breast cancer Mother     Past Surgical History:  Procedure Laterality Date  . ABDOMINAL HYSTERECTOMY    . TUBAL LIGATION    . TUMOR REMOVAL Right    FOREARM   Social History   Occupational History  . Occupation: retired  Tobacco Use  . Smoking status: Former Smoker    Packs/day: 1.00    Years: 30.00    Pack years: 30.00    Types: Cigarettes    Last attempt to quit: 06/22/1985    Years since quitting: 32.1  . Smokeless tobacco: Never Used  Substance and Sexual Activity  . Alcohol use: Yes    Comment: OCCASSIONAL GLASS OF WINE 1-2 MONTHLY  . Drug use: No  . Sexual activity: Not on file     Tina Balding, MD   Note - This record has been created using [employer].  Chart creation errors have been sought, but may not always  have been located. Such creation errors do not reflect on  the standard of medical care.

## 2017-08-03 LAB — COMPLETE METABOLIC PANEL WITH GFR
AG Ratio: 1.5 (calc) (ref 1.0–2.5)
ALBUMIN MSPROF: 4.2 g/dL (ref 3.6–5.1)
ALT: 14 U/L (ref 6–29)
AST: 16 U/L (ref 10–35)
Alkaline phosphatase (APISO): 58 U/L (ref 33–130)
BUN: 21 mg/dL (ref 7–25)
CALCIUM: 9.3 mg/dL (ref 8.6–10.4)
CO2: 24 mmol/L (ref 20–32)
CREATININE: 0.93 mg/dL (ref 0.60–0.93)
Chloride: 106 mmol/L (ref 98–110)
GFR, EST NON AFRICAN AMERICAN: 60 mL/min/{1.73_m2} (ref >=60)
GFR, Est African American: 69 mL/min/{1.73_m2} (ref >=60)
GLOBULIN: 2.8 g/dL (ref 1.9–3.7)
GLUCOSE: 119 mg/dL — AB (ref 65–99)
Potassium: 4.1 mmol/L (ref 3.5–5.3)
SODIUM: 140 mmol/L (ref 135–146)
Total Bilirubin: 0.4 mg/dL (ref 0.2–1.2)
Total Protein: 7 g/dL (ref 6.1–8.1)

## 2017-08-03 LAB — CBC WITH DIFFERENTIAL/PLATELET
BASOS ABS: 64 {cells}/uL (ref 0–200)
Basophils Relative: 0.8 %
EOS ABS: 160 {cells}/uL (ref 15–500)
Eosinophils Relative: 2 %
HCT: 44.6 % (ref 35.0–45.0)
HEMOGLOBIN: 15 g/dL (ref 11.7–15.5)
LYMPHS ABS: 1128 {cells}/uL (ref 850–3900)
MCH: 29.6 pg (ref 27.0–33.0)
MCHC: 33.6 g/dL (ref 32.0–36.0)
MCV: 88 fL (ref 80.0–100.0)
MPV: 10.6 fL (ref 7.5–12.5)
Monocytes Relative: 6.6 %
NEUTROS ABS: 6120 {cells}/uL (ref 1500–7800)
Neutrophils Relative %: 76.5 %
Platelets: 247 10*3/uL (ref 140–400)
RBC: 5.07 10*6/uL (ref 3.80–5.10)
RDW: 13.7 % (ref 11.0–15.0)
Total Lymphocyte: 14.1 %
WBC mixed population: 528 cells/uL (ref 200–950)
WBC: 8 10*3/uL (ref 3.8–10.8)

## 2017-08-03 NOTE — Telephone Encounter (Signed)
Handicap placard signed and placed in envelope for Patient.  Patient called and notified. Nothing further needed.

## 2017-08-03 NOTE — Telephone Encounter (Signed)
Handicap placard will be placed at desk for patient to pick up. I will call the Patient after SN signs.

## 2017-08-09 ENCOUNTER — Ambulatory Visit (INDEPENDENT_AMBULATORY_CARE_PROVIDER_SITE_OTHER): Payer: Medicare HMO | Admitting: Orthopaedic Surgery

## 2017-08-09 ENCOUNTER — Encounter (INDEPENDENT_AMBULATORY_CARE_PROVIDER_SITE_OTHER): Payer: Self-pay | Admitting: Orthopaedic Surgery

## 2017-08-09 VITALS — Resp 18 | Ht 68.0 in | Wt 260.0 lb

## 2017-08-09 DIAGNOSIS — D1722 Benign lipomatous neoplasm of skin and subcutaneous tissue of left arm: Secondary | ICD-10-CM

## 2017-08-09 NOTE — Progress Notes (Signed)
Office Visit Note   Patient: Tina Hansen           Date of Birth: December 08, 1940           MRN: [id number] Visit Date: 08/09/2017              Requested by: Tamsen Roers, [place],  [id number] PCP: Tamsen Roers, MD   Assessment & Plan: Visit Diagnoses:  1. Lipoma of left upper extremity     Plan: 11 days status post excision of a lipoma from the left shoulder doing very well. No pain. Full range of motion. Wound is healing nicely. We'll see back as needed  Follow-Up Instructions: Return if symptoms worsen or fail to improve.   Orders:  No orders of the defined types were placed in this encounter.  No orders of the defined types were placed in this encounter.     Procedures: No procedures performed   Clinical Data: No additional findings.   Subjective: Chief Complaint  Patient presents with  . Left Shoulder - Routine Post Op    Tina Hansen is a 77 y o S/P 11 days Left mass excision. She relates little to no pain.  Pathology report consistent with lipoma  HPI  Review of Systems  Constitutional: Negative for chills, fatigue and fever.  HENT: Negative for hearing loss and tinnitus.   Eyes: Negative for itching.  Respiratory: Positive for shortness of breath. Negative for chest tightness.   Cardiovascular: Negative for chest pain, palpitations and leg swelling.  Gastrointestinal: Negative for blood in stool, constipation and diarrhea.  Endocrine: Negative for polyuria.  Genitourinary: Negative for dysuria.  Musculoskeletal: Negative for back pain, joint swelling, neck pain and neck stiffness.  Allergic/Immunologic: Negative for immunocompromised state.  Neurological: Negative for dizziness, numbness and headaches.  Hematological: Does not bruise/bleed easily.  Psychiatric/Behavioral: Negative for sleep disturbance. The patient is not nervous/anxious.      Objective: Vital Signs: Resp 18   Ht 5\' 8"  (1.727 m)   Wt 260 lb (117.9 kg)    BMI 39.53 kg/m   Physical Exam  Ortho Exam left shoulder incision healing without problem. Steri-Strips removed. Full flexion abduction internal rotation. No pain. Skin otherwise intact. Neurovascular exam intact  Specialty Comments:  No specialty comments available.  Imaging: No results found.   PMFS History: Patient Active Problem List   Diagnosis Date Noted  . Lipoma of left upper extremity 08/09/2017  . COPD mixed type ([employer]) 06/28/2017  . History of COPD 06/26/2016  . History of hypertension 06/26/2016  . Osteopenia of multiple sites 06/26/2016  . Morbid obesity ([place]) 05/04/2016  . High risk medication use 04/25/2016  . Vocal cord dysfunction 04/29/2015  . Seropositive rheumatoid arthritis of multiple sites ([place]) 04/29/2015  . HYPERLIPIDEMIA 10/06/2007  . Essential hypertension 10/06/2007  . OTHER DISEASES OF VOCAL CORDS 10/06/2007  . CERVICAL CANCER, HX OF 10/06/2007   Past Medical History:  Diagnosis Date  . Asthma   . Cervical cancer ([place])   . Hyperlipidemia   . Hypertension   . Rheumatoid arthritis(714.0)    [name]    Family History  Problem Relation Age of Onset  . Asthma Mother   . Breast cancer Mother     Past Surgical History:  Procedure Laterality Date  . ABDOMINAL HYSTERECTOMY    . TUBAL LIGATION    . TUMOR REMOVAL Right    FOREARM   Social History   Occupational History  . Occupation: retired  Tobacco Use  . Smoking status: Former Smoker    Packs/day: 1.00    Years: 30.00    Pack years: 30.00    Types: Cigarettes    Last attempt to quit: 06/22/1985    Years since quitting: 32.1  . Smokeless tobacco: Never Used  Substance and Sexual Activity  . Alcohol use: Yes    Comment: OCCASSIONAL GLASS OF WINE 1-2 MONTHLY  . Drug use: No  . Sexual activity: Not on file

## 2017-08-27 ENCOUNTER — Other Ambulatory Visit: Payer: Self-pay | Admitting: Rheumatology

## 2017-08-27 NOTE — Telephone Encounter (Signed)
Last Visit: 06/03/17 Next visit: 11/04/17 Labs: 08/02/17 Glucose mildly elevated. All other labs are WNL.  Okay to refill per Dr. [name]

## 2017-09-07 NOTE — Progress Notes (Signed)
Subjective:    Patient ID: Tina Hansen, female    DOB: [birth year], 77 y.o.   MRN: [id number]  HPI:  Tina Hansen presents to establish as a new pt.  She is a pleasant 77 year old female. PMH: COPD, Emphysema, HTN, Osteopenia, RA dx'd at age 73.  She quit tobacco use 1987 with 30 pack yr hx. She is followed by Pulmonology annually and Rheumatology Q6 months She reports taking her anti-hypertensive as needed, when her home SBP is > 150 She denies CP/palpiations. She recently lost her dog "Bella" and it has triggered stress eating of CHO She estimates to drink 20 oz water/day and eats a well balanced diet. She recently started using her home "Health Rider" (sounds like a rower)    Patient Care Team    Relationship Specialty Notifications Start End  Danford, Berna Spare, NP PCP - General Family Medicine  09/08/17     Patient Active Problem List   Diagnosis Date Noted  . Healthcare maintenance 09/08/2017  . Lipoma of left upper extremity 08/09/2017  . COPD mixed type ([place]) 06/28/2017  . History of COPD 06/26/2016  . History of hypertension 06/26/2016  . Osteopenia of multiple sites 06/26/2016  . Morbid obesity ([place]) 05/04/2016  . High risk medication use 04/25/2016  . Vocal cord dysfunction 04/29/2015  . Seropositive rheumatoid arthritis of multiple sites ([place]) 04/29/2015  . HYPERLIPIDEMIA 10/06/2007  . Essential hypertension 10/06/2007  . OTHER DISEASES OF VOCAL CORDS 10/06/2007  . CERVICAL CANCER, HX OF 10/06/2007     Past Medical History:  Diagnosis Date  . Asthma   . Cervical cancer ([place])   . Hyperlipidemia   . Hypertension   . Rheumatoid arthritis(714.0)    [name]     Past Surgical History:  Procedure Laterality Date  . ABDOMINAL HYSTERECTOMY    . BREAST SURGERY    . Breat implants    . TUBAL LIGATION    . TUMOR REMOVAL Right    FOREARM     Family History  Problem Relation Age of Onset  . Asthma Mother   . Breast cancer Mother   . Cancer Mother    . Alcohol abuse Father   . Diabetes Son   . Cancer Maternal Uncle   . Heart attack Maternal Uncle      Social History   Substance and Sexual Activity  Drug Use No     Social History   Substance and Sexual Activity  Alcohol Use Yes   Comment: OCCASSIONAL GLASS OF WINE 1-2 MONTHLY     Social History   Tobacco Use  Smoking Status Former Smoker  . Packs/day: 1.00  . Years: 30.00  . Pack years: 30.00  . Types: Cigarettes  . Last attempt to quit: 06/22/1985  . Years since quitting: 32.2  Smokeless Tobacco Never Used     Outpatient Encounter Medications as of 09/08/2017  Medication Sig  . albuterol (PROVENTIL) (2.5 MG/3ML) 0.083% nebulizer solution USE ONE VIAL IN NEBULIZER EVERY 6 HOURS AS NEEDED  . albuterol (VENTOLIN HFA) 108 (90 Base) MCG/ACT inhaler Inhale 1-2 puffs into the lungs every 6 (six) hours as needed for wheezing or shortness of breath.  . Biotin 5000 MCG CAPS Take 10,000 mcg by mouth daily.  . budesonide-formoterol (SYMBICORT) 160-4.5 MCG/ACT inhaler INHALE TWO PUFFS BY  MOUTH ONCE TO TWICE DAILY  . calcium gluconate 500 MG tablet Take 500 mg by mouth daily.    . Cholecalciferol (VITAMIN D3) 1000 units CAPS Take 1 capsule  by mouth daily.  . Chromium 1 MG CAPS Take 1 mg by mouth daily. Increase to 2 caps if needed  . Coenzyme Q10 (COQ-10) 100 MG CAPS Take 1 capsule by mouth daily.    . fish oil-omega-3 fatty acids 1000 MG capsule Take 2 capsules by mouth daily.    . folic acid (FOLVITE) 1 MG tablet TAKE ONE TABLET BY MOUTH ONCE DAILY  . IODINE, KELP, PO Put 5 drops in a liquid and drink once daily.  . Methotrexate Sodium (METHOTREXATE, PF,) 50 MG/2ML injection INJECT 0.3 ML (CC) ONCE A WEEK  . Milk Thistle 200 MG CAPS Take 400 mg by mouth daily.    . Multiple Vitamin (MULTIVITAMIN) capsule Take 1 capsule by mouth daily.    . NON FORMULARY Take 1 capsule by mouth daily. Collagen capsule  . Tuberculin-Allergy Syringes 27G X 1/2" 1 ML KIT Inject 1 Syringe into  the skin once a week. If syringe size greater than 72m, please call me at my office.  . valsartan-hydrochlorothiazide (DIOVAN-HCT) 80-12.5 MG per tablet Take 1 tablet by mouth daily.    . vitamin E (VITAMIN E) 400 UNIT capsule Take 400 Units by mouth daily.   No facility-administered encounter medications on file as of 09/08/2017.     Allergies: Other and Theophylline  Body mass index is 40 kg/m.  Blood pressure (!) 192/76, pulse 81, height 5' 7.99" (1.727 m), weight 263 lb (119.3 kg), SpO2 99 %.   Review of Systems  Constitutional: Positive for fatigue. Negative for activity change, appetite change, chills, diaphoresis, fever and unexpected weight change.  Eyes: Negative for visual disturbance.  Respiratory: Negative for cough, chest tightness, shortness of breath, wheezing and stridor.   Cardiovascular: Negative for chest pain, palpitations and leg swelling.  Genitourinary: Negative for difficulty urinating and flank pain.  Musculoskeletal: Positive for arthralgias and myalgias.  Neurological: Negative for dizziness and headaches.  Hematological: Does not bruise/bleed easily.  Psychiatric/Behavioral: Negative for decreased concentration, dysphoric mood, hallucinations, self-injury, sleep disturbance and suicidal ideas. The patient is not nervous/anxious and is not hyperactive.        Objective:   Physical Exam  Constitutional: She is oriented to person, place, and time. She appears well-developed and well-nourished. No distress.  HENT:  Head: Normocephalic and atraumatic.  Right Ear: External ear normal.  Left Ear: External ear normal.  Eyes: Conjunctivae are normal. Pupils are equal, round, and reactive to light.  Cardiovascular: Normal rate, regular rhythm, normal heart sounds and intact distal pulses.  No murmur heard. Pulmonary/Chest: Effort normal and breath sounds normal. No respiratory distress. She has no wheezes. She has no rales. She exhibits no tenderness.   Neurological: She is alert and oriented to person, place, and time.  Skin: Skin is warm and dry. No rash noted. She is not diaphoretic. No erythema. No pallor.  Psychiatric: She has a normal mood and affect. Her behavior is normal. Judgment and thought content normal.  Nursing note and vitals reviewed.         Assessment & Plan:   1. High risk medication use   2. History of hypertension   3. Osteopenia of multiple sites   4. Healthcare maintenance   5. Essential hypertension   6. Morbid obesity ([place]   7. Seropositive rheumatoid arthritis of multiple sites ([facility]     Healthcare maintenance Increase water intake and reduce saturated fat/carbohydrates. Recommend taking blood pressure medication once daily, instead of as needed. Continue regular follow-up with Pulmonologist and Rheumatologist as  directed. Recommend complete physical with fasting labs in 3 months. Sorry for the loss of your dog.  Essential hypertension BP well above goal 192/76, HR 81 Re-check 194/80 She has been taking valsartan/HCT 80/12.57m PRN when her home SBP >150 Last dose was a few days ago Discussed at length the importance of taking med DAILY She verbalized understanding/agreement RF sent in  Morbid obesity ([place] Body mass index is 40 kg/m.   She reports recent increase in CHO intake since her dog passed away.  Encouraged to follow heart healthy diet and increase regular movement- "Health Rider" equipment that she has at  home  Seropositive rheumatoid arthritis of multiple sites ([facility] Followed by Rheumatology Currently on Methorexate 0.3ML once weekly     FOLLOW-UP:  Return in about 3 months (around 12/09/2017) for CPE, Fasting Labs.

## 2017-09-08 ENCOUNTER — Other Ambulatory Visit: Payer: Self-pay | Admitting: Adult Health

## 2017-09-08 ENCOUNTER — Telehealth: Payer: Self-pay

## 2017-09-08 ENCOUNTER — Ambulatory Visit (INDEPENDENT_AMBULATORY_CARE_PROVIDER_SITE_OTHER): Payer: Medicare HMO | Admitting: Adult Health

## 2017-09-08 ENCOUNTER — Encounter: Payer: Self-pay | Admitting: Adult Health

## 2017-09-08 VITALS — BP 195/83 | HR 80 | Ht 67.99 in | Wt 263.0 lb

## 2017-09-08 DIAGNOSIS — Z8679 Personal history of other diseases of the circulatory system: Secondary | ICD-10-CM | POA: Diagnosis not present

## 2017-09-08 DIAGNOSIS — Z0000 Encounter for general adult medical examination without abnormal findings: Secondary | ICD-10-CM | POA: Insufficient documentation

## 2017-09-08 DIAGNOSIS — M8589 Other specified disorders of bone density and structure, multiple sites: Secondary | ICD-10-CM

## 2017-09-08 DIAGNOSIS — Z79899 Other long term (current) drug therapy: Secondary | ICD-10-CM

## 2017-09-08 DIAGNOSIS — M0579 Rheumatoid arthritis with rheumatoid factor of multiple sites without organ or systems involvement: Secondary | ICD-10-CM | POA: Diagnosis not present

## 2017-09-08 DIAGNOSIS — I10 Essential (primary) hypertension: Secondary | ICD-10-CM | POA: Diagnosis not present

## 2017-09-08 MED ORDER — LOSARTAN POTASSIUM-HCTZ 50-12.5 MG PO TABS
1.0000 | ORAL_TABLET | Freq: Every day | ORAL | 3 refills | Status: DC
Start: 2017-09-08 — End: 2018-01-05

## 2017-09-08 MED ORDER — VALSARTAN-HYDROCHLOROTHIAZIDE 80-12.5 MG PO TABS: 1.0000 | ORAL_TABLET | Freq: Every day | ORAL | 2 refills | Status: DC

## 2017-09-08 NOTE — Telephone Encounter (Signed)
Good Afternoon Whitney, Can you please call Ms. Jurado and share I d/c'd Valsartan/HCT (Diovan) since it is on back order I sent in Losartan/HCT (Cozaar)- it is same class and dose is equivalent to Diovan. Thanks! Valetta Fuller

## 2017-09-08 NOTE — Assessment & Plan Note (Signed)
Increase water intake and reduce saturated fat/carbohydrates. Recommend taking blood pressure medication once daily, instead of as needed. Continue regular follow-up with Pulmonologist and Rheumatologist as directed. Recommend complete physical with fasting labs in 3 months. Sorry for the loss of your dog.

## 2017-09-08 NOTE — Patient Instructions (Signed)
Heart-Healthy Eating Plan Heart-healthy meal planning includes:  Limiting unhealthy fats.  Increasing healthy fats.  Making other small dietary changes.  You may need to talk with your doctor or a diet specialist (dietitian) to create an eating plan that is right for you. What types of fat should I choose?  Choose healthy fats. These include olive oil and canola oil, flaxseeds, walnuts, almonds, and seeds.  Eat more omega-3 fats. These include salmon, mackerel, sardines, tuna, flaxseed oil, and ground flaxseeds. Try to eat fish at least twice each week.  Limit saturated fats. ? Saturated fats are often found in animal products, such as meats, butter, and cream. ? Plant sources of saturated fats include palm oil, palm kernel oil, and coconut oil.  Avoid foods with partially hydrogenated oils in them. These include stick margarine, some tub margarines, cookies, crackers, and other baked goods. These contain trans fats. What general guidelines do I need to follow?  Check food labels carefully. Identify foods with trans fats or high amounts of saturated fat.  Fill one half of your plate with vegetables and green salads. Eat 4-5 servings of vegetables per day. A serving of vegetables is: ? 1 cup of raw leafy vegetables. ?  cup of raw or cooked cut-up vegetables. ?  cup of vegetable juice.  Fill one fourth of your plate with whole grains. Look for the word "whole" as the first word in the ingredient list.  Fill one fourth of your plate with lean protein foods.  Eat 4-5 servings of fruit per day. A serving of fruit is: ? One medium whole fruit. ?  cup of dried fruit. ?  cup of fresh, frozen, or canned fruit. ?  cup of 100% fruit juice.  Eat more foods that contain soluble fiber. These include apples, broccoli, carrots, beans, peas, and barley. Try to get 20-30 g of fiber per day.  Eat more home-cooked food. Eat less restaurant, buffet, and fast food.  Limit or avoid  alcohol.  Limit foods high in starch and sugar.  Avoid fried foods.  Avoid frying your food. Try baking, boiling, grilling, or broiling it instead. You can also reduce fat by: ? Removing the skin from poultry. ? Removing all visible fats from meats. ? Skimming the fat off of stews, soups, and gravies before serving them. ? Steaming vegetables in water or broth.  Lose weight if you are overweight.  Eat 4-5 servings of nuts, legumes, and seeds per week: ? One serving of dried beans or legumes equals  cup after being cooked. ? One serving of nuts equals 1 ounces. ? One serving of seeds equals  ounce or one tablespoon.  You may need to keep track of how much salt or sodium you eat. This is especially true if you have high blood pressure. Talk with your doctor or dietitian to get more information. What foods can I eat? Grains Breads, including French, white, pita, wheat, raisin, rye, oatmeal, and Italian. Tortillas that are neither fried nor made with lard or trans fat. Low-fat rolls, including hotdog and hamburger buns and English muffins. Biscuits. Muffins. Waffles. Pancakes. Light popcorn. Whole-grain cereals. Flatbread. Melba toast. Pretzels. Breadsticks. Rusks. Low-fat snacks. Low-fat crackers, including oyster, saltine, matzo, graham, animal, and rye. Rice and pasta, including brown rice and pastas that are made with whole wheat. Vegetables All vegetables. Fruits All fruits, but limit coconut. Meats and Other Protein Sources Lean, well-trimmed beef, veal, pork, and lamb. Chicken and turkey without skin. All fish and shellfish.   Wild duck, rabbit, pheasant, and venison. Egg whites or low-cholesterol egg substitutes. Dried beans, peas, lentils, and tofu. Seeds and most nuts. Dairy Low-fat or nonfat cheeses, including ricotta, string, and mozzarella. Skim or 1% milk that is liquid, powdered, or evaporated. Buttermilk that is made with low-fat milk. Nonfat or low-fat  yogurt. Beverages Mineral water. Diet carbonated beverages. Sweets and Desserts Sherbets and fruit ices. Honey, jam, marmalade, jelly, and syrups. Meringues and gelatins. Pure sugar candy, such as hard candy, jelly beans, gumdrops, mints, marshmallows, and small amounts of dark chocolate. [employer] Eat all sweets and desserts in moderation. Fats and Oils Nonhydrogenated (trans-free) margarines. Vegetable oils, including soybean, sesame, sunflower, olive, peanut, safflower, corn, canola, and cottonseed. Salad dressings or mayonnaise made with a vegetable oil. Limit added fats and oils that you use for cooking, baking, salads, and as spreads. Other Cocoa powder. Coffee and tea. All seasonings and condiments. The items listed above may not be a complete list of recommended foods or beverages. Contact your dietitian for more options. What foods are not recommended? Grains Breads that are made with saturated or trans fats, oils, or whole milk. Croissants. Butter rolls. Cheese breads. Sweet rolls. Donuts. Buttered popcorn. Chow mein noodles. High-fat crackers, such as cheese or butter crackers. Meats and Other Protein Sources Fatty meats, such as hotdogs, short ribs, sausage, spareribs, bacon, rib eye roast or steak, and mutton. High-fat deli meats, such as salami and bologna. Caviar. Domestic duck and goose. Organ meats, such as kidney, liver, sweetbreads, and heart. Dairy Cream, sour cream, cream cheese, and creamed cottage cheese. Whole-milk cheeses, including blue (bleu), Monterey Jack, [place], [place], American, [place], Swiss, cheddar, [place], and [place]. Whole or 2% milk that is liquid, evaporated, or condensed. Whole buttermilk. Cream sauce or high-fat cheese sauce. Yogurt that is made from whole milk. Beverages Regular sodas and juice drinks with added sugar. Sweets and Desserts Frosting. Pudding. Cookies. Cakes other than angel food cake. Candy that has milk chocolate or white  chocolate, hydrogenated fat, butter, coconut, or unknown ingredients. Buttered syrups. Full-fat ice cream or ice cream drinks. Fats and Oils Gravy that has suet, meat fat, or shortening. Cocoa butter, hydrogenated oils, palm oil, coconut oil, palm kernel oil. These can often be found in baked products, candy, fried foods, nondairy creamers, and whipped toppings. Solid fats and shortenings, including bacon fat, salt pork, lard, and butter. Nondairy cream substitutes, such as coffee creamers and sour cream substitutes. Salad dressings that are made of unknown oils, cheese, or sour cream. The items listed above may not be a complete list of foods and beverages to avoid. Contact your dietitian for more information. This information is not intended to replace advice given to you by your health care provider. Make sure you discuss any questions you have with your health care provider. Document Released: 12/08/2011 Document Revised: 11/14/2015 Document Reviewed: 11/30/2013 Elsevier Interactive Patient Education  2018 [employer]. Hypertension Hypertension, commonly called high blood pressure, is when the force of blood pumping through the arteries is too strong. The arteries are the blood vessels that carry blood from the heart throughout the body. Hypertension forces the heart to work harder to pump blood and may cause arteries to become narrow or stiff. Having untreated or uncontrolled hypertension can cause heart attacks, strokes, kidney disease, and other problems. A blood pressure reading consists of a higher number over a lower number. Ideally, your blood pressure should be below 120/80. The first ("top") number is called the systolic pressure. It  is a measure of the pressure in your arteries as your heart beats. The second ("bottom") number is called the diastolic pressure. It is a measure of the pressure in your arteries as the heart relaxes. What are the causes? The cause of this condition is not  known. What increases the risk? Some risk factors for high blood pressure are under your control. Others are not. Factors you can change  Smoking.  Having type 2 diabetes mellitus, high cholesterol, or both.  Not getting enough exercise or physical activity.  Being overweight.  Having too much fat, sugar, calories, or salt (sodium) in your diet.  Drinking too much alcohol. Factors that are difficult or impossible to change  Having chronic kidney disease.  Having a family history of high blood pressure.  Age. Risk increases with age.  Race. You may be at higher risk if you are African-American.  Gender. Men are at higher risk than women before age 69. After age 77, women are at higher risk than men.  Having obstructive sleep apnea.  Stress. What are the signs or symptoms? Extremely high blood pressure (hypertensive crisis) may cause:  Headache.  Anxiety.  Shortness of breath.  Nosebleed.  Nausea and vomiting.  Severe chest pain.  Jerky movements you cannot control (seizures).  How is this diagnosed? This condition is diagnosed by measuring your blood pressure while you are seated, with your arm resting on a surface. The cuff of the blood pressure monitor will be placed directly against the skin of your upper arm at the level of your heart. It should be measured at least twice using the same arm. Certain conditions can cause a difference in blood pressure between your right and left arms. Certain factors can cause blood pressure readings to be lower or higher than normal (elevated) for a short period of time:  When your blood pressure is higher when you are in a health care provider's office than when you are at home, this is called white coat hypertension. Most people with this condition do not need medicines.  When your blood pressure is higher at home than when you are in a health care provider's office, this is called masked hypertension. Most people with this  condition may need medicines to control blood pressure.  If you have a high blood pressure reading during one visit or you have normal blood pressure with other risk factors:  You may be asked to return on a different day to have your blood pressure checked again.  You may be asked to monitor your blood pressure at home for 1 week or longer.  If you are diagnosed with hypertension, you may have other blood or imaging tests to help your health care provider understand your overall risk for other conditions. How is this treated? This condition is treated by making healthy lifestyle changes, such as eating healthy foods, exercising more, and reducing your alcohol intake. Your health care provider may prescribe medicine if lifestyle changes are not enough to get your blood pressure under control, and if:  Your systolic blood pressure is above 130.  Your diastolic blood pressure is above 80.  Your personal target blood pressure may vary depending on your medical conditions, your age, and other factors. Follow these instructions at home: Eating and drinking  Eat a diet that is high in fiber and potassium, and low in sodium, added sugar, and fat. An example eating plan is called the DASH (Dietary Approaches to Stop Hypertension) diet. To eat this  way: ? Eat plenty of fresh fruits and vegetables. Try to fill half of your plate at each meal with fruits and vegetables. ? Eat whole grains, such as whole wheat pasta, brown rice, or whole grain bread. Fill about one quarter of your plate with whole grains. ? Eat or drink low-fat dairy products, such as skim milk or low-fat yogurt. ? Avoid fatty cuts of meat, processed or cured meats, and poultry with skin. Fill about one quarter of your plate with lean proteins, such as fish, chicken without skin, beans, eggs, and tofu. ? Avoid premade and processed foods. These tend to be higher in sodium, added sugar, and fat.  Reduce your daily sodium intake. Most  people with hypertension should eat less than 1,500 mg of sodium a day.  Limit alcohol intake to no more than 1 drink a day for nonpregnant women and 2 drinks a day for men. One drink equals 12 oz of beer, 5 oz of wine, or 1 oz of hard liquor. Lifestyle  Work with your health care provider to maintain a healthy body weight or to lose weight. Ask what an ideal weight is for you.  Get at least 30 minutes of exercise that causes your heart to beat faster (aerobic exercise) most days of the week. Activities may include walking, swimming, or biking.  Include exercise to strengthen your muscles (resistance exercise), such as pilates or lifting weights, as part of your weekly exercise routine. Try to do these types of exercises for 30 minutes at least 3 days a week.  Do not use any products that contain nicotine or tobacco, such as cigarettes and e-cigarettes. If you need help quitting, ask your health care provider.  Monitor your blood pressure at home as told by your health care provider.  Keep all follow-up visits as told by your health care provider. This is important. Medicines  Take over-the-counter and prescription medicines only as told by your health care provider. Follow directions carefully. Blood pressure medicines must be taken as prescribed.  Do not skip doses of blood pressure medicine. Doing this puts you at risk for problems and can make the medicine less effective.  Ask your health care provider about side effects or reactions to medicines that you should watch for. Contact a health care provider if:  You think you are having a reaction to a medicine you are taking.  You have headaches that keep coming back (recurring).  You feel dizzy.  You have swelling in your ankles.  You have trouble with your vision. Get help right away if:  You develop a severe headache or confusion.  You have unusual weakness or numbness.  You feel faint.  You have severe pain in your  chest or abdomen.  You vomit repeatedly.  You have trouble breathing. Summary  Hypertension is when the force of blood pumping through your arteries is too strong. If this condition is not controlled, it may put you at risk for serious complications.  Your personal target blood pressure may vary depending on your medical conditions, your age, and other factors. For most people, a normal blood pressure is less than 120/80.  Hypertension is treated with lifestyle changes, medicines, or a combination of both. Lifestyle changes include weight loss, eating a healthy, low-sodium diet, exercising more, and limiting alcohol. This information is not intended to replace advice given to you by your health care provider. Make sure you discuss any questions you have with your health care provider. Document Released: 06/08/2005  Document Revised: 05/06/2016 Document Reviewed: 05/06/2016 Elsevier Interactive Patient Education  2018 [employer].  Increase water intake and reduce saturated fat/carbohydrates. Recommend taking blood pressure medication once daily, instead of as needed. Continue regular follow-up with Pulmonologist and Rheumatologist as directed. Recommend complete physical with fasting labs in 3 months. Sorry for the loss of your dog. WELCOME TO THE PRACTICE!

## 2017-09-08 NOTE — Assessment & Plan Note (Signed)
Followed by Rheumatology Currently on Methorexate 0.3ML once weekly

## 2017-09-08 NOTE — Assessment & Plan Note (Signed)
Body mass index is 40 kg/m.   She reports recent increase in CHO intake since her dog passed away.  Encouraged to follow heart healthy diet and increase regular movement- "[employer]" equipment that she has at  home

## 2017-09-08 NOTE — Telephone Encounter (Signed)
Pt called stating that the medication prescribed to her today was on back order and pharmacist wasn't sure how long it would be on order. Please advise. Felecia Shelling, CMA

## 2017-09-08 NOTE — Telephone Encounter (Signed)
Pt was called and informed of the medication change. Felecia Shelling, CMA

## 2017-09-08 NOTE — Assessment & Plan Note (Signed)
BP well above goal 192/76, HR 81 Re-check 194/80 She has been taking valsartan/HCT 80/12.5mg  PRN when her home SBP >150 Last dose was a few days ago Discussed at length the importance of taking med DAILY She verbalized understanding/agreement RF sent in

## 2017-09-08 NOTE — Progress Notes (Signed)
Valsartan/HCT on back order- med d/c'd Sent in [employer]

## 2017-09-08 NOTE — Assessment & Plan Note (Signed)
>>  ASSESSMENT AND PLAN FOR ESSENTIAL HYPERTENSION WRITTEN ON 09/08/2017 12:00 PM BY DANFORD, KATY D, NP  BP well above goal 192/76, HR 81 Re-check 194/80 She has been taking valsartan/HCT 80/12.5mg  PRN when her home SBP >150 Last dose was a few days ago Discussed at length the importance of taking med DAILY She verbalized understanding/agreement RF sent in

## 2017-10-21 NOTE — Progress Notes (Addendum)
Office Visit Note  Patient: Tina Hansen             Date of Birth: 17-Sep-1940           MRN: [id number]             PCP: Esaw Grandchild, NP Referring: Tamsen Roers, MD Visit Date: 11/04/2017 Occupation: @GUAROCC @    Subjective:  Medication monitoring   History of Present Illness: Tina Hansen is a 77 y.o. female with history of seropositive rheumatoid arthritis.  Patient states she continues to inject methotrexate 0.3 mL subcutaneously once weekly and takes folic acid 1 mg daily.  She denies any recent rheumatoid arthritis flares.  She denies any joint pain or joint swelling at this time.  She denies any joint stiffness.  She states that she was recently swimming in a pool doing exercises and developed increased discomfort in her right shoulder but it is improving.  She states she is continued to have good range of motion in her bilateral shoulders.   Activities of Daily Living:  Patient reports morning stiffness for 2  minutes.   Patient Denies nocturnal pain.  Difficulty dressing/grooming: Denies Difficulty climbing stairs: Denies Difficulty getting out of chair: Denies Difficulty using hands for taps, buttons, cutlery, and/or writing: Denies   Review of Systems  Constitutional: Positive for fatigue.  HENT: Positive for mouth dryness. Negative for mouth sores and nose dryness.   Eyes: Negative for pain, visual disturbance and dryness.  Respiratory: Negative for cough, hemoptysis, shortness of breath and difficulty breathing.   Cardiovascular: Negative for chest pain, palpitations, hypertension, irregular heartbeat and swelling in legs/feet.  Gastrointestinal: Negative for blood in stool, constipation and diarrhea.  Endocrine: Negative for increased urination.  Genitourinary: Negative for painful urination.  Musculoskeletal: Positive for morning stiffness. Negative for arthralgias, joint pain, joint swelling, myalgias, muscle weakness, muscle tenderness and  myalgias.  Skin: Negative for color change, pallor, rash, hair loss, nodules/bumps, skin tightness, ulcers and sensitivity to sunlight.  Allergic/Immunologic: Negative for susceptible to infections.  Neurological: Negative for dizziness, numbness, headaches and weakness.  Hematological: Negative for swollen glands.  Psychiatric/Behavioral: Positive for sleep disturbance. Negative for depressed mood. The patient is not nervous/anxious.     PMFS History:  Patient Active Problem List   Diagnosis Date Noted  . Healthcare maintenance 09/08/2017  . Lipoma of left upper extremity 08/09/2017  . COPD mixed type ([place]) 06/28/2017  . History of COPD 06/26/2016  . History of hypertension 06/26/2016  . Osteopenia of multiple sites 06/26/2016  . Morbid obesity ([place]) 05/04/2016  . High risk medication use 04/25/2016  . Vocal cord dysfunction 04/29/2015  . Seropositive rheumatoid arthritis of multiple sites ([place]) 04/29/2015  . HYPERLIPIDEMIA 10/06/2007  . Essential hypertension 10/06/2007  . OTHER DISEASES OF VOCAL CORDS 10/06/2007  . CERVICAL CANCER, HX OF 10/06/2007    Past Medical History:  Diagnosis Date  . Asthma   . Cervical cancer ([place])   . Hyperlipidemia   . Hypertension   . Rheumatoid arthritis(714.0)    [name]    Family History  Problem Relation Age of Onset  . Asthma Mother   . Breast cancer Mother   . Cancer Mother   . Alcohol abuse Father   . Diabetes Son   . Cancer Maternal Uncle   . Heart attack Maternal Uncle    Past Surgical History:  Procedure Laterality Date  . ABDOMINAL HYSTERECTOMY    . BREAST SURGERY    . Breat implants    .  LIPOMA EXCISION Left    shoulder   . TUBAL LIGATION    . TUMOR REMOVAL Right    FOREARM   Social History   Social History Narrative  . Not on file     Objective: Vital Signs: BP 130/74 (BP Location: Left Wrist, Patient Position: Sitting, Cuff Size: Normal)   Pulse 80   Resp 16   Ht 5\' 7"  (1.702 m)   Wt 267 lb (121.1 kg)    BMI 41.82 kg/m    Physical Exam  Constitutional: She is oriented to person, place, and time. She appears well-developed and well-nourished.  HENT:  Head: Normocephalic and atraumatic.  Eyes: Conjunctivae and EOM are normal.  Neck: Normal range of motion.  Cardiovascular: Normal rate, regular rhythm, normal heart sounds and intact distal pulses.  Pulmonary/Chest: Effort normal and breath sounds normal.  Abdominal: Soft. Bowel sounds are normal.  Lymphadenopathy:    She has no cervical adenopathy.  Neurological: She is alert and oriented to person, place, and time.  Skin: Skin is warm and dry. Capillary refill takes less than 2 seconds.  Psychiatric: She has a normal mood and affect. Her behavior is normal.  Nursing note and vitals reviewed.    Musculoskeletal Exam: C-spine, thoracic spine, and lumbar spine good ROM.  No midline spinal tenderness.  No SI joint tenderness.  Shoulder joints, elbow joints, wrist joints, MCPs, PIPs, and DIPs good ROM with no synovitis.  PIP and DIP synovial thickening consistent with osteoarthritis.  Hip joints, knee joints, ankle joints, MTPs, PIPs, and DIPs good ROM with no synovitis.  No warmth or effusion of knee joints.  No tenderness of trochanteric bursa.   CDAI Exam: No CDAI exam completed.    Investigation: No additional findings. CBC Latest Ref Rng & Units 08/02/2017 05/11/2017 12/31/2016  WBC 3.8 - 10.8 Thousand/uL 8.0 5.8 6.0  Hemoglobin 11.7 - 15.5 g/dL 15.0 14.1 14.4  Hematocrit 35.0 - 45.0 % 44.6 41.4 44.1  Platelets 140 - 400 Thousand/uL 247 260 255   CMP Latest Ref Rng & Units 08/02/2017 05/11/2017 12/31/2016  Glucose 65 - 99 mg/dL 119(H) 100(H) 113(H)  BUN 7 - 25 mg/dL 21 20 20   Creatinine 0.60 - 0.93 mg/dL 0.93 0.84 1.00(H)  Sodium 135 - 146 mmol/L 140 144 139  Potassium 3.5 - 5.3 mmol/L 4.1 5.3 5.3  Chloride 98 - 110 mmol/L 106 108 102  CO2 20 - 32 mmol/L 24 30 24   Calcium 8.6 - 10.4 mg/dL 9.3 9.4 9.4  Total Protein 6.1 - 8.1  g/dL 7.0 6.9 6.8  Total Bilirubin 0.2 - 1.2 mg/dL 0.4 0.3 0.5  Alkaline Phos 33 - 130 U/L - - 63  AST 10 - 35 U/L 16 19 27   ALT 6 - 29 U/L 14 19 27    Imaging: No results found.  Speciality Comments: No specialty comments available.    Procedures:  No procedures performed Allergies: Other and Theophylline   Association of heart disease with rheumatoid arthritis was discussed. Need to monitor blood pressure, cholesterol, and to exercise 30-60 minutes on daily basis was discussed. Poor dental hygiene can be a predisposing factor for rheumatoid arthritis. Good dental hygiene was discussed.   Assessment / Plan:     Visit Diagnoses: Seropositive rheumatoid arthritis of multiple sites ([employer]) - +RF, +CCP, h/o elevated sed rate.:  She has no synovitis on exam today.  She is on any recent rheumatoid arthritis flares.  She has no joint pain or joint swelling at this time.  She is clinically doing well on methotrexate 0.3 mL subcutaneously once a week and taking folic acid 1 mg daily.  She does not need any refills at this time.  High risk medication use - MTX 0.3 mL subcutaneous every week, folic acid 1 mg by mouth daily -CBC and CMP are done today to monitor for drug toxicity.  She will return in August and every 3 months for lab work.- Plan: CBC with Differential/Platelet, COMPLETE METABOLIC PANEL WITH GFR  Osteopenia of multiple sites: She takes vitamin D 1000 units daily.  CERVICAL CANCER, HX OF  History of COPD - She's been taking albuterol and followed up by Dr. [name].  History of hypertension  History of hyperlipidemia  Encounter for health-related screening -She requested lab work to be for performed before her PCP appointment coming up in June.  She is fasting today.  Plan: Lipid panel, TSH, VITAMIN D 25 Hydroxy (Vit-D Deficiency, Fractures), HgB A1c    Orders: Orders Placed This Encounter  Procedures  . CBC with Differential/Platelet  . COMPLETE METABOLIC PANEL WITH GFR  .  Lipid panel  . TSH  . VITAMIN D 25 Hydroxy (Vit-D Deficiency, Fractures)  . HgB A1c   No orders of the defined types were placed in this encounter.     Follow-Up Instructions: Return in about 5 months (around 04/06/2018) for Rheumatoid arthritis.   Ofilia Neas, PA-C   Association of heart disease with rheumatoid arthritis was discussed. Need to monitor blood pressure, cholesterol, and to exercise 30-60 minutes on daily basis was discussed. Poor dental hygiene can be a predisposing factor for rheumatoid arthritis. Good dental hygiene was discussed.  I examined and evaluated the patient with Hazel Sams PA. Patient had no synovitis on my exam today.The plan of care was discussed as noted above.  Bo Merino, MD  Note - This record has been created using Editor, commissioning.  Chart creation errors have been sought, but may not always  have been located. Such creation errors do not reflect on  the standard of medical care.

## 2017-11-04 ENCOUNTER — Encounter: Payer: Self-pay | Admitting: Rheumatology

## 2017-11-04 ENCOUNTER — Ambulatory Visit: Payer: Medicare HMO | Admitting: Rheumatology

## 2017-11-04 VITALS — BP 130/74 | HR 80 | Resp 16 | Ht 67.0 in | Wt 267.0 lb

## 2017-11-04 DIAGNOSIS — Z8679 Personal history of other diseases of the circulatory system: Secondary | ICD-10-CM | POA: Diagnosis not present

## 2017-11-04 DIAGNOSIS — Z139 Encounter for screening, unspecified: Secondary | ICD-10-CM

## 2017-11-04 DIAGNOSIS — M0579 Rheumatoid arthritis with rheumatoid factor of multiple sites without organ or systems involvement: Secondary | ICD-10-CM

## 2017-11-04 DIAGNOSIS — Z8639 Personal history of other endocrine, nutritional and metabolic disease: Secondary | ICD-10-CM | POA: Diagnosis not present

## 2017-11-04 DIAGNOSIS — M8589 Other specified disorders of bone density and structure, multiple sites: Secondary | ICD-10-CM

## 2017-11-04 DIAGNOSIS — Z8709 Personal history of other diseases of the respiratory system: Secondary | ICD-10-CM | POA: Diagnosis not present

## 2017-11-04 DIAGNOSIS — Z8541 Personal history of malignant neoplasm of cervix uteri: Secondary | ICD-10-CM | POA: Diagnosis not present

## 2017-11-04 DIAGNOSIS — Z79899 Other long term (current) drug therapy: Secondary | ICD-10-CM

## 2017-11-04 NOTE — Patient Instructions (Signed)
Standing Labs We placed an order today for your standing lab work.    Please come back and get your standing labs in August and every 3 months   We have open lab Monday through Friday from 8:30-11:30 AM and 1:30-4:00 PM  at the office of Dr. Shaili [name].   You may experience shorter wait times on Monday and Friday afternoons. The office is located at [address], [address], [place], [place] [address] No appointment is necessary.   Labs are drawn by [facility].  You may receive a bill from [facility] for your lab work. If you have any questions regarding directions or hours of operation,  please call [phone].    

## 2017-11-05 LAB — COMPLETE METABOLIC PANEL WITH GFR
AG Ratio: 1.6 (calc) (ref 1.0–2.5)
ALBUMIN MSPROF: 4.2 g/dL (ref 3.6–5.1)
ALKALINE PHOSPHATASE (APISO): 56 U/L (ref 33–130)
ALT: 16 U/L (ref 6–29)
AST: 20 U/L (ref 10–35)
BILIRUBIN TOTAL: 0.6 mg/dL (ref 0.2–1.2)
BUN / CREAT RATIO: 21 (calc) (ref 6–22)
BUN: 22 mg/dL (ref 7–25)
CHLORIDE: 103 mmol/L (ref 98–110)
CO2: 29 mmol/L (ref 20–32)
Calcium: 9.7 mg/dL (ref 8.6–10.4)
Creat: 1.04 mg/dL — ABNORMAL HIGH (ref 0.60–0.93)
GFR, Est African American: 60 mL/min/{1.73_m2} (ref >=60)
GFR, Est Non African American: 52 mL/min/{1.73_m2} — ABNORMAL LOW (ref >=60)
GLUCOSE: 109 mg/dL — AB (ref 65–99)
Globulin: 2.7 g/dL (calc) (ref 1.9–3.7)
Potassium: 4.4 mmol/L (ref 3.5–5.3)
Sodium: 140 mmol/L (ref 135–146)
Total Protein: 6.9 g/dL (ref 6.1–8.1)

## 2017-11-05 LAB — HEMOGLOBIN A1C
EAG (MMOL/L): 6.8 (calc)
Hgb A1c MFr Bld: 5.9 % of total Hgb — ABNORMAL HIGH (ref <5.7)
Mean Plasma Glucose: 123 (calc)

## 2017-11-05 LAB — LIPID PANEL
Cholesterol: 228 mg/dL — ABNORMAL HIGH (ref <200)
HDL: 75 mg/dL (ref >50)
LDL CHOLESTEROL (CALC): 132 mg/dL — AB
NON-HDL CHOLESTEROL (CALC): 153 mg/dL — AB (ref <130)
Total CHOL/HDL Ratio: 3 (calc) (ref <5.0)
Triglycerides: 105 mg/dL (ref <150)

## 2017-11-05 LAB — CBC WITH DIFFERENTIAL/PLATELET
Basophils Absolute: 39 cells/uL (ref 0–200)
Basophils Relative: 0.6 %
EOS ABS: 98 {cells}/uL (ref 15–500)
Eosinophils Relative: 1.5 %
HCT: 42.2 % (ref 35.0–45.0)
Hemoglobin: 14.3 g/dL (ref 11.7–15.5)
Lymphs Abs: 1138 cells/uL (ref 850–3900)
MCH: 29.5 pg (ref 27.0–33.0)
MCHC: 33.9 g/dL (ref 32.0–36.0)
MCV: 87.2 fL (ref 80.0–100.0)
MONOS PCT: 8.3 %
MPV: 10.3 fL (ref 7.5–12.5)
NEUTROS PCT: 72.1 %
Neutro Abs: 4687 cells/uL (ref 1500–7800)
Platelets: 254 10*3/uL (ref 140–400)
RBC: 4.84 10*6/uL (ref 3.80–5.10)
RDW: 14 % (ref 11.0–15.0)
TOTAL LYMPHOCYTE: 17.5 %
WBC mixed population: 540 cells/uL (ref 200–950)
WBC: 6.5 10*3/uL (ref 3.8–10.8)

## 2017-11-05 LAB — VITAMIN D 25 HYDROXY (VIT D DEFICIENCY, FRACTURES): Vit D, 25-Hydroxy: 54 ng/mL (ref 30–100)

## 2017-11-05 LAB — TSH: TSH: 3.37 m[IU]/L (ref 0.40–4.50)

## 2017-11-05 NOTE — Progress Notes (Signed)
CBC WNL. Glucose 109. Creatinine elevated and GFR mildly low.  Please advise patient to avoid NSAIDs.   HgbA1c 5.9. Cholesterol and LDL elevated.  Please forward results to PCP.

## 2017-11-29 ENCOUNTER — Other Ambulatory Visit: Payer: Self-pay | Admitting: Rheumatology

## 2017-11-29 NOTE — Telephone Encounter (Signed)
Last visit: 11/04/17 Next Visit: 04/07/18  Okay to refill per Dr. [name]

## 2017-12-30 ENCOUNTER — Encounter: Payer: Medicare HMO | Admitting: Adult Health

## 2018-01-05 ENCOUNTER — Encounter: Payer: Self-pay | Admitting: Adult Health

## 2018-01-05 ENCOUNTER — Other Ambulatory Visit: Payer: Self-pay | Admitting: Rheumatology

## 2018-01-05 ENCOUNTER — Ambulatory Visit (INDEPENDENT_AMBULATORY_CARE_PROVIDER_SITE_OTHER): Payer: Medicare HMO | Admitting: Adult Health

## 2018-01-05 VITALS — BP 171/79 | HR 82 | Ht 67.0 in | Wt 267.9 lb

## 2018-01-05 DIAGNOSIS — Z0000 Encounter for general adult medical examination without abnormal findings: Secondary | ICD-10-CM | POA: Diagnosis not present

## 2018-01-05 DIAGNOSIS — R944 Abnormal results of kidney function studies: Secondary | ICD-10-CM | POA: Diagnosis not present

## 2018-01-05 DIAGNOSIS — I10 Essential (primary) hypertension: Secondary | ICD-10-CM | POA: Diagnosis not present

## 2018-01-05 DIAGNOSIS — E782 Mixed hyperlipidemia: Secondary | ICD-10-CM | POA: Diagnosis not present

## 2018-01-05 MED ORDER — DOXYCYCLINE HYCLATE 100 MG PO TABS: 100.0000 mg | ORAL_TABLET | Freq: Two times a day (BID) | ORAL | 0 refills | Status: DC

## 2018-01-05 MED ORDER — LOSARTAN POTASSIUM-HCTZ 50-12.5 MG PO TABS: 1.0000 | ORAL_TABLET | Freq: Every day | ORAL | 3 refills | Status: DC

## 2018-01-05 MED ORDER — HYDROCHLOROTHIAZIDE 12.5 MG PO CAPS: 12.5000 mg | ORAL_CAPSULE | Freq: Every day | ORAL | 1 refills | Status: DC

## 2018-01-05 MED ORDER — LOSARTAN POTASSIUM 25 MG PO TABS: ORAL_TABLET | ORAL | 1 refills | Status: DC

## 2018-01-05 NOTE — Assessment & Plan Note (Signed)
Re-start your blood pressure medication. If you ever have a problem filling a rx, please call clinic. Please take Doxycycline as directed to treat sinusitis and bronchitis. Reduce saturated fat and resume regular exercise once you feel better. Continue to drink water and follow Mediterranean diet. Please continue all other medications as directed. Follow-up in 3 months.

## 2018-01-05 NOTE — Assessment & Plan Note (Addendum)
Both BP checks above goal Pt has been out of Losartan/HCTZ 50/12.5mg  for 4 days, pharmacy back order  Sent Losartan 50mg  QD and HCTZ 12.5mg  QD separately to resume current dosage regime Pt informed of change due

## 2018-01-05 NOTE — Progress Notes (Signed)
Subjective:    Patient ID: Tina Hansen, female    DOB: Sep 19, 1940, 77 y.o.   MRN: [id number]  HPI: 09/08/17 OV:  [name] presents to establish as a new pt.  She is a pleasant 77 year old female. PMH: COPD, Emphysema, HTN, Osteopenia, RA dx'Hansen at age 12.  She quit tobacco use 1987 with 30 pack yr hx. She is followed by Pulmonology annually and Rheumatology Q6 months She reports taking her anti-hypertensive as needed, when her home SBP is > 150 She denies CP/palpiations. She recently lost her dog "Bella" and it has triggered stress eating of CHO She estimates to drink 20 oz water/day and eats a well balanced diet. She recently started using her home "[employer]" (sounds like a rower)   01/05/18 OV: [name] presents for CPE She was seen by Rheumatologist 11/04/17- recommended to avoid NSAIDs, continue on Methotrexate 0.39mL subcutaneously once weekly and folic acid 1mg  QD Follow-up with labs in August with Rheumatologist. She has had productive cough the last 2 weeks- thick/white mucus with copious nasal drainage- thick/white  She denies fever/night sweats She reports fatigue and has not been exercising since being "under the weather" She has been increasing water and using breathing tx's as directed. She reports being out of BP meds the last 4 days "b/c the pharmacy ran out of medication"  The 10-year ASCVD risk score Tina Hansen., et al., 2013) is: 42.3%   Values used to calculate the score:     Age: 47 years     Sex: Female     Is Non-Hispanic African American: No     Diabetic: No     Tobacco smoker: No     Systolic Blood Pressure: 573 mmHg     Is BP treated: Yes     HDL Cholesterol: 75 mg/dL     Total Cholesterol: 228 mg/dL  LDL- 132  Patient Care Team    Relationship Specialty Notifications Start End  Tina Marble D, NP PCP - General Family Medicine  09/08/17   Tina Space, MD Consulting Physician Pulmonary Disease  09/08/17   Tina Merino, MD  Consulting Physician Rheumatology  09/08/17     Patient Active Problem List   Diagnosis Date Noted  . Decreased GFR 01/05/2018  . Healthcare maintenance 09/08/2017  . Lipoma of left upper extremity 08/09/2017  . COPD mixed type ([place]) 06/28/2017  . History of COPD 06/26/2016  . History of hypertension 06/26/2016  . Osteopenia of multiple sites 06/26/2016  . Morbid obesity ([place]) 05/04/2016  . High risk medication use 04/25/2016  . Vocal cord dysfunction 04/29/2015  . Seropositive rheumatoid arthritis of multiple sites ([place]) 04/29/2015  . Hyperlipidemia 10/06/2007  . Essential hypertension 10/06/2007  . OTHER DISEASES OF VOCAL CORDS 10/06/2007  . CERVICAL CANCER, HX OF 10/06/2007     Past Medical History:  Diagnosis Date  . Asthma   . Cervical cancer ([place])   . Hyperlipidemia   . Hypertension   . Rheumatoid arthritis(714.0)    [name]     Past Surgical History:  Procedure Laterality Date  . ABDOMINAL HYSTERECTOMY    . BREAST SURGERY    . Breat implants    . LIPOMA EXCISION Left    shoulder   . TUBAL LIGATION    . TUMOR REMOVAL Right    FOREARM     Family History  Problem Relation Age of Onset  . Asthma Mother   . Breast cancer Mother   . Cancer Mother   .  Alcohol abuse Father   . Diabetes Son   . Cancer Maternal Uncle   . Heart attack Maternal Uncle      Social History   Substance and Sexual Activity  Drug Use No     Social History   Substance and Sexual Activity  Alcohol Use Yes   Comment: OCCASSIONAL GLASS OF WINE 1-2 MONTHLY     Social History   Tobacco Use  Smoking Status Former Smoker  . Packs/day: 1.00  . Years: 30.00  . Pack years: 30.00  . Types: Cigarettes  . Last attempt to quit: 06/22/1985  . Years since quitting: 32.5  Smokeless Tobacco Never Used     Outpatient Encounter Medications as of 01/05/2018  Medication Sig  . albuterol (PROVENTIL) (2.5 MG/3ML) 0.083% nebulizer solution USE ONE VIAL IN NEBULIZER EVERY 6 HOURS AS  NEEDED  . albuterol (VENTOLIN HFA) 108 (90 Base) MCG/ACT inhaler Inhale 1-2 puffs into the lungs every 6 (six) hours as needed for wheezing or shortness of breath.  . Biotin 5000 MCG CAPS Take 10,000 mcg by mouth daily.  . budesonide-formoterol (SYMBICORT) 160-4.5 MCG/ACT inhaler INHALE TWO PUFFS BY  MOUTH ONCE TO TWICE DAILY  . calcium gluconate 500 MG tablet Take 500 mg by mouth daily.    . Cholecalciferol (VITAMIN D3) 1000 units CAPS Take 1 capsule by mouth daily.   . Chromium 1 MG CAPS Take 1 mg by mouth daily. Increase to 2 caps if needed  . Coenzyme Q10 (COQ-10) 100 MG CAPS Take 1 capsule by mouth daily.    . fish oil-omega-3 fatty acids 1000 MG capsule Take 2 capsules by mouth daily.    . folic acid (FOLVITE) 1 MG tablet TAKE 1 TABLET BY MOUTH ONCE DAILY  . IODINE, KELP, PO Put 5 drops in a liquid and drink once daily.  . LUTEIN PO Take by mouth daily.  . Methotrexate Sodium (METHOTREXATE, PF,) 50 MG/2ML injection INJECT 0.3 ML (CC) ONCE A WEEK  . Methylsulfonylmethane (MSM PO) Take by mouth daily.  . Milk Thistle 200 MG CAPS Take 400 mg by mouth daily.    . Multiple Vitamin (MULTIVITAMIN) capsule Take 1 capsule by mouth daily.    . NON FORMULARY Take 1 capsule by mouth daily. Collagen capsule  . vitamin E (VITAMIN E) 400 UNIT capsule Take 400 Units by mouth daily.  . [DISCONTINUED] losartan-hydrochlorothiazide (HYZAAR) 50-12.5 MG tablet Take 1 tablet by mouth daily.  . [DISCONTINUED] losartan-hydrochlorothiazide (HYZAAR) 50-12.5 MG tablet Take 1 tablet by mouth daily.  Marland Kitchen doxycycline (VIBRA-TABS) 100 MG tablet Take 1 tablet (100 mg total) by mouth 2 (two) times daily.  . hydrochlorothiazide (MICROZIDE) 12.5 MG capsule Take 1 capsule (12.5 mg total) by mouth daily.  Marland Kitchen losartan (COZAAR) 25 MG tablet 2 tabs daily to equal 50mg  once daily dose   No facility-administered encounter medications on file as of 01/05/2018.     Allergies: Other and Theophylline  Body mass index is 41.96  kg/m.  Blood pressure (!) 171/79, pulse 82, height 5\' 7"  (1.702 m), weight 267 lb 14.4 oz (121.5 kg), SpO2 94 %.   Review of Systems  Constitutional: Positive for fatigue. Negative for activity change, appetite change, chills, diaphoresis, fever and unexpected weight change.  HENT: Positive for congestion, postnasal drip and sinus pressure.   Eyes: Negative for visual disturbance.  Respiratory: Positive for cough and chest tightness. Negative for shortness of breath, wheezing and stridor.   Cardiovascular: Negative for chest pain, palpitations and leg swelling.  Gastrointestinal:  Negative for abdominal distention, abdominal pain, blood in stool, constipation, diarrhea, nausea and vomiting.  Endocrine: Negative for cold intolerance, heat intolerance, polydipsia, polyphagia and polyuria.  Genitourinary: Negative for difficulty urinating and flank pain.  Musculoskeletal: Positive for arthralgias and myalgias. Negative for back pain, gait problem, joint swelling, neck pain and neck stiffness.  Skin: Negative for color change, pallor, rash and wound.  Neurological: Negative for dizziness and headaches.  Hematological: Does not bruise/bleed easily.  Psychiatric/Behavioral: Negative for decreased concentration, dysphoric mood, hallucinations, self-injury, sleep disturbance and suicidal ideas. The patient is not nervous/anxious and is not hyperactive.        Objective:   Physical Exam  Constitutional: She is oriented to person, place, and time. She appears well-developed and well-nourished. No distress.  HENT:  Head: Normocephalic and atraumatic.  Right Ear: External ear normal.  Left Ear: External ear normal.  Eyes: Pupils are equal, round, and reactive to light. Conjunctivae are normal.  Neck: Normal range of motion. Neck supple.  Cardiovascular: Normal rate, regular rhythm, normal heart sounds and intact distal pulses.  No murmur heard. Pulmonary/Chest: Effort normal. No respiratory  distress. She has no decreased breath sounds. She has wheezes in the right middle field, the right lower field, the left middle field and the left lower field. She has no rhonchi. She has no rales. She exhibits no tenderness.  Abdominal: Soft. Bowel sounds are normal. She exhibits no distension and no mass. There is no tenderness. There is no rebound and no guarding.  Musculoskeletal: Normal range of motion.  Lymphadenopathy:    She has no cervical adenopathy.  Neurological: She is alert and oriented to person, place, and time. Coordination normal.  Skin: Skin is warm and dry. No rash noted. She is not diaphoretic. No erythema. No pallor.  Psychiatric: She has a normal mood and affect. Her behavior is normal. Judgment and thought content normal.  Nursing note and vitals reviewed.     Assessment & Plan:   1. Essential hypertension   2. Morbid obesity ([place])   3. Healthcare maintenance   4. Mixed hyperlipidemia   5. Decreased GFR     Morbid obesity ([facility]) Body mass index is 41.96 kg/m.  Current wt 267 Discussed at length the importance of regular exercise and following a Mediterranean diet.  Healthcare maintenance Re-start your blood pressure medication. If you ever have a problem filling a rx, please call clinic. Please take Doxycycline as directed to treat sinusitis and bronchitis. Reduce saturated fat and resume regular exercise once you feel better. Continue to drink water and follow Mediterranean diet. Please continue all other medications as directed. Follow-up in 3 months.  Hyperlipidemia The 10-year ASCVD risk score Tina Bussing DC Jr., et al., 2013) is: 44.2%   Values used to calculate the score:     Age: 19 years     Sex: Female     Is Non-Hispanic African American: No     Diabetic: No     Tobacco smoker: No     Systolic Blood Pressure: 008 mmHg     Is BP treated: Yes     HDL Cholesterol: 75 mg/dL     Total Cholesterol: 228 mg/dL  LDL-132 Discussed increased risk of  MI/CVA and that statin therapy would help normalize lipids. Pt vehemently declines statin therapy.   Essential hypertension Both BP checks above goal Pt has been out of Losartan/HCTZ 50/12.5mg  for 4 days, pharmacy back order  Sent Losartan 50mg  QD and HCTZ 12.5mg  QD separately to resume  current dosage regime Pt informed of change due     FOLLOW-UP:  Return for Regular Follow Up, HTN, Obesity.

## 2018-01-05 NOTE — Assessment & Plan Note (Signed)
The 10-year ASCVD risk score Mikey Bussing DC Brooke Bonito., et al., 2013) is: 44.2%   Values used to calculate the score:     Age: 77 years     Sex: Female     Is Non-Hispanic African American: No     Diabetic: No     Tobacco smoker: No     Systolic Blood Pressure: 815 mmHg     Is BP treated: Yes     HDL Cholesterol: 75 mg/dL     Total Cholesterol: 228 mg/dL  LDL-132 Discussed increased risk of MI/CVA and that statin therapy would help normalize lipids. Pt vehemently declines statin therapy.

## 2018-01-05 NOTE — Assessment & Plan Note (Signed)
>>  ASSESSMENT AND PLAN FOR ESSENTIAL HYPERTENSION WRITTEN ON 01/05/2018  9:18 AM BY DANFORD, KATY D, NP  Both BP checks above goal Pt has been out of Losartan/HCTZ 50/12.5mg  for 4 days, pharmacy back order  Sent Losartan 50mg  QD and HCTZ 12.5mg  QD separately to resume current dosage regime Pt informed of change due

## 2018-01-05 NOTE — Patient Instructions (Addendum)
Preventive Care for Adults, Female  A healthy lifestyle and preventive care can promote health and wellness. Preventive health guidelines for women include the following key practices.   A routine yearly physical is a good way to check with your health care provider about your health and preventive screening. It is a chance to share any concerns and updates on your health and to receive a thorough exam.   Visit your dentist for a routine exam and preventive care every 6 months. Brush your teeth twice a day and floss once a day. Good oral hygiene prevents tooth decay and gum disease.   The frequency of eye exams is based on your age, health, family medical history, use of contact lenses, and other factors. Follow your health care provider's recommendations for frequency of eye exams.   Eat a healthy diet. Foods like vegetables, fruits, whole grains, low-fat dairy products, and lean protein foods contain the nutrients you need without too many calories. Decrease your intake of foods high in solid fats, added sugars, and salt. Eat the right amount of calories for you.Get information about a proper diet from your health care provider, if necessary.   Regular physical exercise is one of the most important things you can do for your health. Most adults should get at least 150 minutes of moderate-intensity exercise (any activity that increases your heart rate and causes you to sweat) each week. In addition, most adults need muscle-strengthening exercises on 2 or more days a week.   Maintain a healthy weight. The body mass index (BMI) is a screening tool to identify possible weight problems. It provides an estimate of body fat based on height and weight. Your health care provider can find your BMI, and can help you achieve or maintain a healthy weight.For adults 20 years and older:   - A BMI below 18.5 is considered underweight.   - A BMI of 18.5 to 24.9 is normal.   - A BMI of 25 to 29.9 is  considered overweight.   - A BMI of 30 and above is considered obese.   Maintain normal blood lipids and cholesterol levels by exercising and minimizing your intake of trans and saturated fats.  Eat a balanced diet with plenty of fruit and vegetables. Blood tests for lipids and cholesterol should begin at age 20 and be repeated every 5 years minimum.  If your lipid or cholesterol levels are high, you are over 40, or you are at high risk for heart disease, you may need your cholesterol levels checked more frequently.Ongoing high lipid and cholesterol levels should be treated with medicines if diet and exercise are not working.   If you smoke, find out from your health care provider how to quit. If you do not use tobacco, do not start.   Lung cancer screening is recommended for adults aged 55-80 years who are at high risk for developing lung cancer because of a history of smoking. A yearly low-dose CT scan of the lungs is recommended for people who have at least a 30-pack-year history of smoking and are a current smoker or have quit within the past 15 years. A pack year of smoking is smoking an average of 1 pack of cigarettes a day for 1 year (for example: 1 pack a day for 30 years or 2 packs a day for 15 years). Yearly screening should continue until the smoker has stopped smoking for at least 15 years. Yearly screening should be stopped for people who develop a   health problem that would prevent them from having lung cancer treatment.   If you are pregnant, do not drink alcohol. If you are breastfeeding, be very cautious about drinking alcohol. If you are not pregnant and choose to drink alcohol, do not have more than 1 drink per day. One drink is considered to be 12 ounces (355 mL) of beer, 5 ounces (148 mL) of wine, or 1.5 ounces (44 mL) of liquor.   Avoid use of street drugs. Do not share needles with anyone. Ask for help if you need support or instructions about stopping the use of  drugs.   High blood pressure causes heart disease and increases the risk of stroke. Your blood pressure should be checked at least yearly.  Ongoing high blood pressure should be treated with medicines if weight loss and exercise do not work.   If you are 69-55 years old, ask your health care provider if you should take aspirin to prevent strokes.   Diabetes screening involves taking a blood sample to check your fasting blood sugar level. This should be done once every 3 years, after age 38, if you are within normal weight and without risk factors for diabetes. Testing should be considered at a younger age or be carried out more frequently if you are overweight and have at least 1 risk factor for diabetes.   Breast cancer screening is essential preventive care for women. You should practice "breast self-awareness."  This means understanding the normal appearance and feel of your breasts and may include breast self-examination.  Any changes detected, no matter how small, should be reported to a health care provider.  Women in their 80s and 30s should have a clinical breast exam (CBE) by a health care provider as part of a regular health exam every 1 to 3 years.  After age 66, women should have a CBE every year.  Starting at age 1, women should consider having a mammogram (breast X-ray test) every year.  Women who have a family history of breast cancer should talk to their health care provider about genetic screening.  Women at a high risk of breast cancer should talk to their health care providers about having an MRI and a mammogram every year.   -Breast cancer gene (BRCA)-related cancer risk assessment is recommended for women who have family members with BRCA-related cancers. BRCA-related cancers include breast, ovarian, tubal, and peritoneal cancers. Having family members with these cancers may be associated with an increased risk for harmful changes (mutations) in the breast cancer genes BRCA1 and  BRCA2. Results of the assessment will determine the need for genetic counseling and BRCA1 and BRCA2 testing.   The Pap test is a screening test for cervical cancer. A Pap test can show cell changes on the cervix that might become cervical cancer if left untreated. A Pap test is a procedure in which cells are obtained and examined from the lower end of the uterus (cervix).   - Women should have a Pap test starting at age 57.   - Between ages 90 and 70, Pap tests should be repeated every 2 years.   - Beginning at age 63, you should have a Pap test every 3 years as long as the past 3 Pap tests have been normal.   - Some women have medical problems that increase the chance of getting cervical cancer. Talk to your health care provider about these problems. It is especially important to talk to your health care provider if a  new problem develops soon after your last Pap test. In these cases, your health care provider may recommend more frequent screening and Pap tests.   - The above recommendations are the same for women who have or have not gotten the vaccine for human papillomavirus (HPV).   - If you had a hysterectomy for a problem that was not cancer or a condition that could lead to cancer, then you no longer need Pap tests. Even if you no longer need a Pap test, a regular exam is a good idea to make sure no other problems are starting.   - If you are between ages 36 and 66 years, and you have had normal Pap tests going back 10 years, you no longer need Pap tests. Even if you no longer need a Pap test, a regular exam is a good idea to make sure no other problems are starting.   - If you have had past treatment for cervical cancer or a condition that could lead to cancer, you need Pap tests and screening for cancer for at least 20 years after your treatment.   - If Pap tests have been discontinued, risk factors (such as a new sexual partner) need to be reassessed to determine if screening should  be resumed.   - The HPV test is an additional test that may be used for cervical cancer screening. The HPV test looks for the virus that can cause the cell changes on the cervix. The cells collected during the Pap test can be tested for HPV. The HPV test could be used to screen women aged 70 years and older, and should be used in women of any age who have unclear Pap test results. After the age of 67, women should have HPV testing at the same frequency as a Pap test.   Colorectal cancer can be detected and often prevented. Most routine colorectal cancer screening begins at the age of 57 years and continues through age 26 years. However, your health care provider may recommend screening at an earlier age if you have risk factors for colon cancer. On a yearly basis, your health care provider may provide home test kits to check for hidden blood in the stool.  Use of a small camera at the end of a tube, to directly examine the colon (sigmoidoscopy or colonoscopy), can detect the earliest forms of colorectal cancer. Talk to your health care provider about this at age 23, when routine screening begins. Direct exam of the colon should be repeated every 5 -10 years through age 49 years, unless early forms of pre-cancerous polyps or small growths are found.   People who are at an increased risk for hepatitis B should be screened for this virus. You are considered at high risk for hepatitis B if:  -You were born in a country where hepatitis B occurs often. Talk with your health care provider about which countries are considered high risk.  - Your parents were born in a high-risk country and you have not received a shot to protect against hepatitis B (hepatitis B vaccine).  - You have HIV or AIDS.  - You use needles to inject street drugs.  - You live with, or have sex with, someone who has Hepatitis B.  - You get hemodialysis treatment.  - You take certain medicines for conditions like cancer, organ  transplantation, and autoimmune conditions.   Hepatitis C blood testing is recommended for all people born from [birth year] through 1965 and any individual  with known risks for hepatitis C.   Practice safe sex. Use condoms and avoid high-risk sexual practices to reduce the spread of sexually transmitted infections (STIs). STIs include gonorrhea, chlamydia, syphilis, trichomonas, herpes, HPV, and human immunodeficiency virus (HIV). Herpes, HIV, and HPV are viral illnesses that have no cure. They can result in disability, cancer, and death. Sexually active women aged 25 years and younger should be checked for chlamydia. Older women with new or multiple partners should also be tested for chlamydia. Testing for other STIs is recommended if you are sexually active and at increased risk.   Osteoporosis is a disease in which the bones lose minerals and strength with aging. This can result in serious bone fractures or breaks. The risk of osteoporosis can be identified using a bone density scan. Women ages 65 years and over and women at risk for fractures or osteoporosis should discuss screening with their health care providers. Ask your health care provider whether you should take a calcium supplement or vitamin D to There are also several preventive steps women can take to avoid osteoporosis and resulting fractures or to keep osteoporosis from worsening. -->Recommendations include:  Eat a balanced diet high in fruits, vegetables, calcium, and vitamins.  Get enough calcium. The recommended total intake of is 1,200 mg daily; for best absorption, if taking supplements, divide doses into 250-500 mg doses throughout the day. Of the two types of calcium, calcium carbonate is best absorbed when taken with food but calcium citrate can be taken on an empty stomach.  Get enough vitamin D. NAMS and the National Osteoporosis Foundation recommend at least 1,000 IU per day for women age 50 and over who are at risk of vitamin D  deficiency. Vitamin D deficiency can be caused by inadequate sun exposure (for example, those who live in northern latitudes).  Avoid alcohol and smoking. Heavy alcohol intake (more than 7 drinks per week) increases the risk of falls and hip fracture and women smokers tend to lose bone more rapidly and have lower bone mass than nonsmokers. Stopping smoking is one of the most important changes women can make to improve their health and decrease risk for disease.  Be physically active every day. Weight-bearing exercise (for example, fast walking, hiking, jogging, and weight training) may strengthen bones or slow the rate of bone loss that comes with aging. Balancing and muscle-strengthening exercises can reduce the risk of falling and fracture.  Consider therapeutic medications. Currently, several types of effective drugs are available. Healthcare providers can recommend the type most appropriate for each woman.  Eliminate environmental factors that may contribute to accidents. Falls cause nearly 90% of all osteoporotic fractures, so reducing this risk is an important bone-health strategy. Measures include ample lighting, removing obstructions to walking, using nonskid rugs on floors, and placing mats and/or grab bars in showers.  Be aware of medication side effects. Some common medicines make bones weaker. These include a type of steroid drug called glucocorticoids used for arthritis and asthma, some antiseizure drugs, certain sleeping pills, treatments for endometriosis, and some cancer drugs. An overactive thyroid gland or using too much thyroid hormone for an underactive thyroid can also be a problem. If you are taking these medicines, talk to your doctor about what you can do to help protect your bones.reduce the rate of osteoporosis.    Menopause can be associated with physical symptoms and risks. Hormone replacement therapy is available to decrease symptoms and risks. You should talk to your  health care provider   about whether hormone replacement therapy is right for you.   Use sunscreen. Apply sunscreen liberally and repeatedly throughout the day. You should seek shade when your shadow is shorter than you. Protect yourself by wearing long sleeves, pants, a wide-brimmed hat, and sunglasses year round, whenever you are outdoors.   Once a month, do a whole body skin exam, using a mirror to look at the skin on your back. Tell your health care provider of new moles, moles that have irregular borders, moles that are larger than a pencil eraser, or moles that have changed in shape or color.   -Stay current with required vaccines (immunizations).   Influenza vaccine. All adults should be immunized every year.  Tetanus, diphtheria, and acellular pertussis (Td, Tdap) vaccine. Pregnant women should receive 1 dose of Tdap vaccine during each pregnancy. The dose should be obtained regardless of the length of time since the last dose. Immunization is preferred during the 27th 36th week of gestation. An adult who has not previously received Tdap or who does not know her vaccine status should receive 1 dose of Tdap. This initial dose should be followed by tetanus and diphtheria toxoids (Td) booster doses every 10 years. Adults with an unknown or incomplete history of completing a 3-dose immunization series with Td-containing vaccines should begin or complete a primary immunization series including a Tdap dose. Adults should receive a Td booster every 10 years.  Varicella vaccine. An adult without evidence of immunity to varicella should receive 2 doses or a second dose if she has previously received 1 dose. Pregnant females who do not have evidence of immunity should receive the first dose after pregnancy. This first dose should be obtained before leaving the health care facility. The second dose should be obtained 4 8 weeks after the first dose.  Human papillomavirus (HPV) vaccine. Females aged 13 26  years who have not received the vaccine previously should obtain the 3-dose series. The vaccine is not recommended for use in pregnant females. However, pregnancy testing is not needed before receiving a dose. If a female is found to be pregnant after receiving a dose, no treatment is needed. In that case, the remaining doses should be delayed until after the pregnancy. Immunization is recommended for any person with an immunocompromised condition through the age of 26 years if she did not get any or all doses earlier. During the 3-dose series, the second dose should be obtained 4 8 weeks after the first dose. The third dose should be obtained 24 weeks after the first dose and 16 weeks after the second dose.  Zoster vaccine. One dose is recommended for adults aged 60 years or older unless certain conditions are present.  Measles, mumps, and rubella (MMR) vaccine. Adults born before 1957 generally are considered immune to measles and mumps. Adults born in 1957 or later should have 1 or more doses of MMR vaccine unless there is a contraindication to the vaccine or there is laboratory evidence of immunity to each of the three diseases. A routine second dose of MMR vaccine should be obtained at least 28 days after the first dose for students attending postsecondary schools, health care workers, or international travelers. People who received inactivated measles vaccine or an unknown type of measles vaccine during 1963 1967 should receive 2 doses of MMR vaccine. People who received inactivated mumps vaccine or an unknown type of mumps vaccine before 1979 and are at high risk for mumps infection should consider immunization with 2 doses of   MMR vaccine. For females of childbearing age, rubella immunity should be determined. If there is no evidence of immunity, females who are not pregnant should be vaccinated. If there is no evidence of immunity, females who are pregnant should delay immunization until after pregnancy.  Unvaccinated health care workers born before [birth year] who lack laboratory evidence of measles, mumps, or rubella immunity or laboratory confirmation of disease should consider measles and mumps immunization with 2 doses of MMR vaccine or rubella immunization with 1 dose of MMR vaccine.  Pneumococcal 13-valent conjugate (PCV13) vaccine. When indicated, a person who is uncertain of her immunization history and has no record of immunization should receive the PCV13 vaccine. An adult aged 54 years or older who has certain medical conditions and has not been previously immunized should receive 1 dose of PCV13 vaccine. This PCV13 should be followed with a dose of pneumococcal polysaccharide (PPSV23) vaccine. The PPSV23 vaccine dose should be obtained at least 8 weeks after the dose of PCV13 vaccine. An adult aged 58 years or older who has certain medical conditions and previously received 1 or more doses of PPSV23 vaccine should receive 1 dose of PCV13. The PCV13 vaccine dose should be obtained 1 or more years after the last PPSV23 vaccine dose.  Pneumococcal polysaccharide (PPSV23) vaccine. When PCV13 is also indicated, PCV13 should be obtained first. All adults aged 58 years and older should be immunized. An adult younger than age 65 years who has certain medical conditions should be immunized. Any person who resides in a nursing home or long-term care facility should be immunized. An adult smoker should be immunized. People with an immunocompromised condition and certain other conditions should receive both PCV13 and PPSV23 vaccines. People with human immunodeficiency virus (HIV) infection should be immunized as soon as possible after diagnosis. Immunization during chemotherapy or radiation therapy should be avoided. Routine use of PPSV23 vaccine is not recommended for American Indians, [place] Natives, or people younger than 65 years unless there are medical conditions that require PPSV23 vaccine. When indicated,  people who have unknown immunization and have no record of immunization should receive PPSV23 vaccine. One-time revaccination 5 years after the first dose of PPSV23 is recommended for people aged 70 64 years who have chronic kidney failure, nephrotic syndrome, asplenia, or immunocompromised conditions. People who received 1 2 doses of PPSV23 before age 32 years should receive another dose of PPSV23 vaccine at age 96 years or later if at least 5 years have passed since the previous dose. Doses of PPSV23 are not needed for people immunized with PPSV23 at or after age 55 years.  Meningococcal vaccine. Adults with asplenia or persistent complement component deficiencies should receive 2 doses of quadrivalent meningococcal conjugate (MenACWY-D) vaccine. The doses should be obtained at least 2 months apart. Microbiologists working with certain meningococcal bacteria, [employer] recruits, people at risk during an outbreak, and people who travel to or live in countries with a high rate of meningitis should be immunized. A first-year college student up through age 58 years who is living in a residence hall should receive a dose if she did not receive a dose on or after her 16th birthday. Adults who have certain high-risk conditions should receive one or more doses of vaccine.  Hepatitis A vaccine. Adults who wish to be protected from this disease, have certain high-risk conditions, work with hepatitis A-infected animals, work in hepatitis A research labs, or travel to or work in countries with a high rate of hepatitis A should be  immunized. Adults who were previously unvaccinated and who anticipate close contact with an international adoptee during the first 60 days after arrival in the [place] from a country with a high rate of hepatitis A should be immunized.  Hepatitis B vaccine.  Adults who wish to be protected from this disease, have certain high-risk conditions, may be exposed to blood or other infectious  body fluids, are household contacts or sex partners of hepatitis B positive people, are clients or workers in certain care facilities, or travel to or work in countries with a high rate of hepatitis B should be immunized.  Haemophilus influenzae type b (Hib) vaccine. A previously unvaccinated person with asplenia or sickle cell disease or having a scheduled splenectomy should receive 1 dose of Hib vaccine. Regardless of previous immunization, a recipient of a hematopoietic stem cell transplant should receive a 3-dose series 6 12 months after her successful transplant. Hib vaccine is not recommended for adults with HIV infection.  Preventive Services / Frequency Ages 6 to 39years  Blood pressure check.** / Every 1 to 2 years.  Lipid and cholesterol check.** / Every 5 years beginning at age 39.  Clinical breast exam.** / Every 3 years for women in their 61s and 62s.  BRCA-related cancer risk assessment.** / For women who have family members with a BRCA-related cancer (breast, ovarian, tubal, or peritoneal cancers).  Pap test.** / Every 2 years from ages 47 through 85. Every 3 years starting at age 34 through age 12 or 74 with a history of 3 consecutive normal Pap tests.  HPV screening.** / Every 3 years from ages 46 through ages 43 to 54 with a history of 3 consecutive normal Pap tests.  Hepatitis C blood test.** / For any individual with known risks for hepatitis C.  Skin self-exam. / Monthly.  Influenza vaccine. / Every year.  Tetanus, diphtheria, and acellular pertussis (Tdap, Td) vaccine.** / Consult your health care provider. Pregnant women should receive 1 dose of Tdap vaccine during each pregnancy. 1 dose of Td every 10 years.  Varicella vaccine.** / Consult your health care provider. Pregnant females who do not have evidence of immunity should receive the first dose after pregnancy.  HPV vaccine. / 3 doses over 6 months, if 64 and younger. The vaccine is not recommended for use in  pregnant females. However, pregnancy testing is not needed before receiving a dose.  Measles, mumps, rubella (MMR) vaccine.** / You need at least 1 dose of MMR if you were born in 1957 or later. You may also need a 2nd dose. For females of childbearing age, rubella immunity should be determined. If there is no evidence of immunity, females who are not pregnant should be vaccinated. If there is no evidence of immunity, females who are pregnant should delay immunization until after pregnancy.  Pneumococcal 13-valent conjugate (PCV13) vaccine.** / Consult your health care provider.  Pneumococcal polysaccharide (PPSV23) vaccine.** / 1 to 2 doses if you smoke cigarettes or if you have certain conditions.  Meningococcal vaccine.** / 1 dose if you are age 71 to 37 years and a Market researcher living in a residence hall, or have one of several medical conditions, you need to get vaccinated against meningococcal disease. You may also need additional booster doses.  Hepatitis A vaccine.** / Consult your health care provider.  Hepatitis B vaccine.** / Consult your health care provider.  Haemophilus influenzae type b (Hib) vaccine.** / Consult your health care provider.  Ages 55 to 64years  Blood pressure check.** / Every 1 to 2 years.  Lipid and cholesterol check.** / Every 5 years beginning at age 20 years.  Lung cancer screening. / Every year if you are aged 55 80 years and have a 30-pack-year history of smoking and currently smoke or have quit within the past 15 years. Yearly screening is stopped once you have quit smoking for at least 15 years or develop a health problem that would prevent you from having lung cancer treatment.  Clinical breast exam.** / Every year after age 40 years.  BRCA-related cancer risk assessment.** / For women who have family members with a BRCA-related cancer (breast, ovarian, tubal, or peritoneal cancers).  Mammogram.** / Every year beginning at age 40  years and continuing for as long as you are in good health. Consult with your health care provider.  Pap test.** / Every 3 years starting at age 30 years through age 65 or 70 years with a history of 3 consecutive normal Pap tests.  HPV screening.** / Every 3 years from ages 30 years through ages 65 to 70 years with a history of 3 consecutive normal Pap tests.  Fecal occult blood test (FOBT) of stool. / Every year beginning at age 50 years and continuing until age 75 years. You may not need to do this test if you get a colonoscopy every 10 years.  Flexible sigmoidoscopy or colonoscopy.** / Every 5 years for a flexible sigmoidoscopy or every 10 years for a colonoscopy beginning at age 50 years and continuing until age 75 years.  Hepatitis C blood test.** / For all people born from 1945 through 1965 and any individual with known risks for hepatitis C.  Skin self-exam. / Monthly.  Influenza vaccine. / Every year.  Tetanus, diphtheria, and acellular pertussis (Tdap/Td) vaccine.** / Consult your health care provider. Pregnant women should receive 1 dose of Tdap vaccine during each pregnancy. 1 dose of Td every 10 years.  Varicella vaccine.** / Consult your health care provider. Pregnant females who do not have evidence of immunity should receive the first dose after pregnancy.  Zoster vaccine.** / 1 dose for adults aged 60 years or older.  Measles, mumps, rubella (MMR) vaccine.** / You need at least 1 dose of MMR if you were born in 1957 or later. You may also need a 2nd dose. For females of childbearing age, rubella immunity should be determined. If there is no evidence of immunity, females who are not pregnant should be vaccinated. If there is no evidence of immunity, females who are pregnant should delay immunization until after pregnancy.  Pneumococcal 13-valent conjugate (PCV13) vaccine.** / Consult your health care provider.  Pneumococcal polysaccharide (PPSV23) vaccine.** / 1 to 2 doses if  you smoke cigarettes or if you have certain conditions.  Meningococcal vaccine.** / Consult your health care provider.  Hepatitis A vaccine.** / Consult your health care provider.  Hepatitis B vaccine.** / Consult your health care provider.  Haemophilus influenzae type b (Hib) vaccine.** / Consult your health care provider.  Ages 65 years and over  Blood pressure check.** / Every 1 to 2 years.  Lipid and cholesterol check.** / Every 5 years beginning at age 20 years.  Lung cancer screening. / Every year if you are aged 55 80 years and have a 30-pack-year history of smoking and currently smoke or have quit within the past 15 years. Yearly screening is stopped once you have quit smoking for at least 15 years or develop a health problem that   would prevent you from having lung cancer treatment.  Clinical breast exam.** / Every year after age 103 years.  BRCA-related cancer risk assessment.** / For women who have family members with a BRCA-related cancer (breast, ovarian, tubal, or peritoneal cancers).  Mammogram.** / Every year beginning at age 36 years and continuing for as long as you are in good health. Consult with your health care provider.  Pap test.** / Every 3 years starting at age 5 years through age 85 or 10 years with 3 consecutive normal Pap tests. Testing can be stopped between 65 and 70 years with 3 consecutive normal Pap tests and no abnormal Pap or HPV tests in the past 10 years.  HPV screening.** / Every 3 years from ages 93 years through ages 70 or 45 years with a history of 3 consecutive normal Pap tests. Testing can be stopped between 65 and 70 years with 3 consecutive normal Pap tests and no abnormal Pap or HPV tests in the past 10 years.  Fecal occult blood test (FOBT) of stool. / Every year beginning at age 8 years and continuing until age 45 years. You may not need to do this test if you get a colonoscopy every 10 years.  Flexible sigmoidoscopy or colonoscopy.** /  Every 5 years for a flexible sigmoidoscopy or every 10 years for a colonoscopy beginning at age 69 years and continuing until age 68 years.  Hepatitis C blood test.** / For all people born from [birth year] through 1965 and any individual with known risks for hepatitis C.  Osteoporosis screening.** / A one-time screening for women ages 7 years and over and women at risk for fractures or osteoporosis.  Skin self-exam. / Monthly.  Influenza vaccine. / Every year.  Tetanus, diphtheria, and acellular pertussis (Tdap/Td) vaccine.** / 1 dose of Td every 10 years.  Varicella vaccine.** / Consult your health care provider.  Zoster vaccine.** / 1 dose for adults aged 5 years or older.  Pneumococcal 13-valent conjugate (PCV13) vaccine.** / Consult your health care provider.  Pneumococcal polysaccharide (PPSV23) vaccine.** / 1 dose for all adults aged 74 years and older.  Meningococcal vaccine.** / Consult your health care provider.  Hepatitis A vaccine.** / Consult your health care provider.  Hepatitis B vaccine.** / Consult your health care provider.  Haemophilus influenzae type b (Hib) vaccine.** / Consult your health care provider. ** Family history and personal history of risk and conditions may change your health care provider's recommendations. Document Released: 08/04/2001 Document Revised: 03/29/2013  [facility] Patient Information 2014 [place], [place].   EXERCISE AND DIET:  We recommended that you start or continue a regular exercise program for good health. Regular exercise means any activity that makes your heart beat faster and makes you sweat.  We recommend exercising at least 30 minutes per day at least 3 days a week, preferably 5.  We also recommend a diet low in fat and sugar / carbohydrates.  Inactivity, poor dietary choices and obesity can cause diabetes, heart attack, stroke, and kidney damage, among others.     ALCOHOL AND SMOKING:  Women should limit their alcohol intake to no  more than 7 drinks/beers/glasses of wine (combined, not each!) per week. Moderation of alcohol intake to this level decreases your risk of breast cancer and liver damage.  ( And of course, no recreational drugs are part of a healthy lifestyle.)  Also, you should not be smoking at all or even being exposed to second hand smoke. Most people know smoking can  cause cancer, and various heart and lung diseases, but did you know it also contributes to weakening of your bones?  Aging of your skin?  Yellowing of your teeth and nails?   CALCIUM AND VITAMIN D:  Adequate intake of calcium and Vitamin D are recommended.  The recommendations for exact amounts of these supplements seem to change often, but generally speaking 600 mg of calcium (either carbonate or citrate) and 800 units of Vitamin D per day seems prudent. Certain women may benefit from higher intake of Vitamin D.  If you are among these women, your doctor will have told you during your visit.     PAP SMEARS:  Pap smears, to check for cervical cancer or precancers,  have traditionally been done yearly, although recent scientific advances have shown that most women can have pap smears less often.  However, every woman still should have a physical exam from her gynecologist or primary care physician every year. It will include a breast check, inspection of the vulva and vagina to check for abnormal growths or skin changes, a visual exam of the cervix, and then an exam to evaluate the size and shape of the uterus and ovaries.  And after 77 years of age, a rectal exam is indicated to check for rectal cancers. We will also provide age appropriate advice regarding health maintenance, like when you should have certain vaccines, screening for sexually transmitted diseases, bone density testing, colonoscopy, mammograms, etc.    MAMMOGRAMS:  All women over 65 years old should have a yearly mammogram. Many facilities now offer a "3D" mammogram, which may cost  around $50 extra out of pocket. If possible,  we recommend you accept the option to have the 3D mammogram performed.  It both reduces the number of women who will be called back for extra views which then turn out to be normal, and it is better than the routine mammogram at detecting truly abnormal areas.     COLONOSCOPY:  Colonoscopy to screen for colon cancer is recommended for all women at age 26.  We know, you hate the idea of the prep.  We agree, BUT, having colon cancer and not knowing it is worse!!  Colon cancer so often starts as a polyp that can be seen and removed at colonscopy, which can quite literally save your life!  And if your first colonoscopy is normal and you have no family history of colon cancer, most women don't have to have it again for 10 years.  Once every ten years, you can do something that may end up saving your life, right?  We will be happy to help you get it scheduled when you are ready.  Be sure to check your insurance coverage so you understand how much it will cost.  It may be covered as a preventative service at no cost, but you should check your particular policy.    Mediterranean Diet A Mediterranean diet refers to food and lifestyle choices that are based on the traditions of countries located on the [employer]. This way of eating has been shown to help prevent certain conditions and improve outcomes for people who have chronic diseases, like kidney disease and heart disease. What are tips for following this plan? Lifestyle  Cook and eat meals together with your family, when possible.  Drink enough fluid to keep your urine clear or pale yellow.  Be physically active every day. This includes: ? Aerobic exercise like running or swimming. ? Leisure activities like  gardening, walking, or housework.  Get 7-8 hours of sleep each night.  If recommended by your health care provider, drink red wine in moderation. This means 1 glass a day for nonpregnant women  and 2 glasses a day for men. A glass of wine equals 5 oz (150 mL). Reading food labels  Check the serving size of packaged foods. For foods such as rice and pasta, the serving size refers to the amount of cooked product, not dry.  Check the total fat in packaged foods. Avoid foods that have saturated fat or trans fats.  Check the ingredients list for added sugars, such as corn syrup. Shopping  At the grocery store, buy most of your food from the areas near the walls of the store. This includes: ? Fresh fruits and vegetables (produce). ? Grains, beans, nuts, and seeds. Some of these may be available in unpackaged forms or large amounts (in bulk). ? Fresh seafood. ? Poultry and eggs. ? Low-fat dairy products.  Buy whole ingredients instead of prepackaged foods.  Buy fresh fruits and vegetables in-season from local farmers markets.  Buy frozen fruits and vegetables in resealable bags.  If you do not have access to quality fresh seafood, buy precooked frozen shrimp or canned fish, such as tuna, salmon, or sardines.  Buy small amounts of raw or cooked vegetables, salads, or olives from the deli or salad bar at your store.  Stock your pantry so you always have certain foods on hand, such as olive oil, canned tuna, canned tomatoes, rice, pasta, and beans. Cooking  Cook foods with extra-virgin olive oil instead of using butter or other vegetable oils.  Have meat as a side dish, and have vegetables or grains as your main dish. This means having meat in small portions or adding small amounts of meat to foods like pasta or stew.  Use beans or vegetables instead of meat in common dishes like chili or lasagna.  Experiment with different cooking methods. Try roasting or broiling vegetables instead of steaming or sauteing them.  Add frozen vegetables to soups, stews, pasta, or rice.  Add nuts or seeds for added healthy fat at each meal. You can add these to yogurt, salads, or vegetable  dishes.  Marinate fish or vegetables using olive oil, lemon juice, garlic, and fresh herbs. Meal planning  Plan to eat 1 vegetarian meal one day each week. Try to work up to 2 vegetarian meals, if possible.  Eat seafood 2 or more times a week.  Have healthy snacks readily available, such as: ? Vegetable sticks with hummus. ? [place] yogurt. ? Fruit and nut trail mix.  Eat balanced meals throughout the week. This includes: ? Fruit: 2-3 servings a day ? Vegetables: 4-5 servings a day ? Low-fat dairy: 2 servings a day ? Fish, poultry, or lean meat: 1 serving a day ? Beans and legumes: 2 or more servings a week ? Nuts and seeds: 1-2 servings a day ? Whole grains: 6-8 servings a day ? Extra-virgin olive oil: 3-4 servings a day  Limit red meat and sweets to only a few servings a month What are my food choices?  Mediterranean diet ? Recommended ? Grains: Whole-grain pasta. Brown rice. Bulgar wheat. Polenta. Couscous. Whole-wheat bread. [place]. ? Vegetables: Artichokes. Beets. Broccoli. Cabbage. Carrots. Eggplant. Green beans. Chard. Kale. Spinach. Onions. Leeks. Peas. Squash. Tomatoes. Peppers. Radishes. ? Fruits: Apples. Apricots. Avocado. Berries. Bananas. Cherries. Dates. Figs. Grapes. Lemons. Melon. Oranges. Peaches. Plums. Pomegranate. ? Meats and other protein  foods: Beans. Almonds. Sunflower seeds. Pine nuts. Peanuts. [place]. Salmon. Scallops. Shrimp. [place]. Tilapia. Clams. Oysters. Eggs. ? Dairy: Low-fat milk. Cheese. Greek yogurt. ? Beverages: Water. Red wine. Herbal tea. ? Fats and oils: Extra virgin olive oil. Avocado oil. Grape seed oil. ? Sweets and desserts: [place] yogurt with honey. Baked apples. Poached pears. Trail mix. ? Seasoning and other foods: Basil. Cilantro. Coriander. Cumin. Mint. Parsley. Sage. Rosemary. Tarragon. Garlic. Oregano. Thyme. Pepper. Balsalmic vinegar. Tahini. Hummus. Tomato sauce. Olives. Mushrooms. ? Limit these ? Grains: Prepackaged pasta or  rice dishes. Prepackaged cereal with added sugar. ? Vegetables: Deep fried potatoes (french fries). ? Fruits: Fruit canned in syrup. ? Meats and other protein foods: Beef. Pork. Lamb. Poultry with skin. Hot dogs. [place] Salines. ? Dairy: Ice cream. Sour cream. Whole milk. ? Beverages: Juice. Sugar-sweetened soft drinks. Beer. Liquor and spirits. ? Fats and oils: Butter. Canola oil. Vegetable oil. Beef fat (tallow). Lard. ? Sweets and desserts: Cookies. Cakes. Pies. Candy. ? Seasoning and other foods: Mayonnaise. Premade sauces and marinades. ? The items listed may not be a complete list. Talk with your dietitian about what dietary choices are right for you. Summary  The Mediterranean diet includes both food and lifestyle choices.  Eat a variety of fresh fruits and vegetables, beans, nuts, seeds, and whole grains.  Limit the amount of red meat and sweets that you eat.  Talk with your health care provider about whether it is safe for you to drink red wine in moderation. This means 1 glass a day for nonpregnant women and 2 glasses a day for men. A glass of wine equals 5 oz (150 mL). This information is not intended to replace advice given to you by your health care provider. Make sure you discuss any questions you have with your health care provider. Document Released: 01/30/2016 Document Revised: 03/03/2016 Document Reviewed: 01/30/2016 Elsevier Interactive Patient Education  2018 [place] your blood pressure medication. If you ever have a problem filling a rx, please call clinic. Please take Doxycycline as directed to treat sinusitis and bronchitis. Reduce saturated fat and resume regular exercise once you feel better. Continue to drink water and follow Mediterranean diet. Please continue all other medications as directed. Follow-up in 3 months. NICE TO SEE YOU!

## 2018-01-05 NOTE — Assessment & Plan Note (Signed)
Body mass index is 41.96 kg/m.  Current wt 267 Discussed at length the importance of regular exercise and following a Mediterranean diet.

## 2018-01-05 NOTE — Telephone Encounter (Signed)
Last visit: 11/04/17 Next Visit: 04/07/18 Labs: 11/04/17 CBC WNL. Glucose 109. Creatinine elevated and GFR mildly low.   Okay to refill per Dr. [name]

## 2018-02-08 ENCOUNTER — Other Ambulatory Visit: Payer: Self-pay

## 2018-02-08 DIAGNOSIS — Z79899 Other long term (current) drug therapy: Secondary | ICD-10-CM | POA: Diagnosis not present

## 2018-02-08 LAB — HEPATIC FUNCTION PANEL
ALK PHOS: 59 (ref 25–125)
ALT: 18 (ref 7–35)
AST: 17 (ref 13–35)
BILIRUBIN, TOTAL: 0.5

## 2018-02-08 LAB — CBC AND DIFFERENTIAL
HCT: 42 (ref 36–46)
Hemoglobin: 14.1 (ref 12.0–16.0)
NEUTROS ABS: 4968
PLATELETS: 258 (ref 150–399)
WBC: 7.2

## 2018-02-08 LAB — BASIC METABOLIC PANEL
BUN: 17 (ref 4–21)
Creatinine: 1 (ref <=1.1)
Glucose: 130
Potassium: 4.8 (ref 3.4–5.3)
Sodium: 142 (ref 137–147)

## 2018-02-09 LAB — COMPLETE METABOLIC PANEL WITH GFR
AG Ratio: 1.6 (calc) (ref 1.0–2.5)
ALKALINE PHOSPHATASE (APISO): 59 U/L (ref 33–130)
ALT: 18 U/L (ref 6–29)
AST: 17 U/L (ref 10–35)
Albumin: 4 g/dL (ref 3.6–5.1)
BUN/Creatinine Ratio: 18 (calc) (ref 6–22)
BUN: 17 mg/dL (ref 7–25)
CALCIUM: 9.4 mg/dL (ref 8.6–10.4)
CO2: 27 mmol/L (ref 20–32)
CREATININE: 0.97 mg/dL — AB (ref 0.60–0.93)
Chloride: 106 mmol/L (ref 98–110)
GFR, Est African American: 65 mL/min/{1.73_m2} (ref >=60)
GFR, Est Non African American: 56 mL/min/{1.73_m2} — ABNORMAL LOW (ref >=60)
GLUCOSE: 130 mg/dL — AB (ref 65–99)
Globulin: 2.5 g/dL (calc) (ref 1.9–3.7)
Potassium: 4.8 mmol/L (ref 3.5–5.3)
SODIUM: 142 mmol/L (ref 135–146)
Total Bilirubin: 0.5 mg/dL (ref 0.2–1.2)
Total Protein: 6.5 g/dL (ref 6.1–8.1)

## 2018-02-09 LAB — CBC WITH DIFFERENTIAL/PLATELET
BASOS PCT: 0.7 %
Basophils Absolute: 50 cells/uL (ref 0–200)
EOS ABS: 180 {cells}/uL (ref 15–500)
Eosinophils Relative: 2.5 %
HCT: 42.1 % (ref 35.0–45.0)
Hemoglobin: 14.1 g/dL (ref 11.7–15.5)
Lymphs Abs: 1418 cells/uL (ref 850–3900)
MCH: 29.7 pg (ref 27.0–33.0)
MCHC: 33.5 g/dL (ref 32.0–36.0)
MCV: 88.6 fL (ref 80.0–100.0)
MONOS PCT: 8.1 %
MPV: 10.1 fL (ref 7.5–12.5)
Neutro Abs: 4968 cells/uL (ref 1500–7800)
Neutrophils Relative %: 69 %
PLATELETS: 258 10*3/uL (ref 140–400)
RBC: 4.75 10*6/uL (ref 3.80–5.10)
RDW: 13.1 % (ref 11.0–15.0)
TOTAL LYMPHOCYTE: 19.7 %
WBC mixed population: 583 cells/uL (ref 200–950)
WBC: 7.2 10*3/uL (ref 3.8–10.8)

## 2018-02-15 ENCOUNTER — Telehealth: Payer: Self-pay | Admitting: Adult Health

## 2018-02-15 ENCOUNTER — Telehealth: Payer: Self-pay | Admitting: Rheumatology

## 2018-02-15 NOTE — Telephone Encounter (Signed)
Patent was calling to get lab results from last week. Patient also wants a copy of results sent to her, along with her PCP.

## 2018-02-15 NOTE — Telephone Encounter (Addendum)
Labs Faxed.

## 2018-02-15 NOTE — Telephone Encounter (Signed)
Pt states at last OV she was told a bowel prep testing kit would be sent to her (ask if cologuard or Hemocult? ) she states unknown . --Forwarding message to medical assistant to review chart & contact patient.  --glh

## 2018-02-15 NOTE — Telephone Encounter (Signed)
Pt states that she does not want to do Cologuard but will do IFOBs.  Advised pt to stop by the office to pick up collection kit.  Pt expressed understanding and is agreeable.  Charyl Bigger, CMA

## 2018-02-16 NOTE — Telephone Encounter (Signed)
Patient advised glucose was elevated and kidney function has improved. Copy sent to PCP. Patient verbalzied understanding.

## 2018-02-24 NOTE — Progress Notes (Signed)
Labs are stable.

## 2018-04-01 ENCOUNTER — Telehealth: Payer: Self-pay | Admitting: Adult Health

## 2018-04-01 NOTE — Telephone Encounter (Signed)
Patient called and state that her a few of her nails are lifting of the nail bed and wants to discuss what she should do. She said she is willing to come in for an appt but wants me to send message to PCP first to see if that is the correct course of action or if there is something else that should be done. Please advise

## 2018-04-04 NOTE — Telephone Encounter (Signed)
Pt advised that she will need OV to evaluate.  Pt expressed understanding and is agreeable.  Pt transferred to front desk to schedule appt.  Charyl Bigger, CMA

## 2018-04-05 NOTE — Progress Notes (Signed)
Subjective:    Patient ID: Tina Hansen, female    DOB: 04/09/1941, 77 y.o.   MRN: [id number]  HPI:  Ms. Poth presents for regular f/u: HTN, Obesity, HLD, and one acute complaint-  L 4th finger fungal infection that started when she noticed her "nail bed lifting off skin about two weeks ago" She has been having artificial nails applied >40 years. Chronic onychomycosis of toenails Treated with oral Lamisil >10 years, developed severe reaction after 1 day of use Due to family visiting she has not been exercising or following a heart healthy diet. 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 She plans on resuming regular use of "Health Rider", working up to "40 reps/day" She denies tobacco/ETOH use She will have complete set of labs drawn at end of month with Rheumatologist - results will be available to [place]. We again discussed her sig elevated chol, she declined starting statin therapy.  Patient Care Team    Relationship Specialty Notifications Start End  Mina Marble D, NP PCP - General Family Medicine  09/08/17   Noralee Space, MD Consulting Physician Pulmonary Disease  09/08/17   Bo Merino, MD Consulting Physician Rheumatology  09/08/17     Patient Active Problem List   Diagnosis Date Noted  . Onychomycosis 04/06/2018  . Decreased GFR 01/05/2018  . Healthcare maintenance 09/08/2017  . Lipoma of left upper extremity 08/09/2017  . COPD mixed type ([place]) 06/28/2017  . History of COPD 06/26/2016  . History of hypertension 06/26/2016  . Osteopenia of multiple sites 06/26/2016  . Morbid obesity ([place]) 05/04/2016  . High risk medication use 04/25/2016  . Vocal cord dysfunction 04/29/2015  . Seropositive rheumatoid arthritis of multiple sites ([place]) 04/29/2015  . Hyperlipidemia 10/06/2007  . Essential hypertension 10/06/2007  . OTHER DISEASES OF VOCAL CORDS 10/06/2007  . CERVICAL CANCER, HX OF 10/06/2007     Past Medical History:   Diagnosis Date  . Asthma   . Cervical cancer ([place])   . Hyperlipidemia   . Hypertension   . Rheumatoid arthritis(714.0)    [name]     Past Surgical History:  Procedure Laterality Date  . ABDOMINAL HYSTERECTOMY    . BREAST SURGERY    . Breat implants    . LIPOMA EXCISION Left    shoulder   . TUBAL LIGATION    . TUMOR REMOVAL Right    FOREARM     Family History  Problem Relation Age of Onset  . Asthma Mother   . Breast cancer Mother   . Cancer Mother   . Alcohol abuse Father   . Diabetes Son   . Cancer Maternal Uncle   . Heart attack Maternal Uncle      Social History   Substance and Sexual Activity  Drug Use No     Social History   Substance and Sexual Activity  Alcohol Use Yes   Comment: OCCASSIONAL GLASS OF WINE 1-2 MONTHLY     Social History   Tobacco Use  Smoking Status Former Smoker  . Packs/day: 1.00  . Years: 30.00  . Pack years: 30.00  . Types: Cigarettes  . Last attempt to quit: 06/22/1985  . Years since quitting: 32.8  Smokeless Tobacco Never Used     Outpatient Encounter Medications as of 04/06/2018  Medication Sig  . albuterol (PROVENTIL) (2.5 MG/3ML) 0.083% nebulizer solution USE ONE VIAL IN NEBULIZER EVERY 6 HOURS AS NEEDED  . albuterol (VENTOLIN HFA) 108 (90 Base) MCG/ACT inhaler Inhale 1-2  puffs into the lungs every 6 (six) hours as needed for wheezing or shortness of breath.  . Biotin 5000 MCG CAPS Take 10,000 mcg by mouth daily.  . budesonide-formoterol (SYMBICORT) 160-4.5 MCG/ACT inhaler INHALE TWO PUFFS BY  MOUTH ONCE TO TWICE DAILY  . calcium gluconate 500 MG tablet Take 500 mg by mouth daily.    . Cholecalciferol (VITAMIN D3) 1000 units CAPS Take 1 capsule by mouth daily.   . Chromium 1 MG CAPS Take 1 mg by mouth daily. Increase to 2 caps if needed  . Coenzyme Q10 (COQ-10) 100 MG CAPS Take 1 capsule by mouth daily.    . fish oil-omega-3 fatty acids 1000 MG capsule Take 2 capsules by mouth daily.    . folic acid  (FOLVITE) 1 MG tablet TAKE 1 TABLET BY MOUTH ONCE DAILY  . hydrochlorothiazide (MICROZIDE) 12.5 MG capsule Take 1 capsule (12.5 mg total) by mouth daily.  . IODINE, KELP, PO Put 5 drops in a liquid and drink once daily.  Marland Kitchen losartan (COZAAR) 25 MG tablet 2 tabs daily to equal 50mg  once daily dose  . LUTEIN PO Take by mouth daily.  . Methotrexate Sodium (METHOTREXATE, PF,) 50 MG/2ML injection INJECT 0.3ML ONCE A WEEK  . Methylsulfonylmethane (MSM PO) Take by mouth daily.  . Milk Thistle 200 MG CAPS Take 400 mg by mouth daily.    . Multiple Vitamin (MULTIVITAMIN) capsule Take 1 capsule by mouth daily.    . NON FORMULARY Take 1 capsule by mouth daily. Collagen capsule  . vitamin E (VITAMIN E) 400 UNIT capsule Take 400 Units by mouth daily.  . [DISCONTINUED] doxycycline (VIBRA-TABS) 100 MG tablet Take 1 tablet (100 mg total) by mouth 2 (two) times daily.   No facility-administered encounter medications on file as of 04/06/2018.     Allergies: Other and Theophylline  Body mass index is 41.71 kg/m.  Blood pressure (!) 147/73, pulse 92, height 5\' 7"  (1.702 m), weight 266 lb 4.8 oz (120.8 kg), SpO2 97 %.  Review of Systems  Constitutional: Positive for fatigue. Negative for activity change, appetite change, chills, diaphoresis, fever and unexpected weight change.  HENT: Negative for congestion.   Respiratory: Negative for cough, chest tightness, shortness of breath, wheezing and stridor.   Cardiovascular: Negative for chest pain, palpitations and leg swelling.  Gastrointestinal: Negative for abdominal distention, anal bleeding, blood in stool, constipation, diarrhea, nausea, rectal pain and vomiting.  Genitourinary: Negative for difficulty urinating and enuresis.  Musculoskeletal: Positive for arthralgias, back pain and myalgias.  Skin: Positive for color change.  Neurological: Negative for dizziness and headaches.  Hematological: Does not bruise/bleed easily.  Psychiatric/Behavioral:  Negative for behavioral problems, confusion, decreased concentration, dysphoric mood, hallucinations, self-injury, sleep disturbance and suicidal ideas. The patient is not nervous/anxious and is not hyperactive.        Objective:   Physical Exam  Constitutional: She is oriented to person, place, and time. She appears well-developed and well-nourished. No distress.  HENT:  Head: Normocephalic and atraumatic.  Right Ear: External ear normal. Decreased hearing is noted.  Left Ear: External ear normal. Decreased hearing is noted.  Nose: Nose normal.  Mouth/Throat: Oropharynx is clear and moist.  Eyes: Pupils are equal, round, and reactive to light. Conjunctivae and EOM are normal.  Cardiovascular: Normal rate, regular rhythm, normal heart sounds and intact distal pulses.  No murmur heard. Pulmonary/Chest: Effort normal and breath sounds normal. No stridor. No respiratory distress. She has no wheezes. She has no rales. She exhibits no  tenderness.  Neurological: She is alert and oriented to person, place, and time.  Skin: Skin is warm, dry and intact. Capillary refill takes less than 2 seconds. She is not diaphoretic. No pallor.  L 4th nail bed- thickened, yellow, faking nailbed  The rest of nails are covered with gels nails unable to visualize nail beds No signs of infection noted   Psychiatric: She has a normal mood and affect. Her behavior is normal. Judgment and thought content normal.  Nursing note and vitals reviewed.     Assessment & Plan:   1. Need for pneumococcal vaccination   2. Essential hypertension   3. Healthcare maintenance   4. Mixed hyperlipidemia   5. Onychomycosis     Healthcare maintenance 1)Continue all medications as directed. 2) We will look for your labs at end of month that your Rheumatologist will run, especially cholesterol levels.   3) Recommend that you take off artificial nails and avoid future use. Start using OTC antifungal topical nail  polish. 4)Increase water intake, strive for at least 64 ounces/day.   Follow Mediterranean diet Increase regular exercise.  Recommend at least 30 minutes daily, 5 days per week of walking, health rider, biking, swimming, YouTube/Pinterest workout videos. 5) Continue with Pulmonologist and Rheumatologist as directed. 6) Please schedule regular follow-up in 4 months.  Essential hypertension BP 147/73, HR 92 Continue Losartan 50 mg QD, HCTZ 12.5mg  QD  Hyperlipidemia The 10-year ASCVD risk score Mikey Bussing DC Jr., et al., 2013) is: 33.3%   Values used to calculate the score:     Age: 78 years     Sex: Female     Is Non-Hispanic African American: No     Diabetic: No     Tobacco smoker: No     Systolic Blood Pressure: 569 mmHg     Is BP treated: Yes     HDL Cholesterol: 75 mg/dL     Total Cholesterol: 228 mg/dL  LDL-132 We discussed starting statin therapy, she declined  Encouraged her to reduce saturated fat and increase regular exercise   Onychomycosis Chronic onychomycosis of toenails Treated with oral Lamisil >10 years, developed severe reaction after 1 day of use New development of onychomycosis on L 4th fingernail 2 weeks ago Advised to remove artificial nails and use topical antifungal solution    FOLLOW-UP:  Return for Regular Follow Up, HTN, Obesity, Hypercholestermia.

## 2018-04-05 NOTE — Progress Notes (Deleted)
Office Visit Note  Patient: Tina Hansen             Date of Birth: 11-30-1940           MRN: [id number]             PCP: Esaw Grandchild, NP Referring: Esaw Grandchild, NP Visit Date: 04/19/2018 Occupation: @GUAROCC @  Subjective:  No chief complaint on file.   History of Present Illness: Tina Hansen is a 77 y.o. female ***   Activities of Daily Living:  Patient reports morning stiffness for *** {minute/hour:19697}.   Patient {ACTIONS;DENIES/REPORTS:21021675::"Denies"} nocturnal pain.  Difficulty dressing/grooming: {ACTIONS;DENIES/REPORTS:21021675::"Denies"} Difficulty climbing stairs: {ACTIONS;DENIES/REPORTS:21021675::"Denies"} Difficulty getting out of chair: {ACTIONS;DENIES/REPORTS:21021675::"Denies"} Difficulty using hands for taps, buttons, cutlery, and/or writing: {ACTIONS;DENIES/REPORTS:21021675::"Denies"}  No Rheumatology ROS completed.   PMFS History:  Patient Active Problem List   Diagnosis Date Noted  . Decreased GFR 01/05/2018  . Healthcare maintenance 09/08/2017  . Lipoma of left upper extremity 08/09/2017  . COPD mixed type ([place]) 06/28/2017  . History of COPD 06/26/2016  . History of hypertension 06/26/2016  . Osteopenia of multiple sites 06/26/2016  . Morbid obesity ([place]) 05/04/2016  . High risk medication use 04/25/2016  . Vocal cord dysfunction 04/29/2015  . Seropositive rheumatoid arthritis of multiple sites ([place]) 04/29/2015  . Hyperlipidemia 10/06/2007  . Essential hypertension 10/06/2007  . OTHER DISEASES OF VOCAL CORDS 10/06/2007  . CERVICAL CANCER, HX OF 10/06/2007    Past Medical History:  Diagnosis Date  . Asthma   . Cervical cancer ([place])   . Hyperlipidemia   . Hypertension   . Rheumatoid arthritis(714.0)    [name]    Family History  Problem Relation Age of Onset  . Asthma Mother   . Breast cancer Mother   . Cancer Mother   . Alcohol abuse Father   . Diabetes Son   . Cancer Maternal Uncle   . Heart attack Maternal  Uncle    Past Surgical History:  Procedure Laterality Date  . ABDOMINAL HYSTERECTOMY    . BREAST SURGERY    . Breat implants    . LIPOMA EXCISION Left    shoulder   . TUBAL LIGATION    . TUMOR REMOVAL Right    FOREARM   Social History   Social History Narrative  . Not on file   Immunization History  Administered Date(s) Administered  . Pneumococcal Conjugate-13 04/06/2018  . Pneumococcal Polysaccharide-23 06/27/2007, 03/22/2009    Objective: Vital Signs: There were no vitals taken for this visit.   Physical Exam   Musculoskeletal Exam: ***  CDAI Exam: CDAI Score: Not documented Patient Global Assessment: Not documented; Provider Global Assessment: Not documented Swollen: Not documented; Tender: Not documented Joint Exam   Not documented   There is currently no information documented on the homunculus. Go to the Rheumatology activity and complete the homunculus joint exam.  Investigation: No additional findings.  Imaging: No results found.  Recent Labs: Lab Results  Component Value Date   WBC 7.2 02/08/2018   HGB 14.1 02/08/2018   PLT 258 02/08/2018   NA 142 02/08/2018   K 4.8 02/08/2018   CL 106 02/08/2018   CO2 27 02/08/2018   GLUCOSE 130 (H) 02/08/2018   BUN 17 02/08/2018   CREATININE 0.97 (H) 02/08/2018   BILITOT 0.5 02/08/2018   ALKPHOS 59 02/08/2018   AST 17 02/08/2018   ALT 18 02/08/2018   PROT 6.5 02/08/2018   ALBUMIN 4.0 12/31/2016   CALCIUM 9.4 02/08/2018  GFRAA 65 02/08/2018    Speciality Comments: No specialty comments available.  Procedures:  No procedures performed Allergies: Other and Theophylline   Assessment / Plan:     Recommend annual flu and Shingrix as indicated. Visit Diagnoses: No diagnosis found.   Orders: No orders of the defined types were placed in this encounter.  No orders of the defined types were placed in this encounter.   Face-to-face time spent with patient was *** minutes. Greater than 50% of time  was spent in counseling and coordination of care.  Follow-Up Instructions: No follow-ups on file.   Earnestine Mealing, CMA  Note - This record has been created using Editor, commissioning.  Chart creation errors have been sought, but may not always  have been located. Such creation errors do not reflect on  the standard of medical care.

## 2018-04-06 ENCOUNTER — Encounter: Payer: Self-pay | Admitting: Adult Health

## 2018-04-06 ENCOUNTER — Ambulatory Visit (INDEPENDENT_AMBULATORY_CARE_PROVIDER_SITE_OTHER): Payer: Medicare HMO | Admitting: Adult Health

## 2018-04-06 VITALS — BP 147/73 | HR 92 | Ht 67.0 in | Wt 266.3 lb

## 2018-04-06 DIAGNOSIS — I10 Essential (primary) hypertension: Secondary | ICD-10-CM | POA: Diagnosis not present

## 2018-04-06 DIAGNOSIS — Z23 Encounter for immunization: Secondary | ICD-10-CM | POA: Diagnosis not present

## 2018-04-06 DIAGNOSIS — B351 Tinea unguium: Secondary | ICD-10-CM

## 2018-04-06 DIAGNOSIS — Z0000 Encounter for general adult medical examination without abnormal findings: Secondary | ICD-10-CM

## 2018-04-06 DIAGNOSIS — E782 Mixed hyperlipidemia: Secondary | ICD-10-CM | POA: Diagnosis not present

## 2018-04-06 NOTE — Patient Instructions (Addendum)
Hypertension Hypertension, commonly called high blood pressure, is when the force of blood pumping through the arteries is too strong. The arteries are the blood vessels that carry blood from the heart throughout the body. Hypertension forces the heart to work harder to pump blood and may cause arteries to become narrow or stiff. Having untreated or uncontrolled hypertension can cause heart attacks, strokes, kidney disease, and other problems. A blood pressure reading consists of a higher number over a lower number. Ideally, your blood pressure should be below 120/80. The first ("top") number is called the systolic pressure. It is a measure of the pressure in your arteries as your heart beats. The second ("bottom") number is called the diastolic pressure. It is a measure of the pressure in your arteries as the heart relaxes. What are the causes? The cause of this condition is not known. What increases the risk? Some risk factors for high blood pressure are under your control. Others are not. Factors you can change  Smoking.  Having type 2 diabetes mellitus, high cholesterol, or both.  Not getting enough exercise or physical activity.  Being overweight.  Having too much fat, sugar, calories, or salt (sodium) in your diet.  Drinking too much alcohol. Factors that are difficult or impossible to change  Having chronic kidney disease.  Having a family history of high blood pressure.  Age. Risk increases with age.  Race. You may be at higher risk if you are African-American.  Gender. Men are at higher risk than women before age 45. After age 65, women are at higher risk than men.  Having obstructive sleep apnea.  Stress. What are the signs or symptoms? Extremely high blood pressure (hypertensive crisis) may cause:  Headache.  Anxiety.  Shortness of breath.  Nosebleed.  Nausea and vomiting.  Severe chest pain.  Jerky movements you cannot control (seizures).  How is this  diagnosed? This condition is diagnosed by measuring your blood pressure while you are seated, with your arm resting on a surface. The cuff of the blood pressure monitor will be placed directly against the skin of your upper arm at the level of your heart. It should be measured at least twice using the same arm. Certain conditions can cause a difference in blood pressure between your right and left arms. Certain factors can cause blood pressure readings to be lower or higher than normal (elevated) for a short period of time:  When your blood pressure is higher when you are in a health care provider's office than when you are at home, this is called white coat hypertension. Most people with this condition do not need medicines.  When your blood pressure is higher at home than when you are in a health care provider's office, this is called masked hypertension. Most people with this condition may need medicines to control blood pressure.  If you have a high blood pressure reading during one visit or you have normal blood pressure with other risk factors:  You may be asked to return on a different day to have your blood pressure checked again.  You may be asked to monitor your blood pressure at home for 1 week or longer.  If you are diagnosed with hypertension, you may have other blood or imaging tests to help your health care provider understand your overall risk for other conditions. How is this treated? This condition is treated by making healthy lifestyle changes, such as eating healthy foods, exercising more, and reducing your alcohol intake. Your   health care provider may prescribe medicine if lifestyle changes are not enough to get your blood pressure under control, and if:  Your systolic blood pressure is above 130.  Your diastolic blood pressure is above 80.  Your personal target blood pressure may vary depending on your medical conditions, your age, and other factors. Follow these  instructions at home: Eating and drinking  Eat a diet that is high in fiber and potassium, and low in sodium, added sugar, and fat. An example eating plan is called the DASH (Dietary Approaches to Stop Hypertension) diet. To eat this way: ? Eat plenty of fresh fruits and vegetables. Try to fill half of your plate at each meal with fruits and vegetables. ? Eat whole grains, such as whole wheat pasta, brown rice, or whole grain bread. Fill about one quarter of your plate with whole grains. ? Eat or drink low-fat dairy products, such as skim milk or low-fat yogurt. ? Avoid fatty cuts of meat, processed or cured meats, and poultry with skin. Fill about one quarter of your plate with lean proteins, such as fish, chicken without skin, beans, eggs, and tofu. ? Avoid premade and processed foods. These tend to be higher in sodium, added sugar, and fat.  Reduce your daily sodium intake. Most people with hypertension should eat less than 1,500 mg of sodium a day.  Limit alcohol intake to no more than 1 drink a day for nonpregnant women and 2 drinks a day for men. One drink equals 12 oz of beer, 5 oz of wine, or 1 oz of hard liquor. Lifestyle  Work with your health care provider to maintain a healthy body weight or to lose weight. Ask what an ideal weight is for you.  Get at least 30 minutes of exercise that causes your heart to beat faster (aerobic exercise) most days of the week. Activities may include walking, swimming, or biking.  Include exercise to strengthen your muscles (resistance exercise), such as pilates or lifting weights, as part of your weekly exercise routine. Try to do these types of exercises for 30 minutes at least 3 days a week.  Do not use any products that contain nicotine or tobacco, such as cigarettes and e-cigarettes. If you need help quitting, ask your health care provider.  Monitor your blood pressure at home as told by your health care provider.  Keep all follow-up visits as  told by your health care provider. This is important. Medicines  Take over-the-counter and prescription medicines only as told by your health care provider. Follow directions carefully. Blood pressure medicines must be taken as prescribed.  Do not skip doses of blood pressure medicine. Doing this puts you at risk for problems and can make the medicine less effective.  Ask your health care provider about side effects or reactions to medicines that you should watch for. Contact a health care provider if:  You think you are having a reaction to a medicine you are taking.  You have headaches that keep coming back (recurring).  You feel dizzy.  You have swelling in your ankles.  You have trouble with your vision. Get help right away if:  You develop a severe headache or confusion.  You have unusual weakness or numbness.  You feel faint.  You have severe pain in your chest or abdomen.  You vomit repeatedly.  You have trouble breathing. Summary  Hypertension is when the force of blood pumping through your arteries is too strong. If this condition is not   controlled, it may put you at risk for serious complications.  Your personal target blood pressure may vary depending on your medical conditions, your age, and other factors. For most people, a normal blood pressure is less than 120/80.  Hypertension is treated with lifestyle changes, medicines, or a combination of both. Lifestyle changes include weight loss, eating a healthy, low-sodium diet, exercising more, and limiting alcohol. This information is not intended to replace advice given to you by your health care provider. Make sure you discuss any questions you have with your health care provider. Document Released: 06/08/2005 Document Revised: 05/06/2016 Document Reviewed: 05/06/2016 Elsevier Interactive Patient Education  2018 [place] refers to food and lifestyle choices that  are based on the traditions of countries located on the [employer]. This way of eating has been shown to help prevent certain conditions and improve outcomes for people who have chronic diseases, like kidney disease and heart disease. What are tips for following this plan? Lifestyle  Cook and eat meals together with your family, when possible.  Drink enough fluid to keep your urine clear or pale yellow.  Be physically active every day. This includes: ? Aerobic exercise like running or swimming. ? Leisure activities like gardening, walking, or housework.  Get 7-8 hours of sleep each night.  If recommended by your health care provider, drink red wine in moderation. This means 1 glass a day for nonpregnant women and 2 glasses a day for men. A glass of wine equals 5 oz (150 mL). Reading food labels  Check the serving size of packaged foods. For foods such as rice and pasta, the serving size refers to the amount of cooked product, not dry.  Check the total fat in packaged foods. Avoid foods that have saturated fat or trans fats.  Check the ingredients list for added sugars, such as corn syrup. Shopping  At the grocery store, buy most of your food from the areas near the walls of the store. This includes: ? Fresh fruits and vegetables (produce). ? Grains, beans, nuts, and seeds. Some of these may be available in unpackaged forms or large amounts (in bulk). ? Fresh seafood. ? Poultry and eggs. ? Low-fat dairy products.  Buy whole ingredients instead of prepackaged foods.  Buy fresh fruits and vegetables in-season from local farmers markets.  Buy frozen fruits and vegetables in resealable bags.  If you do not have access to quality fresh seafood, buy precooked frozen shrimp or canned fish, such as tuna, salmon, or sardines.  Buy small amounts of raw or cooked vegetables, salads, or olives from the deli or salad bar at your store.  Stock your pantry so you always have certain  foods on hand, such as olive oil, canned tuna, canned tomatoes, rice, pasta, and beans. Cooking  Cook foods with extra-virgin olive oil instead of using butter or other vegetable oils.  Have meat as a side dish, and have vegetables or grains as your main dish. This means having meat in small portions or adding small amounts of meat to foods like pasta or stew.  Use beans or vegetables instead of meat in common dishes like chili or lasagna.  Experiment with different cooking methods. Try roasting or broiling vegetables instead of steaming or sauteing them.  Add frozen vegetables to soups, stews, pasta, or rice.  Add nuts or seeds for added healthy fat at each meal. You can add these to yogurt, salads, or vegetable dishes.  Marinate  fish or vegetables using olive oil, lemon juice, garlic, and fresh herbs. Meal planning  Plan to eat 1 vegetarian meal one day each week. Try to work up to 2 vegetarian meals, if possible.  Eat seafood 2 or more times a week.  Have healthy snacks readily available, such as: ? Vegetable sticks with hummus. ? [place] yogurt. ? Fruit and nut trail mix.  Eat balanced meals throughout the week. This includes: ? Fruit: 2-3 servings a day ? Vegetables: 4-5 servings a day ? Low-fat dairy: 2 servings a day ? Fish, poultry, or lean meat: 1 serving a day ? Beans and legumes: 2 or more servings a week ? Nuts and seeds: 1-2 servings a day ? Whole grains: 6-8 servings a day ? Extra-virgin olive oil: 3-4 servings a day  Limit red meat and sweets to only a few servings a month What are my food choices?  Mediterranean diet ? Recommended ? Grains: Whole-grain pasta. Brown rice. Bulgar wheat. Polenta. Couscous. Whole-wheat bread. [place]. ? Vegetables: Artichokes. Beets. Broccoli. Cabbage. Carrots. Eggplant. Green beans. Chard. Kale. Spinach. Onions. Leeks. Peas. Squash. Tomatoes. Peppers. Radishes. ? Fruits: Apples. Apricots. Avocado. Berries. Bananas.  [place]. Dates. Figs. Grapes. Lemons. Melon. Oranges. Peaches. Plums. Pomegranate. ? Meats and other protein foods: Beans. Almonds. Sunflower seeds. Pine nuts. Peanuts. [place]. Salmon. Scallops. Shrimp. [place]. Tilapia. Clams. Oysters. Eggs. ? Dairy: Low-fat milk. Cheese. Greek yogurt. ? Beverages: Water. Red wine. Herbal tea. ? Fats and oils: Extra virgin olive oil. Avocado oil. Grape seed oil. ? Sweets and desserts: [place] yogurt with honey. Baked apples. Poached pears. Trail mix. ? Seasoning and other foods: Basil. Cilantro. Coriander. Cumin. Mint. Parsley. Sage. Rosemary. Tarragon. Garlic. Oregano. Thyme. Pepper. Balsalmic vinegar. Tahini. Hummus. Tomato sauce. Olives. Mushrooms. ? Limit these ? Grains: Prepackaged pasta or rice dishes. Prepackaged cereal with added sugar. ? Vegetables: Deep fried potatoes (french fries). ? Fruits: Fruit canned in syrup. ? Meats and other protein foods: Beef. Pork. Lamb. Poultry with skin. Hot dogs. [place] Salines. ? Dairy: Ice cream. Sour cream. Whole milk. ? Beverages: Juice. Sugar-sweetened soft drinks. Beer. Liquor and spirits. ? Fats and oils: Butter. Canola oil. Vegetable oil. Beef fat (tallow). Lard. ? Sweets and desserts: Cookies. Cakes. Pies. Candy. ? Seasoning and other foods: Mayonnaise. Premade sauces and marinades. ? The items listed may not be a complete list. Talk with your dietitian about what dietary choices are right for you. Summary  The Mediterranean diet includes both food and lifestyle choices.  Eat a variety of fresh fruits and vegetables, beans, nuts, seeds, and whole grains.  Limit the amount of red meat and sweets that you eat.  Talk with your health care provider about whether it is safe for you to drink red wine in moderation. This means 1 glass a day for nonpregnant women and 2 glasses a day for men. A glass of wine equals 5 oz (150 mL). This information is not intended to replace advice given to you by your health care provider. Make  sure you discuss any questions you have with your health care provider. Document Released: 01/30/2016 Document Revised: 03/03/2016 Document Reviewed: 01/30/2016 Elsevier Interactive Patient Education  2018 [employer].  1)Continue all medications as directed. 2) We will look for your labs at end of month that your Rheumatologist will run, especially cholesterol levels.   3) Recommend that you take off artificial nails and avoid future use. Start using OTC antifungal topical nail polish. 4)Increase water intake, strive for at least 64 ounces/day.  Follow Mediterranean diet Increase regular exercise.  Recommend at least 30 minutes daily, 5 days per week of walking, health rider, biking, swimming, YouTube/Pinterest workout videos. 5) Continue with Pulmonologist and Rheumatologist as directed. 6) Please schedule regular follow-up in 4 months. NICE TO SEE YOU!

## 2018-04-06 NOTE — Assessment & Plan Note (Addendum)
BP 147/73, HR 92 Continue Losartan 50 mg QD, HCTZ 12.5mg  QD

## 2018-04-06 NOTE — Assessment & Plan Note (Signed)
The 10-year ASCVD risk score Mikey Bussing DC Brooke Bonito., et al., 2013) is: 33.3%   Values used to calculate the score:     Age: 77 years     Sex: Female     Is Non-Hispanic African American: No     Diabetic: No     Tobacco smoker: No     Systolic Blood Pressure: 290 mmHg     Is BP treated: Yes     HDL Cholesterol: 75 mg/dL     Total Cholesterol: 228 mg/dL  [id number] We discussed starting statin therapy, she declined  Encouraged her to reduce saturated fat and increase regular exercise

## 2018-04-06 NOTE — Assessment & Plan Note (Signed)
1)Continue all medications as directed. 2) We will look for your labs at end of month that your Rheumatologist will run, especially cholesterol levels.   3) Recommend that you take off artificial nails and avoid future use. Start using OTC antifungal topical nail polish. 4)Increase water intake, strive for at least 64 ounces/day.   Follow Mediterranean diet Increase regular exercise.  Recommend at least 30 minutes daily, 5 days per week of walking, health rider, biking, swimming, YouTube/Pinterest workout videos. 5) Continue with Pulmonologist and Rheumatologist as directed. 6) Please schedule regular follow-up in 4 months.

## 2018-04-06 NOTE — Assessment & Plan Note (Signed)
Chronic onychomycosis of toenails Treated with oral Lamisil >10 years, developed severe reaction after 1 day of use New development of onychomycosis on L 4th fingernail 2 weeks ago Advised to remove artificial nails and use topical antifungal solution

## 2018-04-06 NOTE — Assessment & Plan Note (Signed)
>>  ASSESSMENT AND PLAN FOR ESSENTIAL HYPERTENSION WRITTEN ON 04/06/2018  2:29 PM BY DANFORD, KATY D, NP  BP 147/73, HR 92 Continue Losartan 50 mg QD, HCTZ 12.5mg  QD

## 2018-04-07 ENCOUNTER — Ambulatory Visit: Payer: Medicare HMO | Admitting: Rheumatology

## 2018-04-11 ENCOUNTER — Ambulatory Visit: Payer: Medicare HMO | Admitting: Adult Health

## 2018-04-19 ENCOUNTER — Ambulatory Visit: Payer: Medicare HMO | Admitting: Rheumatology

## 2018-04-21 NOTE — Progress Notes (Signed)
Office Visit Note  Patient: Tina Hansen             Date of Birth: 19-Aug-1940           MRN: [id number]             PCP: Esaw Grandchild, NP Referring: Esaw Grandchild, NP Visit Date: 04/28/2018 Occupation: @GUAROCC @  Subjective:  Medication monitoring   History of Present Illness: Tina Hansen is a 77 y.o. female  with history of seropositive rheumatoid arthritis.  She denies any recent flares of rheumatoid arthritis.  She is on MTX 0.3 ml sq once weekly and folic acid 1 mg by mouth daily.  Her last injection was yesterday. She is having a running nose right now but denies any other symptoms.  She states that she refuses to get the flu shot since she is immunocompromised.  She denies any joint pain or joint swelling at this time.  She has occasional bilateral shoulder pain but has good ROM.  She has some neck stiffness.  She denies symptoms of radiculopathy.  She continues to take calcium and vitamin D daily.     Activities of Daily Living:  Patient reports morning stiffness for 0 none.   Patient Denies nocturnal pain.  Difficulty dressing/grooming: Denies Difficulty climbing stairs: Reports Difficulty getting out of chair: Denies Difficulty using hands for taps, buttons, cutlery, and/or writing: Denies  Review of Systems  Constitutional: Positive for fatigue.  HENT: Negative for mouth sores, mouth dryness and nose dryness.   Eyes: Negative for pain, visual disturbance and dryness.  Respiratory: Negative for cough, hemoptysis, shortness of breath and difficulty breathing.   Cardiovascular: Negative for chest pain, palpitations, hypertension and swelling in legs/feet.  Gastrointestinal: Negative for blood in stool, constipation and diarrhea.  Endocrine: Negative for increased urination.  Genitourinary: Negative for difficulty urinating and painful urination.  Musculoskeletal: Positive for muscle weakness and muscle tenderness. Negative for arthralgias, joint pain,  joint swelling, myalgias, morning stiffness and myalgias.  Skin: Negative for color change, pallor, rash, hair loss, nodules/bumps, skin tightness, ulcers and sensitivity to sunlight.  Allergic/Immunologic: Negative for susceptible to infections.  Neurological: Negative for dizziness, numbness, headaches and weakness.  Hematological: Negative for bruising/bleeding tendency and swollen glands.  Psychiatric/Behavioral: Positive for sleep disturbance. Negative for depressed mood. The patient is not nervous/anxious.     PMFS History:  Patient Active Problem List   Diagnosis Date Noted  . Onychomycosis 04/06/2018  . Decreased GFR 01/05/2018  . Healthcare maintenance 09/08/2017  . Lipoma of left upper extremity 08/09/2017  . COPD mixed type ([place]) 06/28/2017  . History of COPD 06/26/2016  . History of hypertension 06/26/2016  . Osteopenia of multiple sites 06/26/2016  . Morbid obesity ([place]) 05/04/2016  . High risk medication use 04/25/2016  . Vocal cord dysfunction 04/29/2015  . Seropositive rheumatoid arthritis of multiple sites ([place]) 04/29/2015  . Hyperlipidemia 10/06/2007  . Essential hypertension 10/06/2007  . OTHER DISEASES OF VOCAL CORDS 10/06/2007  . CERVICAL CANCER, HX OF 10/06/2007    Past Medical History:  Diagnosis Date  . Asthma   . Cervical cancer ([place])   . Emphysema lung ([place])   . Hyperlipidemia   . Hypertension   . Rheumatoid arthritis(714.0)    [name]    Family History  Problem Relation Age of Onset  . Asthma Mother   . Breast cancer Mother   . Cancer Mother   . Alcohol abuse Father   . Diabetes Son   .  Cancer Maternal Uncle   . Heart attack Maternal Uncle    Past Surgical History:  Procedure Laterality Date  . ABDOMINAL HYSTERECTOMY    . BREAST SURGERY    . Breat implants    . LIPOMA EXCISION Left    shoulder   . TUBAL LIGATION    . TUMOR REMOVAL Right    FOREARM   Social History   Social History Narrative  . Not on file   Immunization  History  Administered Date(s) Administered  . Pneumococcal Conjugate-13 04/06/2018  . Pneumococcal Polysaccharide-23 06/27/2007, 03/22/2009   Objective: Vital Signs: BP 139/78 (BP Location: Left Arm, Patient Position: Sitting, Cuff Size: Normal)   Resp 20   Ht 5\' 7"  (1.702 m)   Wt 263 lb 6.4 oz (119.5 kg)   BMI 41.25 kg/m    Physical Exam  Constitutional: She is oriented to person, place, and time. She appears well-developed and well-nourished.  HENT:  Head: Normocephalic and atraumatic.  Eyes: Conjunctivae and EOM are normal.  Neck: Normal range of motion.  Cardiovascular: Normal rate, regular rhythm, normal heart sounds and intact distal pulses.  Pulmonary/Chest: Effort normal. She has wheezes.  Abdominal: Soft. Bowel sounds are normal.  Lymphadenopathy:    She has no cervical adenopathy.  Neurological: She is alert and oriented to person, place, and time.  Skin: Skin is warm and dry. Capillary refill takes less than 2 seconds.  Psychiatric: She has a normal mood and affect. Her behavior is normal.  Nursing note and vitals reviewed.    Musculoskeletal Exam: C-spine limited ROM with lateral rotation to the right.  Thoracic and lumbar spine good ROM.  No midline spinal tenderness.  No SI joint tenderness.  Shoulder joints good ROM with minimal discomfort.  Elbow joints, wrist joints, MCPs, PIPs, and DIPs good ROM with no synovitis.  Hip joints, knee joints, ankle joints, MTPs, PIPs, and DIPs good ROM with no synovitis.  No warmth or effusion of knee joints.  No tenderness or swelling of ankle joints.  No tenderness of trochanteric bursa bilaterally.   CDAI Exam: CDAI Score: 0.4  Patient Global Assessment: 2 (mm); Provider Global Assessment: 2 (mm) Swollen: 0 ; Tender: 0  Joint Exam   Not documented   There is currently no information documented on the homunculus. Go to the Rheumatology activity and complete the homunculus joint exam.  Investigation: No additional  findings.  Imaging: No results found.  Recent Labs: Lab Results  Component Value Date   WBC 7.2 02/08/2018   HGB 14.1 02/08/2018   PLT 258 02/08/2018   NA 142 02/08/2018   K 4.8 02/08/2018   CL 106 02/08/2018   CO2 27 02/08/2018   GLUCOSE 130 (H) 02/08/2018   BUN 17 02/08/2018   CREATININE 0.97 (H) 02/08/2018   BILITOT 0.5 02/08/2018   ALKPHOS 59 02/08/2018   AST 17 02/08/2018   ALT 18 02/08/2018   PROT 6.5 02/08/2018   ALBUMIN 4.0 12/31/2016   CALCIUM 9.4 02/08/2018   GFRAA 65 02/08/2018    Speciality Comments: No specialty comments available.  Procedures:  No procedures performed Allergies: Other and Theophylline   Assessment / Plan:     Visit Diagnoses: Seropositive rheumatoid arthritis of multiple sites ([place]) - +RF, +CCP, h/o elevated sed rate: She has no synovitis on exam.  She has not had any recent rheumatoid arthritis flares.  She has no joint pain or joint swelling at this time.  She has occasional bilateral shoulder pain but has good ROM  on exam.  She has no morning stiffness. She is clinically doing well on MTX 0.3 ml sq once weekly and folic acid 1 mg by mouth daily. A refill of MTX will be sent to the pharmacy.  She was advised to notify if she develops increased joint pain or joint swelling.  She will follow up in 5 months.   High risk medication use - MTX 0.3 mL subcutaneous every week, folic acid 1 mg by mouth daily. CBC and CMP were drawn today to monitor for drug toxicity.  She will return for lab work in February and every 3 months. - Plan: COMPLETE METABOLIC PANEL WITH GFR, CBC with Differential/Platelet  Osteopenia of multiple sites: She continues to take a vitamin D and calcium supplement daily.   Vitamin D deficiency: She takes vitamin D 1,000 units by mouth daily.   History of hyperlipidemia - According to the patient she is fasting and would like her lipid panel checked.  We will forward results to PCP. Plan: Lipid panel  Encounter for  health-related screening - She requested her HgB A1c to be checked today.  We will forward results to PCP. Plan: HgB A1c   Other medical conditions are listed as follows:   CERVICAL CANCER, HX OF  History of COPD  History of hypertension   Orders: Orders Placed This Encounter  Procedures  . COMPLETE METABOLIC PANEL WITH GFR  . CBC with Differential/Platelet  . Lipid panel  . HgB A1c   No orders of the defined types were placed in this encounter.     Follow-Up Instructions: Return in about 5 months (around 09/27/2018) for Rheumatoid arthritis.   Ofilia Neas, PA-C  Note - This record has been created using Dragon software.  Chart creation errors have been sought, but may not always  have been located. Such creation errors do not reflect on  the standard of medical care.

## 2018-04-28 ENCOUNTER — Ambulatory Visit: Payer: Medicare HMO | Admitting: Physician Assistant

## 2018-04-28 ENCOUNTER — Encounter: Payer: Self-pay | Admitting: Physician Assistant

## 2018-04-28 VITALS — BP 139/78 | Resp 20 | Ht 67.0 in | Wt 263.4 lb

## 2018-04-28 DIAGNOSIS — E559 Vitamin D deficiency, unspecified: Secondary | ICD-10-CM | POA: Diagnosis not present

## 2018-04-28 DIAGNOSIS — Z139 Encounter for screening, unspecified: Secondary | ICD-10-CM

## 2018-04-28 DIAGNOSIS — M0579 Rheumatoid arthritis with rheumatoid factor of multiple sites without organ or systems involvement: Secondary | ICD-10-CM

## 2018-04-28 DIAGNOSIS — Z8709 Personal history of other diseases of the respiratory system: Secondary | ICD-10-CM | POA: Diagnosis not present

## 2018-04-28 DIAGNOSIS — Z8541 Personal history of malignant neoplasm of cervix uteri: Secondary | ICD-10-CM | POA: Diagnosis not present

## 2018-04-28 DIAGNOSIS — M8589 Other specified disorders of bone density and structure, multiple sites: Secondary | ICD-10-CM | POA: Diagnosis not present

## 2018-04-28 DIAGNOSIS — Z79899 Other long term (current) drug therapy: Secondary | ICD-10-CM

## 2018-04-28 DIAGNOSIS — Z8639 Personal history of other endocrine, nutritional and metabolic disease: Secondary | ICD-10-CM | POA: Diagnosis not present

## 2018-04-28 DIAGNOSIS — Z8679 Personal history of other diseases of the circulatory system: Secondary | ICD-10-CM

## 2018-04-28 MED ORDER — METHOTREXATE SODIUM CHEMO INJECTION (PF) 50 MG/2ML: INTRAMUSCULAR | 0 refills | Status: DC

## 2018-04-28 NOTE — Patient Instructions (Signed)
Standing Labs We placed an order today for your standing lab work.    Please come back and get your standing labs in February and every 3 months  We have open lab Monday through Friday from 8:30-11:30 AM and 1:30-4:00 PM  at the office of Dr. Shaili [name].   You may experience shorter wait times on Monday and Friday afternoons. The office is located at [address], [address], [place], [place] [address] No appointment is necessary.   Labs are drawn by [facility].  You may receive a bill from [facility] for your lab work. If you have any questions regarding directions or hours of operation,  please call [phone].   Just as a reminder please drink plenty of water prior to coming for your lab work. Thanks!  

## 2018-04-29 LAB — LIPID PANEL
Cholesterol: 239 mg/dL — ABNORMAL HIGH (ref <200)
HDL: 74 mg/dL (ref >50)
LDL Cholesterol (Calc): 143 mg/dL — ABNORMAL HIGH
Non-HDL Cholesterol (Calc): 165 mg/dL — ABNORMAL HIGH (ref <130)
Total CHOL/HDL Ratio: 3.2 (calc) (ref <5.0)
Triglycerides: 104 mg/dL (ref <150)

## 2018-04-29 LAB — COMPLETE METABOLIC PANEL WITH GFR
AG Ratio: 1.5 (calc) (ref 1.0–2.5)
ALKALINE PHOSPHATASE (APISO): 55 U/L (ref 33–130)
ALT: 23 U/L (ref 6–29)
AST: 25 U/L (ref 10–35)
Albumin: 4.3 g/dL (ref 3.6–5.1)
BILIRUBIN TOTAL: 0.6 mg/dL (ref 0.2–1.2)
BUN/Creatinine Ratio: 22 (calc) (ref 6–22)
BUN: 22 mg/dL (ref 7–25)
CHLORIDE: 103 mmol/L (ref 98–110)
CO2: 29 mmol/L (ref 20–32)
Calcium: 10 mg/dL (ref 8.6–10.4)
Creat: 0.99 mg/dL — ABNORMAL HIGH (ref 0.60–0.93)
GFR, Est African American: 64 mL/min/{1.73_m2} (ref >=60)
GFR, Est Non African American: 55 mL/min/{1.73_m2} — ABNORMAL LOW (ref >=60)
GLUCOSE: 135 mg/dL — AB (ref 65–99)
Globulin: 2.8 g/dL (calc) (ref 1.9–3.7)
Potassium: 4.8 mmol/L (ref 3.5–5.3)
Sodium: 142 mmol/L (ref 135–146)
Total Protein: 7.1 g/dL (ref 6.1–8.1)

## 2018-04-29 LAB — CBC WITH DIFFERENTIAL/PLATELET
Basophils Absolute: 58 {cells}/uL (ref 0–200)
Basophils Relative: 0.9 %
Eosinophils Absolute: 192 {cells}/uL (ref 15–500)
Eosinophils Relative: 3 %
HCT: 43.4 % (ref 35.0–45.0)
Hemoglobin: 14.3 g/dL (ref 11.7–15.5)
Lymphs Abs: 960 {cells}/uL (ref 850–3900)
MCH: 29.7 pg (ref 27.0–33.0)
MCHC: 32.9 g/dL (ref 32.0–36.0)
MCV: 90.2 fL (ref 80.0–100.0)
MPV: 10.4 fL (ref 7.5–12.5)
Monocytes Relative: 9.2 %
Neutro Abs: 4602 {cells}/uL (ref 1500–7800)
Neutrophils Relative %: 71.9 %
Platelets: 275 Thousand/uL (ref 140–400)
RBC: 4.81 Million/uL (ref 3.80–5.10)
RDW: 13.5 % (ref 11.0–15.0)
Total Lymphocyte: 15 %
WBC mixed population: 589 {cells}/uL (ref 200–950)
WBC: 6.4 Thousand/uL (ref 3.8–10.8)

## 2018-04-29 LAB — HEMOGLOBIN A1C
HEMOGLOBIN A1C: 6.1 %{Hb} — AB (ref <5.7)
Mean Plasma Glucose: 128 (calc)
eAG (mmol/L): 7.1 (calc)

## 2018-04-29 NOTE — Progress Notes (Signed)
Glucose is 135.  Creatinine and GFR stable.  CBC WNL. Hgb A1c is 6.1. Cholesterol and LDL are elevated. Please forward lab results to PCP.

## 2018-05-30 ENCOUNTER — Telehealth: Payer: Self-pay | Admitting: Adult Health

## 2018-05-30 NOTE — Telephone Encounter (Signed)
Please advise.  T. Preslynn Bier, CMA 

## 2018-05-30 NOTE — Telephone Encounter (Signed)
Patient is requesting an order for ciclopirox 8% cream for an ongoing nail bed issue she has been having. She got some of this cream from a friend and she states that it was done wonders but she is almost out. If approved please send prescription to [facility] on [address]. Patient would like call back either way to know if it was sent or if it was not sent.

## 2018-05-31 ENCOUNTER — Other Ambulatory Visit: Payer: Self-pay | Admitting: Adult Health

## 2018-05-31 MED ORDER — CICLOPIROX OLAMINE 0.77 % EX CREA: TOPICAL_CREAM | Freq: Two times a day (BID) | CUTANEOUS | 0 refills | Status: DC

## 2018-05-31 NOTE — Telephone Encounter (Signed)
Rx cream sent in -[place]

## 2018-05-31 NOTE — Telephone Encounter (Signed)
VM box has not been set up.  Unable to leave message.  Charyl Bigger, CMA

## 2018-06-01 NOTE — Telephone Encounter (Signed)
MyChart message sent to patient informing her of RX.  Charyl Bigger, CMA

## 2018-07-03 ENCOUNTER — Other Ambulatory Visit: Payer: Self-pay | Admitting: Pulmonary Disease

## 2018-07-04 ENCOUNTER — Other Ambulatory Visit: Payer: Self-pay | Admitting: Pulmonary Disease

## 2018-07-08 ENCOUNTER — Other Ambulatory Visit: Payer: Self-pay | Admitting: Adult Health

## 2018-07-08 NOTE — Telephone Encounter (Signed)
Copied from [place] [id number]. Topic: Quick Communication - Rx Refill/Question >> Jul 08, 2018  1:42 PM Windy Kalata wrote: Medication: albuterol (VENTOLIN HFA) 108 (90 Base) MCG/ACT inhaler,budesonide-formoterol (SYMBICORT) 160-4.5 MCG/ACT inhaler  Has the patient contacted their pharmacy? Yes.   (Agent: If no, request that the patient contact the pharmacy for the refill.) (Agent: If yes, when and what did the pharmacy advise?) Pharmacy states that it was denied, please advise. Patient is almost out of and needs. She states Dr told her that she did not need an appt just let him know if anything had changed.  Preferred Pharmacy (with phone number or street name): [facility] ([address] [place] [phone] (Phone) [phone] (Fax)    Agent: Please be advised that RX refills may take up to 3 business days. We ask that you follow-up with your pharmacy.

## 2018-07-10 ENCOUNTER — Other Ambulatory Visit: Payer: Self-pay | Admitting: Physician Assistant

## 2018-07-11 ENCOUNTER — Other Ambulatory Visit: Payer: Self-pay | Admitting: *Deleted

## 2018-07-11 ENCOUNTER — Telehealth: Payer: Self-pay | Admitting: Pulmonary Disease

## 2018-07-11 DIAGNOSIS — R69 Illness, unspecified: Secondary | ICD-10-CM | POA: Diagnosis not present

## 2018-07-11 MED ORDER — BUDESONIDE-FORMOTEROL FUMARATE 160-4.5 MCG/ACT IN AERO: INHALATION_SPRAY | RESPIRATORY_TRACT | 2 refills | Status: DC

## 2018-07-11 MED ORDER — ALBUTEROL SULFATE (2.5 MG/3ML) 0.083% IN NEBU: INHALATION_SOLUTION | RESPIRATORY_TRACT | 2 refills | Status: DC

## 2018-07-11 NOTE — Telephone Encounter (Signed)
Per SN- ok for refill for albuterol inhaler and symbicort, with 2 additional refills.  Have Patient follow up with Dr. [name], in 2-3 months.  Called and spoke with Patient.  OV with Dr. [name] scheduled for 09/13/18, at 2pm, as new Patient.  Refill placed.  Nothing further at this time.

## 2018-07-11 NOTE — Telephone Encounter (Addendum)
Last visit: 04/28/18 Next Visit: 09/26/18 Labs: 04/28/18 Glucose is 135. Creatinine and GFR stable. CBC WNL  Okay to refill per Dr. [name]

## 2018-07-20 ENCOUNTER — Telehealth: Payer: Self-pay | Admitting: Pulmonary Disease

## 2018-07-20 MED ORDER — ALBUTEROL SULFATE HFA 108 (90 BASE) MCG/ACT IN AERS: 1.0000 | INHALATION_SPRAY | Freq: Four times a day (QID) | RESPIRATORY_TRACT | 2 refills | Status: DC | PRN

## 2018-07-20 NOTE — Telephone Encounter (Signed)
Called and spoke with Patient.  She is scheduled with Dr [name], 09/13/18.  She is needing a refill of Albuterol inhaler.  Refill sent to requested pharmacy [facility].  Nothing further at this time.

## 2018-08-02 ENCOUNTER — Other Ambulatory Visit: Payer: Self-pay | Admitting: *Deleted

## 2018-08-02 DIAGNOSIS — Z79899 Other long term (current) drug therapy: Secondary | ICD-10-CM | POA: Diagnosis not present

## 2018-08-02 NOTE — Progress Notes (Signed)
Subjective:    Patient ID: Tina Hansen, female    DOB: June 16, 1941, 78 y.o.   MRN: [id number]  HPI: 04/06/2018 OV:  Tina Hansen presents for regular f/u: HTN, Obesity, HLD, and one acute complaint-  L 4th finger fungal infection that started when she noticed her "nail bed lifting off skin about two weeks ago" She has been having artificial nails applied >40 years. Chronic onychomycosis of toenails Treated with oral Lamisil >10 years, developed severe reaction after 1 day of use Due to family visiting she has not been exercising or following a heart healthy diet. 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 She plans on resuming regular use of "[employer]", working up to "40 reps/day" She denies tobacco/ETOH use She will have complete set of labs drawn at end of month with Rheumatologist - results will be available to [place]. We again discussed her sig elevated chol, she declined starting statin therapy.  08/08/2018 OV: Tina Hansen is here for regular f/u: HTN, Obesity, HLD She reports taking her Losartan 25mg  2 tabs at at different times of the day- she has not taken today and first BP 179/81, HR 88- repeat slightly improved but still above goal She denies denies acute cardiac sx's She report home readings- SBP 130-140, mean in upper 130s DBP 60-80s, mean in 70s She denies CP/palpitations She has baseline dyspnea, denies increase She has Pulmonology appt next month She estimates to drink >40 oz water a day and "just bought an air fryer" in an attempt to try and eat better She denies tobacco/vape/ETOH use  Last CMP 07/2018, GFR 58 The 10-year ASCVD risk score Mikey Bussing DC Jr., et al., 2013) is: 38.2%   Values used to calculate the score:     Age: 62 years     Sex: Female     Is Non-Hispanic African American: No     Diabetic: No     Tobacco smoker: No     Systolic Blood Pressure: 440 mmHg     Is BP treated: Yes     HDL Cholesterol: 74 mg/dL      Total Cholesterol: 239 mg/dL  LDL-143, declined statin therapy Discussed that her risk for CVA/MI is elevated, especially with her uncontrolled BP She states "I will wait and see what my next round of blood work"  Again encouraged heart healthy diet and to increase regular movement/exercise- as much as tolerated with mixed type COPD   Patient Care Team    Relationship Specialty Notifications Start End  Mina Marble D, NP PCP - General Family Medicine  09/08/17   Noralee Space, MD Consulting Physician Pulmonary Disease  09/08/17   Bo Merino, MD Consulting Physician Rheumatology  09/08/17     Patient Active Problem List   Diagnosis Date Noted  . BMI 40.0-44.9, adult ([place]) 08/08/2018  . Onychomycosis 04/06/2018  . Decreased GFR 01/05/2018  . Healthcare maintenance 09/08/2017  . Lipoma of left upper extremity 08/09/2017  . COPD mixed type ([place]) 06/28/2017  . History of COPD 06/26/2016  . History of hypertension 06/26/2016  . Osteopenia of multiple sites 06/26/2016  . Morbid obesity ([place]) 05/04/2016  . High risk medication use 04/25/2016  . Vocal cord dysfunction 04/29/2015  . Seropositive rheumatoid arthritis of multiple sites ([place]) 04/29/2015  . Hyperlipidemia 10/06/2007  . Essential hypertension 10/06/2007  . OTHER DISEASES OF VOCAL CORDS 10/06/2007  . CERVICAL CANCER, HX OF 10/06/2007     Past Medical History:  Diagnosis Date  .  Asthma   . Cervical cancer ([place])   . Emphysema lung ([place])   . Hyperlipidemia   . Hypertension   . Rheumatoid arthritis(714.0)    [name]     Past Surgical History:  Procedure Laterality Date  . ABDOMINAL HYSTERECTOMY    . BREAST SURGERY    . Breat implants    . LIPOMA EXCISION Left    shoulder   . TUBAL LIGATION    . TUMOR REMOVAL Right    FOREARM     Family History  Problem Relation Age of Onset  . Asthma Mother   . Breast cancer Mother   . Cancer Mother   . Alcohol abuse Father   . Diabetes Son   . Cancer Maternal  Uncle   . Heart attack Maternal Uncle      Social History   Substance and Sexual Activity  Drug Use No     Social History   Substance and Sexual Activity  Alcohol Use Yes   Comment: OCCASSIONAL GLASS OF WINE 1-2 MONTHLY     Social History   Tobacco Use  Smoking Status Former Smoker  . Packs/day: 1.00  . Years: 30.00  . Pack years: 30.00  . Types: Cigarettes  . Last attempt to quit: 06/22/1985  . Years since quitting: 33.1  Smokeless Tobacco Never Used     Outpatient Encounter Medications as of 08/08/2018  Medication Sig  . albuterol (PROVENTIL) (2.5 MG/3ML) 0.083% nebulizer solution USE ONE VIAL IN NEBULIZER EVERY 6 HOURS AS NEEDED  . albuterol (VENTOLIN HFA) 108 (90 Base) MCG/ACT inhaler Inhale 1-2 puffs into the lungs every 6 (six) hours as needed for wheezing or shortness of breath.  . Biotin 5000 MCG CAPS Take 10,000 mcg by mouth daily.  . budesonide-formoterol (SYMBICORT) 160-4.5 MCG/ACT inhaler INHALE TWO PUFFS BY  MOUTH ONCE TO TWICE DAILY  . calcium gluconate 500 MG tablet Take 500 mg by mouth daily.    . Cholecalciferol (VITAMIN D3) 1000 units CAPS Take 1 capsule by mouth daily.   . Chromium 1 MG CAPS Take 1 mg by mouth daily. Increase to 2 caps if needed  . ciclopirox (LOPROX) 0.77 % cream Apply topically 2 (two) times daily.  . Coenzyme Q10 (COQ-10) 100 MG CAPS Take 1 capsule by mouth daily.    . fish oil-omega-3 fatty acids 1000 MG capsule Take 2 capsules by mouth daily.    . folic acid (FOLVITE) 1 MG tablet TAKE 1 TABLET BY MOUTH ONCE DAILY  . hydrochlorothiazide (MICROZIDE) 12.5 MG capsule Take 1 capsule (12.5 mg total) by mouth daily.  Marland Kitchen losartan (COZAAR) 25 MG tablet 2 tabs daily to equal 50mg  once daily dose  . LUTEIN PO Take by mouth daily.  . Methotrexate Sodium (METHOTREXATE, PF,) 50 MG/2ML injection INJECT 0.3ML ONCE A WEEK  . Methylsulfonylmethane (MSM PO) Take by mouth daily.  . Milk Thistle 200 MG CAPS Take 400 mg by mouth daily.    . Multiple  Vitamin (MULTIVITAMIN) capsule Take 1 capsule by mouth daily.    . NON FORMULARY Take 1 capsule by mouth daily. Collagen capsule  . vitamin E (VITAMIN E) 400 UNIT capsule Take 400 Units by mouth daily.   No facility-administered encounter medications on file as of 08/08/2018.     Allergies: Other and Theophylline  Body mass index is 42.27 kg/m.  Blood pressure (!) 160/75, pulse 88, temperature 98.3 F (36.8 C), temperature source Oral, height 5\' 7"  (1.702 m), weight 269 lb 14.4 oz (122.4 kg), SpO2  95 %.  Review of Systems  Constitutional: Positive for fatigue. Negative for activity change, appetite change, chills, diaphoresis, fever and unexpected weight change.  HENT: Negative for congestion.   Respiratory: Negative for cough, chest tightness, shortness of breath, wheezing and stridor.   Cardiovascular: Negative for chest pain, palpitations and leg swelling.  Gastrointestinal: Negative for abdominal distention, anal bleeding, blood in stool, constipation, diarrhea, nausea, rectal pain and vomiting.  Genitourinary: Negative for difficulty urinating and enuresis.  Musculoskeletal: Positive for arthralgias, back pain and myalgias.  Skin: Positive for color change.  Neurological: Negative for dizziness and headaches.  Hematological: Does not bruise/bleed easily.  Psychiatric/Behavioral: Negative for behavioral problems, confusion, decreased concentration, dysphoric mood, hallucinations, self-injury, sleep disturbance and suicidal ideas. The patient is not nervous/anxious and is not hyperactive.        Objective:   Physical Exam Vitals signs and nursing note reviewed.  Constitutional:      General: She is not in acute distress.    Appearance: She is well-developed. She is obese. She is not ill-appearing, toxic-appearing or diaphoretic.  HENT:     Head: Normocephalic and atraumatic.     Comments: Hoarse voice- her baseline    Right Ear: External ear normal. Decreased hearing noted.      Left Ear: External ear normal. Decreased hearing noted.     Nose: Nose normal.  Eyes:     Conjunctiva/sclera: Conjunctivae normal.     Pupils: Pupils are equal, round, and reactive to light.  Cardiovascular:     Rate and Rhythm: Normal rate and regular rhythm.     Heart sounds: Normal heart sounds. No murmur.  Pulmonary:     Effort: Pulmonary effort is normal. No respiratory distress.     Breath sounds: Normal breath sounds. No stridor. No wheezing or rales.  Chest:     Chest wall: No tenderness.  Skin:    General: Skin is warm and dry.     Capillary Refill: Capillary refill takes less than 2 seconds.     Coloration: Skin is not pale.  Neurological:     Mental Status: She is alert and oriented to person, place, and time.  Psychiatric:        Behavior: Behavior normal.        Thought Content: Thought content normal.        Judgment: Judgment normal.       Assessment & Plan:   1. BMI 40.0-44.9, adult ([place])   2. Healthcare maintenance   3. Essential hypertension   4. Decreased GFR   5. Morbid obesity ([place])   6. Vocal cord dysfunction     Healthcare maintenance  Please continue all medications as directed. Both blood pressures in clinic are above goal. Please check your blood pressure and heart rate daily and call clinic in 2 weeks with the readings. Remain well hydrated and follow heart healthy diet. Remain as active as possible, with your known breathing limitations.  Please keep Pulmonology follow-up next month. Please follow-up here in 3 months and come fasting, so we can check your cholesterol level. If your cholesterol level is still significantly at re-check will again recommend starting a statin.  Essential hypertension Both BP checks above goal She reports inconsistently taking Losartan 25mg  - 2 tabs QD Lengthy discussion that anti-hypertensives need to be taken consistently, complimented by heart healthy diet  Instructed to take BP/HR daily and to call  clinic with readings in 2 weeks If still above goal, will add CCB She is  currently on ARB- Losartan 50mg  QD, HCTZ 12.5mg  QD  Decreased GFR CMP 07/2018 GFR 58  BMI 40.0-44.9, adult ([facility]) Body mass index is 42.27 kg/m. Current wt 21 Remain as active as possible and follow heart healthy diet   Morbid obesity ([facility]) Body mass index is 42.27 kg/m. Current wt 269  Vocal cord dysfunction Hoarse voice   Pt was in the office today for 25+ minutes, I spent >75% of time in face to face counseling of patient's various medical conditions and in coordination of care  FOLLOW-UP:  Return in about 3 months (around 11/06/2018) for Regular Follow Up, Fasting Labs.

## 2018-08-03 LAB — CBC WITH DIFFERENTIAL/PLATELET
Absolute Monocytes: 507 cells/uL (ref 200–950)
BASOS PCT: 0.5 %
Basophils Absolute: 39 cells/uL (ref 0–200)
Eosinophils Absolute: 140 cells/uL (ref 15–500)
Eosinophils Relative: 1.8 %
HCT: 42.9 % (ref 35.0–45.0)
HEMOGLOBIN: 14.1 g/dL (ref 11.7–15.5)
Lymphs Abs: 998 cells/uL (ref 850–3900)
MCH: 29.3 pg (ref 27.0–33.0)
MCHC: 32.9 g/dL (ref 32.0–36.0)
MCV: 89 fL (ref 80.0–100.0)
MPV: 10.4 fL (ref 7.5–12.5)
Monocytes Relative: 6.5 %
Neutro Abs: 6115 cells/uL (ref 1500–7800)
Neutrophils Relative %: 78.4 %
Platelets: 257 10*3/uL (ref 140–400)
RBC: 4.82 10*6/uL (ref 3.80–5.10)
RDW: 13.7 % (ref 11.0–15.0)
Total Lymphocyte: 12.8 %
WBC: 7.8 10*3/uL (ref 3.8–10.8)

## 2018-08-03 LAB — COMPLETE METABOLIC PANEL WITH GFR
AG RATIO: 1.6 (calc) (ref 1.0–2.5)
ALT: 15 U/L (ref 6–29)
AST: 17 U/L (ref 10–35)
Albumin: 4.1 g/dL (ref 3.6–5.1)
Alkaline phosphatase (APISO): 59 U/L (ref 37–153)
BILIRUBIN TOTAL: 0.5 mg/dL (ref 0.2–1.2)
BUN/Creatinine Ratio: 18 (calc) (ref 6–22)
BUN: 17 mg/dL (ref 7–25)
CALCIUM: 9.5 mg/dL (ref 8.6–10.4)
CHLORIDE: 104 mmol/L (ref 98–110)
CO2: 29 mmol/L (ref 20–32)
Creat: 0.95 mg/dL — ABNORMAL HIGH (ref 0.60–0.93)
GFR, EST NON AFRICAN AMERICAN: 58 mL/min/{1.73_m2} — AB (ref >=60)
GFR, Est African American: 67 mL/min/{1.73_m2} (ref >=60)
GLUCOSE: 141 mg/dL — AB (ref 65–99)
Globulin: 2.6 g/dL (calc) (ref 1.9–3.7)
POTASSIUM: 4.8 mmol/L (ref 3.5–5.3)
Sodium: 141 mmol/L (ref 135–146)
Total Protein: 6.7 g/dL (ref 6.1–8.1)

## 2018-08-08 ENCOUNTER — Encounter: Payer: Self-pay | Admitting: Adult Health

## 2018-08-08 ENCOUNTER — Ambulatory Visit (INDEPENDENT_AMBULATORY_CARE_PROVIDER_SITE_OTHER): Payer: Medicare HMO | Admitting: Adult Health

## 2018-08-08 VITALS — BP 160/75 | HR 88 | Temp 98.3°F | Ht 67.0 in | Wt 269.9 lb

## 2018-08-08 DIAGNOSIS — I10 Essential (primary) hypertension: Secondary | ICD-10-CM

## 2018-08-08 DIAGNOSIS — J383 Other diseases of vocal cords: Secondary | ICD-10-CM | POA: Diagnosis not present

## 2018-08-08 DIAGNOSIS — Z6841 Body Mass Index (BMI) 40.0 and over, adult: Secondary | ICD-10-CM | POA: Diagnosis not present

## 2018-08-08 DIAGNOSIS — Z0000 Encounter for general adult medical examination without abnormal findings: Secondary | ICD-10-CM

## 2018-08-08 DIAGNOSIS — R944 Abnormal results of kidney function studies: Secondary | ICD-10-CM

## 2018-08-08 NOTE — Assessment & Plan Note (Signed)
Hoarse voice

## 2018-08-08 NOTE — Assessment & Plan Note (Signed)
Both BP checks above goal She reports inconsistently taking Losartan 25mg  - 2 tabs QD Lengthy discussion that anti-hypertensives need to be taken consistently, complimented by heart healthy diet  Instructed to take BP/HR daily and to call clinic with readings in 2 weeks If still above goal, will add CCB She is currently on ARB- Losartan 50mg  QD, HCTZ 12.5mg  QD

## 2018-08-08 NOTE — Assessment & Plan Note (Signed)
Body mass index is 42.27 kg/m. Current wt 269 Remain as active as possible and follow heart healthy diet

## 2018-08-08 NOTE — Assessment & Plan Note (Signed)
  Please continue all medications as directed. Both blood pressures in clinic are above goal. Please check your blood pressure and heart rate daily and call clinic in 2 weeks with the readings. Remain well hydrated and follow heart healthy diet. Remain as active as possible, with your known breathing limitations.  Please keep Pulmonology follow-up next month. Please follow-up here in 3 months and come fasting, so we can check your cholesterol level. If your cholesterol level is still significantly at re-check will again recommend starting a statin.

## 2018-08-08 NOTE — Assessment & Plan Note (Signed)
>>  ASSESSMENT AND PLAN FOR ESSENTIAL HYPERTENSION WRITTEN ON 08/08/2018  3:49 PM BY DANFORD, KATY D, NP  Both BP checks above goal She reports inconsistently taking Losartan 25mg  - 2 tabs QD Lengthy discussion that anti-hypertensives need to be taken consistently, complimented by heart healthy diet  Instructed to take BP/HR daily and to call clinic with readings in 2 weeks If still above goal, will add CCB She is currently on ARB- Losartan 50mg  QD, HCTZ 12.5mg  QD

## 2018-08-08 NOTE — Assessment & Plan Note (Addendum)
Body mass index is 42.27 kg/m. Current wt 269

## 2018-08-08 NOTE — Assessment & Plan Note (Signed)
CMP 07/2018 GFR 58

## 2018-08-08 NOTE — Patient Instructions (Addendum)
Managing Your Hypertension Hypertension is commonly called high blood pressure. This is when the force of your blood pressing against the walls of your arteries is too strong. Arteries are blood vessels that carry blood from your heart throughout your body. Hypertension forces the heart to work harder to pump blood, and may cause the arteries to become narrow or stiff. Having untreated or uncontrolled hypertension can cause heart attack, stroke, kidney disease, and other problems. What are blood pressure readings? A blood pressure reading consists of a higher number over a lower number. Ideally, your blood pressure should be below 120/80. The first ("top") number is called the systolic pressure. It is a measure of the pressure in your arteries as your heart beats. The second ("bottom") number is called the diastolic pressure. It is a measure of the pressure in your arteries as the heart relaxes. What does my blood pressure reading mean? Blood pressure is classified into four stages. Based on your blood pressure reading, your health care provider may use the following stages to determine what type of treatment you need, if any. Systolic pressure and diastolic pressure are measured in a unit called mm Hg. Normal  Systolic pressure: below 120.  Diastolic pressure: below 80. Elevated  Systolic pressure: 120-129.  Diastolic pressure: below 80. Hypertension stage 1  Systolic pressure: 130-139.  Diastolic pressure: 80-89. Hypertension stage 2  Systolic pressure: 140 or above.  Diastolic pressure: 90 or above. What health risks are associated with hypertension? Managing your hypertension is an important responsibility. Uncontrolled hypertension can lead to:  A heart attack.  A stroke.  A weakened blood vessel (aneurysm).  Heart failure.  Kidney damage.  Eye damage.  Metabolic syndrome.  Memory and concentration problems. What changes can I make to manage my  hypertension? Hypertension can be managed by making lifestyle changes and possibly by taking medicines. Your health care provider will help you make a plan to bring your blood pressure within a normal range. Eating and drinking   Eat a diet that is high in fiber and potassium, and low in salt (sodium), added sugar, and fat. An example eating plan is called the DASH (Dietary Approaches to Stop Hypertension) diet. To eat this way: ? Eat plenty of fresh fruits and vegetables. Try to fill half of your plate at each meal with fruits and vegetables. ? Eat whole grains, such as whole wheat pasta, brown rice, or whole grain bread. Fill about one quarter of your plate with whole grains. ? Eat low-fat diary products. ? Avoid fatty cuts of meat, processed or cured meats, and poultry with skin. Fill about one quarter of your plate with lean proteins such as fish, chicken without skin, beans, eggs, and tofu. ? Avoid premade and processed foods. These tend to be higher in sodium, added sugar, and fat.  Reduce your daily sodium intake. Most people with hypertension should eat less than 1,500 mg of sodium a day.  Limit alcohol intake to no more than 1 drink a day for nonpregnant women and 2 drinks a day for men. One drink equals 12 oz of beer, 5 oz of wine, or 1 oz of hard liquor. Lifestyle  Work with your health care provider to maintain a healthy body weight, or to lose weight. Ask what an ideal weight is for you.  Get at least 30 minutes of exercise that causes your heart to beat faster (aerobic exercise) most days of the week. Activities may include walking, swimming, or biking.  Include exercise   to strengthen your muscles (resistance exercise), such as weight lifting, as part of your weekly exercise routine. Try to do these types of exercises for 30 minutes at least 3 days a week.  Do not use any products that contain nicotine or tobacco, such as cigarettes and e-cigarettes. If you need help quitting,  ask your health care provider.  Control any long-term (chronic) conditions you have, such as high cholesterol or diabetes. Monitoring  Monitor your blood pressure at home as told by your health care provider. Your personal target blood pressure may vary depending on your medical conditions, your age, and other factors.  Have your blood pressure checked regularly, as often as told by your health care provider. Working with your health care provider  Review all the medicines you take with your health care provider because there may be side effects or interactions.  Talk with your health care provider about your diet, exercise habits, and other lifestyle factors that may be contributing to hypertension.  Visit your health care provider regularly. Your health care provider can help you create and adjust your plan for managing hypertension. Will I need medicine to control my blood pressure? Your health care provider may prescribe medicine if lifestyle changes are not enough to get your blood pressure under control, and if:  Your systolic blood pressure is 130 or higher.  Your diastolic blood pressure is 80 or higher. Take medicines only as told by your health care provider. Follow the directions carefully. Blood pressure medicines must be taken as prescribed. The medicine does not work as well when you skip doses. Skipping doses also puts you at risk for problems. Contact a health care provider if:  You think you are having a reaction to medicines you have taken.  You have repeated (recurrent) headaches.  You feel dizzy.  You have swelling in your ankles.  You have trouble with your vision. Get help right away if:  You develop a severe headache or confusion.  You have unusual weakness or numbness, or you feel faint.  You have severe pain in your chest or abdomen.  You vomit repeatedly.  You have trouble breathing. Summary  Hypertension is when the force of blood pumping  through your arteries is too strong. If this condition is not controlled, it may put you at risk for serious complications.  Your personal target blood pressure may vary depending on your medical conditions, your age, and other factors. For most people, a normal blood pressure is less than 120/80.  Hypertension is managed by lifestyle changes, medicines, or both. Lifestyle changes include weight loss, eating a healthy, low-sodium diet, exercising more, and limiting alcohol. This information is not intended to replace advice given to you by your health care provider. Make sure you discuss any questions you have with your health care provider. Document Released: 03/02/2012 Document Revised: 05/06/2016 Document Reviewed: 05/06/2016 Elsevier Interactive Patient Education  [employer].  Please continue all medications as directed. Both blood pressures in clinic are above goal. Please check your blood pressure and heart rate daily and call clinic in 2 weeks with the readings. Remain well hydrated and follow heart healthy diet. Remain as active as possible, with your known breathing limitations.  Please keep Pulmonology follow-up next month. Please follow-up here in 3 months and come fasting, so we can check your cholesterol level. If your cholesterol level is still significantly at re-check will again recommend starting a statin. NICE TO SEE YOU!

## 2018-08-29 ENCOUNTER — Telehealth: Payer: Self-pay | Admitting: Adult Health

## 2018-08-29 NOTE — Telephone Encounter (Signed)
Patient was told to call in 2 weeks with her BP readings.  Date----- Time --------  BP----- Heart Rate  2/18  3:30pm 138/73      88             11:00pm 132/73      78  2/19 4pm  115/64     88   10:40pm 116/64     92   2/20    2pm           116/62      100            4:35pm           134/76     86            11pm              142/79       85  2/21    4:15pm           121/66       75            10:30pm         126/66       89  2/22    10 pm             131/74       84   2/23     2:30pm           132/73     86             10 pm              145/76    87  2/24     3pm                 134/68     85    No evening readings  2/25     3:30pm           144/88      79            10:30pm          134/81      87  2/26     3:45pm            131/64     87             11pm                137/73     89  2/27      6pm               127/71       88 No evening readings  2/28        5:30pm        116/62       85                12am             123/72      87  2/29 ( No Readings)   3/1       4pm             141/74        88           11pm              118/60    88  3/2  3:30pm        144/74        85           12am            129/75        75  3/3       3:30pm        141/80       85  No evening reading  3/4       8pm          141/79        85            12:30am      127/63        84  3/5     4pm           125/93         95          11:45pm       143/83       72  3/6      No reading  3/7       3:30 pm     142/77 81            12:30pm      138/79      88  --forwarding to medical asst for review w/ provider.  --glh

## 2018-08-30 NOTE — Telephone Encounter (Signed)
LVM for pt to call to discuss.  T. Wilhelmine Krogstad, CMA  

## 2018-08-30 NOTE — Telephone Encounter (Signed)
Please review and advise.  T. Nelson, CMA 

## 2018-08-30 NOTE — Telephone Encounter (Signed)
Good Morning Tonya, Can you please call Ms. Uram and share- Overall her BP readings are at goal! Please tell her great job taking her medication consistently! Continue current medication regime. Please ask is she is using her home rower? Thanks! Valetta Fuller

## 2018-08-31 NOTE — Telephone Encounter (Signed)
Pt informed.  Pt states that she has not been using rower d/t shoulder pain.  Advised pt to be sure and f/u 10/2018, however to call if BP readings become consistently low or high.  Pt expressed understanding and is agreeable.  Charyl Bigger, CMA

## 2018-09-13 ENCOUNTER — Ambulatory Visit: Payer: Medicare HMO | Admitting: Pulmonary Disease

## 2018-09-21 NOTE — Progress Notes (Signed)
Virtual Visit via Telephone Note  I connected with Tina Hansen on 09/21/18 at  9:30 AM EDT by telephone and verified that I am speaking with the correct person using two identifiers.   I discussed the limitations, risks, security and privacy concerns of performing an evaluation and management service by telephone and the availability of in person appointments. I also discussed with the patient that there may be a patient responsible charge related to this service. The patient expressed understanding and agreed to proceed.  CC: Right wrist pain  History of Present Illness: Patient is a 78 year old female with a past medical history of seropositive rheumatoid arthritis and osteopenia.  She is on MTX 0.45 ml sq once weekly and folic acid 1 mg po daily.  She reports she recently had increased weakness and discomfort in the right wrist joint.  She was having swelling in the right wrist. She states she was carrying a heavy grocery bag, which exacerbated the pain. She states that after increasing her dose of MTX has resolved her symptoms. She denies any other joint pain or joint swelling at this time.   She rates her rheumatoid arthritis a 1/10.   Review of Systems  Constitutional: Positive for malaise/fatigue. Negative for fever.  HENT:       +mouth dryness  Eyes: Negative for photophobia, pain, discharge and redness.  Respiratory: Negative for cough, shortness of breath (hx of COPD) and wheezing.   Cardiovascular: Negative for chest pain and palpitations.  Gastrointestinal: Negative for blood in stool, constipation and diarrhea.  Genitourinary: Negative for dysuria.  Musculoskeletal: Positive for joint pain. Negative for back pain, myalgias and neck pain.  Skin: Negative for rash.  Neurological: Negative for dizziness and headaches.  Psychiatric/Behavioral: Negative for depression. The patient has insomnia. The patient is not nervous/anxious.    Observations/Objective: Physical Exam   Constitutional: She is oriented to person, place, and time.  Neurological: She is alert and oriented to person, place, and time.  Psychiatric: Mood, memory, affect and judgment normal.   Patient reports morning stiffness for 5 minutes.   Patient denies nocturnal pain.  Difficulty dressing/grooming: Denies Difficulty climbing stairs: Denies Difficulty getting out of chair: Denies Difficulty using hands for taps, buttons, cutlery, and/or writing: Denies  Assessment and Plan: Seropositive rheumatoid arthritis of multiple sites (HCC) - +RF, +CCP, h/o elevated sed rate: She recently had a flare in the right wrist joint.  The swelling and pain in the right wrist joint has resolved since increasing her dose of MTX to 0.45 ml sq weekly for the past 2 weeks. She would like to resume injecting MTX 0.3 ml sq once weekly. She will inject MTX 0.4 ml this Sunday and then reduce to 0.3 ml once weekly. We will send in a refill of MTX today.  Her creatinine was elevated-0.95 on 08/02/18.  She was advised to avoid NSAIDs.  We will continue to monitor lab work closely.  She was advised to notify us if she develops increased joint pain or joint swelling once reducing the dose of MTX back to 0.3 ml sq once weekly.  She will follow up in 3 months.  High risk medication use - MTX 0.3 mL subcutaneous every week, folic acid 1 mg by mouth daily.  Creatinine was elevated-0.95 on 08/02/18.  She will continue on low dose MTX due to elevated creatinine.  She was advised to avoid NSAIDs.  We will continue to monitor lab work.  She will return for lab work  in May and every 3 months.   Osteopenia of multiple sites: She continues to take a vitamin D and calcium supplement daily.   Vitamin D deficiency: She takes vitamin D 1,000 units by mouth daily.    Follow Up Instructions: She will follow up in 3 months.  A refill of MTX 0.3 ml will be sent to the pharmacy.    I discussed the assessment and treatment plan with the  patient. The patient was provided an opportunity to ask questions and all were answered. The patient agreed with the plan and demonstrated an understanding of the instructions.   The patient was advised to call back or seek an in-person evaluation if the symptoms worsen or if the condition fails to improve as anticipated.  I provided 25 minutes of non-face-to-face time during this encounter. Bo Merino, MD  Scribed by- Ofilia Neas, PA-C

## 2018-09-23 ENCOUNTER — Telehealth (INDEPENDENT_AMBULATORY_CARE_PROVIDER_SITE_OTHER): Payer: Medicare HMO | Admitting: Rheumatology

## 2018-09-23 ENCOUNTER — Other Ambulatory Visit: Payer: Self-pay | Admitting: Adult Health

## 2018-09-23 ENCOUNTER — Encounter: Payer: Self-pay | Admitting: Rheumatology

## 2018-09-23 DIAGNOSIS — M0579 Rheumatoid arthritis with rheumatoid factor of multiple sites without organ or systems involvement: Secondary | ICD-10-CM

## 2018-09-23 DIAGNOSIS — Z8709 Personal history of other diseases of the respiratory system: Secondary | ICD-10-CM

## 2018-09-23 DIAGNOSIS — Z79899 Other long term (current) drug therapy: Secondary | ICD-10-CM

## 2018-09-23 DIAGNOSIS — M8589 Other specified disorders of bone density and structure, multiple sites: Secondary | ICD-10-CM

## 2018-09-23 DIAGNOSIS — Z8679 Personal history of other diseases of the circulatory system: Secondary | ICD-10-CM

## 2018-09-23 DIAGNOSIS — Z8639 Personal history of other endocrine, nutritional and metabolic disease: Secondary | ICD-10-CM

## 2018-09-23 DIAGNOSIS — E559 Vitamin D deficiency, unspecified: Secondary | ICD-10-CM

## 2018-09-23 DIAGNOSIS — Z8541 Personal history of malignant neoplasm of cervix uteri: Secondary | ICD-10-CM

## 2018-09-23 MED ORDER — METHOTREXATE SODIUM CHEMO INJECTION (PF) 50 MG/2ML: INTRAMUSCULAR | 0 refills | Status: DC

## 2018-09-23 NOTE — Patient Instructions (Signed)
Standing Labs We placed an order today for your standing lab work.    Please come back and get your standing labs in May and every 3 months   We have open lab Monday through Friday from 8:30-11:30 AM and 1:30-4:00 PM  at the office of Dr. Shaili [name].   You may experience shorter wait times on Monday and Friday afternoons. The office is located at [address], [address], [place], [place] [address] No appointment is necessary.   Labs are drawn by [facility].  You may receive a bill from [facility] for your lab work.  If you wish to have your labs drawn at another location, please call the office 24 hours in advance to send orders.  If you have any questions regarding directions or hours of operation,  please call [phone].   Just as a reminder please drink plenty of water prior to coming for your lab work. Thanks!   

## 2018-09-26 ENCOUNTER — Ambulatory Visit: Payer: Medicare HMO | Admitting: Physician Assistant

## 2018-11-07 ENCOUNTER — Ambulatory Visit (INDEPENDENT_AMBULATORY_CARE_PROVIDER_SITE_OTHER): Payer: Medicare HMO | Admitting: Adult Health

## 2018-11-07 ENCOUNTER — Encounter: Payer: Self-pay | Admitting: Adult Health

## 2018-11-07 ENCOUNTER — Other Ambulatory Visit: Payer: Self-pay

## 2018-11-07 VITALS — BP 140/77 | HR 87 | Temp 96.5°F | Ht 67.0 in | Wt 265.2 lb

## 2018-11-07 DIAGNOSIS — I10 Essential (primary) hypertension: Secondary | ICD-10-CM

## 2018-11-07 DIAGNOSIS — R7303 Prediabetes: Secondary | ICD-10-CM | POA: Diagnosis not present

## 2018-11-07 DIAGNOSIS — E1165 Type 2 diabetes mellitus with hyperglycemia: Secondary | ICD-10-CM | POA: Insufficient documentation

## 2018-11-07 DIAGNOSIS — E7800 Pure hypercholesterolemia, unspecified: Secondary | ICD-10-CM | POA: Diagnosis not present

## 2018-11-07 NOTE — Assessment & Plan Note (Signed)
Assessment and Plan: Continue all medications as directed. Increase water intake, strive for at least 75 ounces/day.   Follow Heart Healthy diet Increase regular exercise.  Recommend at least 30 minutes daily, 5 days per week of walking,biking, swimming, YouTube/Pinterest workout videos. Continue with Rheumatologist as directed.           COVID-19 Education: Signs and symptoms of COVID-19 infection were discussed with pt and how to seek care for testing.  The importance of following the Stay at Home order, and when out- Social Distancing and wearing a facial mask were discussed today.  Follow Up Instructions: 6 month OV   I discussed the assessment and treatment plan with the patient. The patient was provided an opportunity to ask questions and all were answered. The patient agreed with the plan and demonstrated an understanding of the instructions.   The patient was advised to call back or seek an in-person evaluation if the symptoms worsen or if the condition fails to improve as anticipated.

## 2018-11-07 NOTE — Assessment & Plan Note (Signed)
The 10-year ASCVD risk score Mikey Bussing DC Brooke Bonito., et al., 2013) is: 30.7%   Values used to calculate the score:     Age: 78 years     Sex: Female     Is Non-Hispanic African American: No     Diabetic: No     Tobacco smoker: No     Systolic Blood Pressure: 712 mmHg     Is BP treated: Yes     HDL Cholesterol: 74 mg/dL     Total Cholesterol: 239 mg/dL  LDL-143 She has previously declined statin therapy Fasting labs ordered

## 2018-11-07 NOTE — Assessment & Plan Note (Signed)
>>  ASSESSMENT AND PLAN FOR PRE-DIABETES WRITTEN ON 11/07/2018  2:22 PM BY DANFORD, KATY D, NP  Assessment and Plan: Continue all medications as directed. Increase water intake, strive for at least 75 ounces/day.   Follow Heart Healthy diet Increase regular exercise.  Recommend at least 30 minutes daily, 5 days per week of walking,biking, swimming, YouTube/Pinterest workout videos. Continue with Rheumatologist as directed.           COVID-19 Education: Signs and symptoms of COVID-19 infection were discussed with pt and how to seek care for testing.  The importance of following the Stay at Home order, and when out- Social Distancing and wearing a facial mask were discussed today.  Follow Up Instructions: 6 month OV   I discussed the assessment and treatment plan with the patient. The patient was provided an opportunity to ask questions and all were answered. The patient agreed with the plan and demonstrated an understanding of the instructions.   The patient was advised to call back or seek an in-person evaluation if the symptoms worsen or if the condition fails to improve as anticipated.

## 2018-11-07 NOTE — Assessment & Plan Note (Signed)
Body mass index is 41.54 kg/m.  Current wt 265 Increase water intake, strive for at least 75 ounces/day.   Follow Heart Healthy diet Increase regular exercise.  Recommend at least 30 minutes daily, 5 days per week of walking,biking, swimming, YouTube/Pinterest workout videos. Continue with Rheumatologist as directed.

## 2018-11-07 NOTE — Progress Notes (Signed)
Virtual Visit via Telephone Note  I connected with Tina Hansen on 11/07/18 at  1:45 PM EDT by telephone and verified that I am speaking with the correct person using two identifiers.  Location: Patient: Home Provider: In Clinic   I discussed the limitations, risks, security and privacy concerns of performing an evaluation and management service by telephone and the availability of in person appointments. I also discussed with the patient that there may be a patient responsible charge related to this service. The patient expressed understanding and agreed to proceed.   History of Present Illness: 08/08/2018 OV: Tina Hansen is here for regular f/u: HTN, Obesity, HLD She reports taking her Losartan 25mg  2 tabs at at different times of the day- she has not taken today and first BP 179/81, HR 88- repeat slightly improved but still above goal She denies denies acute cardiac sx's She report home readings- SBP 130-140, mean in upper 130s DBP 60-80s, mean in 70s She denies CP/palpitations She has baseline dyspnea, denies increase She has Pulmonology appt next month She estimates to drink >40 oz water a day and "just bought an air fryer" in an attempt to try and eat better She denies tobacco/vape/ETOH use  Last CMP 07/2018, GFR 58 The 10-year ASCVD risk score Tina Hansen., et al., 2013) is: 38.2%   Values used to calculate the score:     Age: 78 years     Sex: Female     Is Non-Hispanic African American: No     Diabetic: No     Tobacco smoker: No     Systolic Blood Pressure: 468 mmHg     Is BP treated: Yes     HDL Cholesterol: 74 mg/dL     Total Cholesterol: 239 mg/dL  LDL-143, declined statin therapy Discussed that her risk for CVA/MI is elevated, especially with her uncontrolled BP She states "I will wait and see what my next round of blood work"  Again encouraged heart healthy diet and to increase regular movement/exercise- as much as tolerated with mixed type  COPD  11/07/2018 OV: Tina Hansen calls in today for regular f/u: Obesity, HTN, HLD Current anti-hypertensive therapy- Losartan 25mg x 2 tabs QD, HCTZ 12.5mg  QD She reports home BP: SBP 119-140 DBP 70-82 She denies CP/increase in dyspnea She denies regular cardiovascular exercise, has been performing "house and working in the yard" for activity. She reports limiting CHO/sugar intake, however BMI remains quite high- current wt 265, BMI 41.54 She reports having TeleMedicine appt with Rheumatologist this month- will have labs drawn. She needs lipids and A1c drawn,she states "I can get those done when I have the other labs collected"- orders placed  Lab Results  Component Value Date   HGBA1C 6.1 (H) 04/28/2018   HGBA1C 5.9 (H) 11/04/2017   Observations/Objective: No acute distress noted during the telephone conversation  Assessment and Plan: Continue all medications as directed. Increase water intake, strive for at least 75 ounces/day.   Follow Heart Healthy diet Increase regular exercise.  Recommend at least 30 minutes daily, 5 days per week of walking,biking, swimming, YouTube/Pinterest workout videos. Continue with Rheumatologist as directed.           COVID-19 Education: Signs and symptoms of COVID-19 infection were discussed with pt and how to seek care for testing.  The importance of following the Stay at Home order, and when out- Social Distancing and wearing a facial mask were discussed today.  Follow Up Instructions: 6 month OV   I discussed  the assessment and treatment plan with the patient. The patient was provided an opportunity to ask questions and all were answered. The patient agreed with the plan and demonstrated an understanding of the instructions.   The patient was advised to call back or seek an in-person evaluation if the symptoms worsen or if the condition fails to improve as anticipated.  I provided 15 minutes of non-face-to-face time during this  encounter.   Esaw Grandchild, NP

## 2018-11-15 ENCOUNTER — Other Ambulatory Visit: Payer: Self-pay

## 2018-11-15 DIAGNOSIS — Z79899 Other long term (current) drug therapy: Secondary | ICD-10-CM | POA: Diagnosis not present

## 2018-11-15 DIAGNOSIS — R7303 Prediabetes: Secondary | ICD-10-CM

## 2018-11-15 DIAGNOSIS — E7800 Pure hypercholesterolemia, unspecified: Secondary | ICD-10-CM | POA: Diagnosis not present

## 2018-11-15 NOTE — Addendum Note (Signed)
Addended by: Nena Jordan on: 11/15/2018 03:11 PM   Modules accepted: Orders

## 2018-11-16 LAB — CBC WITH DIFFERENTIAL/PLATELET
Absolute Monocytes: 479 cells/uL (ref 200–950)
Basophils Absolute: 50 cells/uL (ref 0–200)
Basophils Relative: 0.9 %
Eosinophils Absolute: 171 cells/uL (ref 15–500)
Eosinophils Relative: 3.1 %
HCT: 44.5 % (ref 35.0–45.0)
Hemoglobin: 14.2 g/dL (ref 11.7–15.5)
Lymphs Abs: 990 cells/uL (ref 850–3900)
MCH: 28.6 pg (ref 27.0–33.0)
MCHC: 31.9 g/dL — ABNORMAL LOW (ref 32.0–36.0)
MCV: 89.7 fL (ref 80.0–100.0)
MPV: 9.9 fL (ref 7.5–12.5)
Monocytes Relative: 8.7 %
Neutro Abs: 3812 cells/uL (ref 1500–7800)
Neutrophils Relative %: 69.3 %
Platelets: 235 10*3/uL (ref 140–400)
RBC: 4.96 10*6/uL (ref 3.80–5.10)
RDW: 13.6 % (ref 11.0–15.0)
Total Lymphocyte: 18 %
WBC: 5.5 10*3/uL (ref 3.8–10.8)

## 2018-11-16 LAB — COMPLETE METABOLIC PANEL WITH GFR
AG Ratio: 1.4 (calc) (ref 1.0–2.5)
ALT: 18 U/L (ref 6–29)
AST: 18 U/L (ref 10–35)
Albumin: 4 g/dL (ref 3.6–5.1)
Alkaline phosphatase (APISO): 55 U/L (ref 37–153)
BUN: 21 mg/dL (ref 7–25)
CO2: 27 mmol/L (ref 20–32)
Calcium: 9.2 mg/dL (ref 8.6–10.4)
Chloride: 107 mmol/L (ref 98–110)
Creat: 0.9 mg/dL (ref 0.60–0.93)
GFR, Est African American: 71 mL/min/{1.73_m2} (ref >=60)
GFR, Est Non African American: 61 mL/min/{1.73_m2} (ref >=60)
Globulin: 2.8 g/dL (calc) (ref 1.9–3.7)
Glucose, Bld: 131 mg/dL — ABNORMAL HIGH (ref 65–99)
Potassium: 4.4 mmol/L (ref 3.5–5.3)
Sodium: 140 mmol/L (ref 135–146)
Total Bilirubin: 0.6 mg/dL (ref 0.2–1.2)
Total Protein: 6.8 g/dL (ref 6.1–8.1)

## 2018-11-16 LAB — LIPID PANEL
Cholesterol: 239 mg/dL — ABNORMAL HIGH (ref <200)
HDL: 72 mg/dL (ref >=50)
LDL Cholesterol (Calc): 145 mg/dL (calc) — ABNORMAL HIGH
Non-HDL Cholesterol (Calc): 167 mg/dL (calc) — ABNORMAL HIGH (ref <130)
Total CHOL/HDL Ratio: 3.3 (calc) (ref <5.0)
Triglycerides: 104 mg/dL (ref <150)

## 2018-11-16 LAB — HEMOGLOBIN A1C
Hgb A1c MFr Bld: 5.8 % of total Hgb — ABNORMAL HIGH (ref <5.7)
Mean Plasma Glucose: 120 (calc)
eAG (mmol/L): 6.6 (calc)

## 2018-11-16 NOTE — Progress Notes (Signed)
LDL and hemoglobin A1c are elevated.  Please notify patient and forward the results to her PCP.

## 2018-12-12 NOTE — Progress Notes (Deleted)
Office Visit Note  Patient: Tina Hansen             Date of Birth: [birth year]           MRN: [id number]             PCP: Esaw Grandchild, NP Referring: Esaw Grandchild, NP Visit Date: 12/26/2018 Occupation: @GUAROCC @  Subjective:  No chief complaint on file.   Methotrexate 0.3 mL every 7 days and folic acid 1 mg 1 tablet daily.  Most recent CBC/CMP within normal limits on 11/15/2018.  Due for CBC/CMP in August and will monitor every 3 months.  Standing orders placed.  She is up-to-date with her pneumonia vaccines.  Recommend annual flu and Shingrix vaccines as indicated for immunosuppressant therapy.  History of Present Illness: Tina Hansen is a 78 y.o. female ***   Activities of Daily Living:  Patient reports morning stiffness for *** {minute/hour:19697}.   Patient {ACTIONS;DENIES/REPORTS:21021675::"Denies"} nocturnal pain.  Difficulty dressing/grooming: {ACTIONS;DENIES/REPORTS:21021675::"Denies"} Difficulty climbing stairs: {ACTIONS;DENIES/REPORTS:21021675::"Denies"} Difficulty getting out of chair: {ACTIONS;DENIES/REPORTS:21021675::"Denies"} Difficulty using hands for taps, buttons, cutlery, and/or writing: {ACTIONS;DENIES/REPORTS:21021675::"Denies"}  No Rheumatology ROS completed.   PMFS History:  Patient Active Problem List   Diagnosis Date Noted  . Elevated LDL cholesterol level 11/07/2018  . Pre-diabetes 11/07/2018  . BMI 40.0-44.9, adult ([place]) 08/08/2018  . Onychomycosis 04/06/2018  . Decreased GFR 01/05/2018  . Healthcare maintenance 09/08/2017  . Lipoma of left upper extremity 08/09/2017  . COPD mixed type ([place]) 06/28/2017  . History of COPD 06/26/2016  . History of hypertension 06/26/2016  . Osteopenia of multiple sites 06/26/2016  . Morbid obesity ([place]) 05/04/2016  . High risk medication use 04/25/2016  . Vocal cord dysfunction 04/29/2015  . Seropositive rheumatoid arthritis of multiple sites ([place]) 04/29/2015  . Hyperlipidemia 10/06/2007  .  Essential hypertension 10/06/2007  . OTHER DISEASES OF VOCAL CORDS 10/06/2007  . CERVICAL CANCER, HX OF 10/06/2007    Past Medical History:  Diagnosis Date  . Asthma   . Cervical cancer ([place])   . Emphysema lung ([place])   . Hyperlipidemia   . Hypertension   . Rheumatoid arthritis(714.0)    [name]    Family History  Problem Relation Age of Onset  . Asthma Mother   . Breast cancer Mother   . Cancer Mother   . Alcohol abuse Father   . Diabetes Son   . Cancer Maternal Uncle   . Heart attack Maternal Uncle    Past Surgical History:  Procedure Laterality Date  . ABDOMINAL HYSTERECTOMY    . BREAST SURGERY    . Breat implants    . LIPOMA EXCISION Left    shoulder   . TUBAL LIGATION    . TUMOR REMOVAL Right    FOREARM   Social History   Social History Narrative  . Not on file   Immunization History  Administered Date(s) Administered  . Pneumococcal Conjugate-13 04/06/2018  . Pneumococcal Polysaccharide-23 06/27/2007, 03/22/2009     Objective: Vital Signs: There were no vitals taken for this visit.   Physical Exam   Musculoskeletal Exam: ***  CDAI Exam: CDAI Score: - Patient Global: -; Provider Global: - Swollen: -; Tender: - Joint Exam   No joint exam has been documented for this visit   There is currently no information documented on the homunculus. Go to the Rheumatology activity and complete the homunculus joint exam.  Investigation: No additional findings.  Imaging: No results found.  Recent Labs: Lab Results  Component Value  Date   WBC 5.5 11/15/2018   HGB 14.2 11/15/2018   PLT 235 11/15/2018   NA 140 11/15/2018   K 4.4 11/15/2018   CL 107 11/15/2018   CO2 27 11/15/2018   GLUCOSE 131 (H) 11/15/2018   BUN 21 11/15/2018   CREATININE 0.90 11/15/2018   BILITOT 0.6 11/15/2018   ALKPHOS 59 02/08/2018   AST 18 11/15/2018   ALT 18 11/15/2018   PROT 6.8 11/15/2018   ALBUMIN 4.0 12/31/2016   CALCIUM 9.2 11/15/2018   GFRAA 71 11/15/2018     Speciality Comments: No specialty comments available.  Procedures:  No procedures performed Allergies: Other and Theophylline   Assessment / Plan:     Visit Diagnoses: No diagnosis found.   Orders: No orders of the defined types were placed in this encounter.  No orders of the defined types were placed in this encounter.   Face-to-face time spent with patient was *** minutes. Greater than 50% of time was spent in counseling and coordination of care.  Follow-Up Instructions: No follow-ups on file.   Ofilia Neas, PA-C  Note - This record has been created using Dragon software.  Chart creation errors have been sought, but may not always  have been located. Such creation errors do not reflect on  the standard of medical care.

## 2018-12-13 ENCOUNTER — Other Ambulatory Visit: Payer: Self-pay | Admitting: Physician Assistant

## 2018-12-13 ENCOUNTER — Other Ambulatory Visit: Payer: Self-pay | Admitting: Pulmonary Disease

## 2018-12-13 NOTE — Telephone Encounter (Signed)
Last Visit: 09/23/18 Next Visit: 12/26/18 Labs: 11/15/18 elevated glucose.   Okay to refill per Dr. [name]

## 2018-12-26 ENCOUNTER — Ambulatory Visit: Payer: Self-pay | Admitting: Physician Assistant

## 2018-12-27 ENCOUNTER — Ambulatory Visit: Payer: Medicare HMO | Admitting: Pulmonary Disease

## 2018-12-27 ENCOUNTER — Ambulatory Visit (INDEPENDENT_AMBULATORY_CARE_PROVIDER_SITE_OTHER): Payer: Medicare HMO

## 2018-12-27 ENCOUNTER — Other Ambulatory Visit: Payer: Self-pay

## 2018-12-27 ENCOUNTER — Encounter: Payer: Self-pay | Admitting: Pulmonary Disease

## 2018-12-27 VITALS — BP 120/78 | HR 82 | Temp 98.0°F | Ht 67.0 in | Wt 269.8 lb

## 2018-12-27 DIAGNOSIS — J449 Chronic obstructive pulmonary disease, unspecified: Secondary | ICD-10-CM | POA: Diagnosis not present

## 2018-12-27 NOTE — Patient Instructions (Signed)
Glad you are doing well with regard to the breathing Continue Symbicort We will get a chest x-ray today Follow-up in 6 months.

## 2018-12-27 NOTE — Progress Notes (Addendum)
Tina Hansen    [id number]    Jul 01, 1940  Primary Care Physician:Danford, Berna Spare, NP  Referring Physician: Esaw Grandchild, NP [address] [place],  [place] [address]  Chief complaint: Follow-up for COPD  HPI: 78 year old with vocal cord dysfunction, obesity, COPD, rheumatoid arthritis Previously followed with Dr. [name].  Maintained on Symbicort for several years with stable symptoms Has chronic dyspnea on exertion.  Denies any cough, sputum production  Follows with Dr. [name] for seronegative rheumatoid arthritis and is on methotrexate therapy.  Pets: No pets Occupation: Retired Geophysicist/field seismologist Exposures: No known exposures, no mold, hot tub, Jacuzzi Smoking history: 30-pack-year smoker.  Quit in 1987 Travel history: No significant travel history Relevant family history: No significant family history of lung disease  Outpatient Encounter Medications as of 12/27/2018  Medication Sig  . albuterol (PROVENTIL) (2.5 MG/3ML) 0.083% nebulizer solution USE ONE VIAL IN NEBULIZER EVERY 6 HOURS AS NEEDED  . albuterol (VENTOLIN HFA) 108 (90 Base) MCG/ACT inhaler INHALE 1 TO 2 PUFFS BY MOUTH EVERY 6 HOURS AS NEEDED FOR WHEEZING FOR SHORTNESS OF BREATH  . Biotin 5000 MCG CAPS Take 10,000 mcg by mouth daily.  . calcium gluconate 500 MG tablet Take 500 mg by mouth daily.    . Cholecalciferol (VITAMIN D3) 1000 units CAPS Take 1 capsule by mouth daily.   . Chromium 1 MG CAPS Take 1 mg by mouth daily. Increase to 2 caps if needed  . Coenzyme Q10 (COQ-10) 100 MG CAPS Take 1 capsule by mouth daily.    . fish oil-omega-3 fatty acids 1000 MG capsule Take 2 capsules by mouth daily.    . folic acid (FOLVITE) 1 MG tablet TAKE 1 TABLET BY MOUTH ONCE DAILY  . hydrochlorothiazide (MICROZIDE) 12.5 MG capsule Take 1 capsule (12.5 mg total) by mouth daily.  Marland Kitchen losartan (COZAAR) 25 MG tablet TAKE 2 TABLETS BY MOUTH ONCE DAILY TO EQUAL 50MG   . LUTEIN PO Take by mouth daily.  . Methotrexate  Sodium (METHOTREXATE, PF,) 50 MG/2ML injection INJECT 0.3 ML SUBCUTANEOUSLY  ONCE A WEEK  . Methylsulfonylmethane (MSM PO) Take by mouth daily.  . Milk Thistle 200 MG CAPS Take 400 mg by mouth daily.    . Multiple Vitamin (MULTIVITAMIN) capsule Take 1 capsule by mouth daily.    . NON FORMULARY Take 1 capsule by mouth daily. Collagen capsule  . SYMBICORT 160-4.5 MCG/ACT inhaler Inhale 2 puffs by mouth twice daily  . vitamin E (VITAMIN E) 400 UNIT capsule Take 400 Units by mouth daily.   No facility-administered encounter medications on file as of 12/27/2018.     Allergies as of 12/27/2018 - Review Complete 12/27/2018  Allergen Reaction Noted  . Other  04/20/2011  . Theophylline      Past Medical History:  Diagnosis Date  . Asthma   . Cervical cancer ([place])   . Emphysema lung ([place])   . Hyperlipidemia   . Hypertension   . Rheumatoid arthritis(714.0)    Tina Hansen    Past Surgical History:  Procedure Laterality Date  . ABDOMINAL HYSTERECTOMY    . BREAST SURGERY    . Breat implants    . LIPOMA EXCISION Left    shoulder   . TUBAL LIGATION    . TUMOR REMOVAL Right    FOREARM    Family History  Problem Relation Age of Onset  . Asthma Mother   . Breast cancer Mother   . Cancer Mother   . Alcohol  abuse Father   . Diabetes Son   . Cancer Maternal Uncle   . Heart attack Maternal Uncle     Social History   Socioeconomic History  . Marital status: Divorced    Spouse name: Not on file  . Number of children: 1  . Years of education: Not on file  . Highest education level: Not on file  Occupational History  . Occupation: retired  Scientific laboratory technician  . Financial resource strain: Not on file  . Food insecurity    Worry: Not on file    Inability: Not on file  . Transportation needs    Medical: Not on file    Non-medical: Not on file  Tobacco Use  . Smoking status: Former Smoker    Packs/day: 1.00    Years: 30.00    Pack years: 30.00    Types: Cigarettes    Quit date:  06/22/1985    Years since quitting: 33.5  . Smokeless tobacco: Never Used  Substance and Sexual Activity  . Alcohol use: Yes    Comment: OCCASSIONAL GLASS OF WINE 1-2 MONTHLY  . Drug use: No  . Sexual activity: Never  Lifestyle  . Physical activity    Days per week: Not on file    Minutes per session: Not on file  . Stress: Not on file  Relationships  . Social Herbalist on phone: Not on file    Gets together: Not on file    Attends religious service: Not on file    Active member of club or organization: Not on file    Attends meetings of clubs or organizations: Not on file    Relationship status: Not on file  . Intimate partner violence    Fear of current or ex partner: Not on file    Emotionally abused: Not on file    Physically abused: Not on file    Forced sexual activity: Not on file  Other Topics Concern  . Not on file  Social History Narrative  . Not on file   Review of systems: Review of Systems  Constitutional: Negative for fever and chills.  HENT: Negative.   Eyes: Negative for blurred vision.  Respiratory: as per HPI  Cardiovascular: Negative for chest pain and palpitations.  Gastrointestinal: Negative for vomiting, diarrhea, blood per rectum. Genitourinary: Negative for dysuria, urgency, frequency and hematuria.  Musculoskeletal: Negative for myalgias, back pain and joint pain.  Skin: Negative for itching and rash.  Neurological: Negative for dizziness, tremors, focal weakness, seizures and loss of consciousness.  Endo/Heme/Allergies: Negative for environmental allergies.  Psychiatric/Behavioral: Negative for depression, suicidal ideas and hallucinations.  All other systems reviewed and are negative.  Physical Exam: Blood pressure 120/78, pulse 82, temperature 98 F (36.7 C), temperature source Oral, height 5\' 7"  (1.702 m), weight 269 lb 12.8 oz (122.4 kg), SpO2 99 %. Gen:      No acute distress HEENT:  EOMI, sclera anicteric Neck:     No  masses; no thyromegaly Lungs:    Clear to auscultation bilaterally; normal respiratory effort CV:         Regular rate and rhythm; no murmurs Abd:      + bowel sounds; soft, non-tender; no palpable masses, no distension Ext:    No edema; adequate peripheral perfusion Skin:      Warm and dry; no rash Neuro: alert and oriented x 3 Psych: normal mood and affect  Data Reviewed: Imaging: Chest x-ray 06/28/2017- hyperinflated lungs.  No  acute cardiopulmonary abnormality.  I have reviewed the images personally.  PFTs: 05/09/2015 FVC 2 [65%), FEV1 1 [43%], F/F 49 Severe obstruction  Assessment:  Severe COPD Stable on Symbicort, albuterol as needed Encourage weight loss with diet and exercise  Seronegative rheumatoid arthritis No evidence of interstitial lung disease.  Chest x-ray today She would like to avoid CT scan of the chest to limit radiation exposure.  Plan/Recommendations: - Continue Symbicort - Weight loss - Chest x-ray  Marshell Garfinkel MD [facility] 12/27/2018, 1:55 PM  CC: Esaw Grandchild, NP

## 2018-12-30 NOTE — Progress Notes (Signed)
Virtual Visit via Telephone Note  I connected with Tina Hansen on 12/30/18 at 12:30 PM EDT by telephone and verified that I am speaking with the correct person using two identifiers.  Location: Patient: At her home Provider: At the office  This service was conducted via virtual visit.  The patient was located at home. I was located in my office.  Consent was obtained prior to the virtual visit and is aware of possible charges through their insurance for this visit.  The patient is an established patient.  Dr. [name], MD conducted the virtual visit. Office staff helped with scheduling follow up visits after the service was conducted.   I discussed the limitations, risks, security and privacy concerns of performing an evaluation and management service by telephone and the availability of in person appointments. I also discussed with the patient that there may be a patient responsible charge related to this service. The patient expressed understanding and agreed to proceed.  CC: History of Present Illness: Patient is a 78 year old female with a past medical history of seropositive rheumatoid arthritis and osteopenia.  Patient states she has been doing well on methotrexate 0.3 mL subcu weekly.  She denies any joint swelling.  She has some joint stiffness.  Review of Systems  Constitutional: Negative for fever and malaise/fatigue.  Eyes: Negative for photophobia, pain, discharge and redness.  Respiratory: Negative for cough, shortness of breath and wheezing.   Cardiovascular: Negative for chest pain and palpitations.  Gastrointestinal: Negative for blood in stool, constipation and diarrhea.  Genitourinary: Negative for dysuria.  Musculoskeletal: Negative for back pain, joint pain, myalgias and neck pain.  Skin: Negative for rash.  Neurological: Negative for dizziness and headaches.  Psychiatric/Behavioral: Positive for depression. The patient is nervous/anxious and has insomnia.       Observations/Objective: Physical Exam  Constitutional: She is oriented to person, place, and time.  Neurological: She is alert and oriented to person, place, and time.  Psychiatric: Mood, memory, affect and judgment normal.   Patient reports morning stiffness for 0 minute.   Patient denies nocturnal pain.  Difficulty dressing/grooming: Denies Difficulty climbing stairs: Denies Difficulty getting out of chair: Denies Difficulty using hands for taps, buttons, cutlery, and/or writing: Denies   Assessment and Plan: Seropositive rheumatoid arthritis of multiple sites ([place])- +RF, +CCP, h/o elevated sed rate: Patient denies any joint pain or joint swelling.  She has been tolerating methotrexate well.  High risk medication use - MTX 0.3 mL subcutaneous every week, folic acid 1 mg by mouth daily.  Her last labs done in May 2020 was within normal limits.  She will continue current dose of methotrexate.  I have advised her to get labs every 3 months to monitor for drug toxicity.  Her next labs will be due in August.  Osteopenia of multiple sites: She continues to take a vitamin D and calcium supplement daily.  Vitamin D deficiency: She takes vitamin D 1,000 units by mouth daily.  Follow Up Instructions: She will follow up in    I discussed the assessment and treatment plan with the patient. The patient was provided an opportunity to ask questions and all were answered. The patient agreed with the plan and demonstrated an understanding of the instructions.   The patient was advised to call back or seek an in-person evaluation if the symptoms worsen or if the condition fails to improve as anticipated.  I provided 15 minutes of non-face-to-face time during this encounter.   Bo Merino,  MD   

## 2019-01-06 ENCOUNTER — Other Ambulatory Visit: Payer: Self-pay

## 2019-01-06 ENCOUNTER — Telehealth (INDEPENDENT_AMBULATORY_CARE_PROVIDER_SITE_OTHER): Payer: Medicare HMO | Admitting: Rheumatology

## 2019-01-06 DIAGNOSIS — E7800 Pure hypercholesterolemia, unspecified: Secondary | ICD-10-CM

## 2019-01-06 DIAGNOSIS — Z8709 Personal history of other diseases of the respiratory system: Secondary | ICD-10-CM | POA: Diagnosis not present

## 2019-01-06 DIAGNOSIS — R7303 Prediabetes: Secondary | ICD-10-CM

## 2019-01-06 DIAGNOSIS — M8589 Other specified disorders of bone density and structure, multiple sites: Secondary | ICD-10-CM

## 2019-01-06 DIAGNOSIS — Z8679 Personal history of other diseases of the circulatory system: Secondary | ICD-10-CM

## 2019-01-06 DIAGNOSIS — Z79899 Other long term (current) drug therapy: Secondary | ICD-10-CM

## 2019-01-06 DIAGNOSIS — M0579 Rheumatoid arthritis with rheumatoid factor of multiple sites without organ or systems involvement: Secondary | ICD-10-CM | POA: Diagnosis not present

## 2019-01-06 DIAGNOSIS — Z8639 Personal history of other endocrine, nutritional and metabolic disease: Secondary | ICD-10-CM | POA: Diagnosis not present

## 2019-01-06 DIAGNOSIS — E559 Vitamin D deficiency, unspecified: Secondary | ICD-10-CM

## 2019-01-06 DIAGNOSIS — Z8541 Personal history of malignant neoplasm of cervix uteri: Secondary | ICD-10-CM

## 2019-02-11 ENCOUNTER — Other Ambulatory Visit: Payer: Self-pay | Admitting: Pulmonary Disease

## 2019-02-11 ENCOUNTER — Other Ambulatory Visit: Payer: Self-pay | Admitting: Adult Health

## 2019-03-07 ENCOUNTER — Other Ambulatory Visit: Payer: Self-pay

## 2019-03-07 DIAGNOSIS — Z79899 Other long term (current) drug therapy: Secondary | ICD-10-CM

## 2019-03-08 LAB — COMPLETE METABOLIC PANEL WITH GFR
AG Ratio: 1.5 (calc) (ref 1.0–2.5)
ALT: 21 U/L (ref 6–29)
AST: 20 U/L (ref 10–35)
Albumin: 4.1 g/dL (ref 3.6–5.1)
Alkaline phosphatase (APISO): 61 U/L (ref 37–153)
BUN/Creatinine Ratio: 16 (calc) (ref 6–22)
BUN: 15 mg/dL (ref 7–25)
CO2: 27 mmol/L (ref 20–32)
Calcium: 9.5 mg/dL (ref 8.6–10.4)
Chloride: 105 mmol/L (ref 98–110)
Creat: 0.95 mg/dL — ABNORMAL HIGH (ref 0.60–0.93)
GFR, Est African American: 66 mL/min/{1.73_m2} (ref >=60)
GFR, Est Non African American: 57 mL/min/{1.73_m2} — ABNORMAL LOW (ref >=60)
Globulin: 2.7 g/dL (calc) (ref 1.9–3.7)
Glucose, Bld: 140 mg/dL — ABNORMAL HIGH (ref 65–99)
Potassium: 5 mmol/L (ref 3.5–5.3)
Sodium: 143 mmol/L (ref 135–146)
Total Bilirubin: 0.6 mg/dL (ref 0.2–1.2)
Total Protein: 6.8 g/dL (ref 6.1–8.1)

## 2019-03-08 LAB — CBC WITH DIFFERENTIAL/PLATELET
Absolute Monocytes: 533 cells/uL (ref 200–950)
Basophils Absolute: 37 cells/uL (ref 0–200)
Basophils Relative: 0.5 %
Eosinophils Absolute: 178 cells/uL (ref 15–500)
Eosinophils Relative: 2.4 %
HCT: 44.5 % (ref 35.0–45.0)
Hemoglobin: 14.4 g/dL (ref 11.7–15.5)
Lymphs Abs: 1369 cells/uL (ref 850–3900)
MCH: 28.4 pg (ref 27.0–33.0)
MCHC: 32.4 g/dL (ref 32.0–36.0)
MCV: 87.8 fL (ref 80.0–100.0)
MPV: 10.5 fL (ref 7.5–12.5)
Monocytes Relative: 7.2 %
Neutro Abs: 5284 cells/uL (ref 1500–7800)
Neutrophils Relative %: 71.4 %
Platelets: 231 10*3/uL (ref 140–400)
RBC: 5.07 10*6/uL (ref 3.80–5.10)
RDW: 13.8 % (ref 11.0–15.0)
Total Lymphocyte: 18.5 %
WBC: 7.4 10*3/uL (ref 3.8–10.8)

## 2019-03-08 NOTE — Progress Notes (Signed)
Glucose is elevated.  GFR is low and is stable.  Please forward the labs to her PCP.

## 2019-03-13 ENCOUNTER — Telehealth: Payer: Self-pay

## 2019-03-13 NOTE — Telephone Encounter (Signed)
-----   Message from Esaw Grandchild, NP sent at 03/12/2019  9:00 AM EDT ----- Kermit Balo Morning Kenney Houseman, Can you please call Ms. Swanigan and have her schedule fasting lab appt in the next two weeks- lipids and A1c Thanks! Valetta Fuller  ----- Message ----- From: Carole Binning, LPN Sent: [id number]   4:28 PM EDT To: Esaw Grandchild, NP

## 2019-03-13 NOTE — Telephone Encounter (Signed)
Spoke with pt and advised her of need for fasting labs.  Pt transferred to front desk to schedule appt.  Charyl Bigger, CMA

## 2019-03-13 NOTE — Telephone Encounter (Signed)
-----   Message from Esaw Grandchild, NP sent at 03/09/2019  5:33 PM EDT ----- Kermit Balo Evening Kenney Houseman, Can you please call Ms. Milk and ask her to please make fasting lab appt next week. He BG came back quite elevated. Thanks! Valetta Fuller  ----- Message ----- From: Carole Binning, LPN Sent: [id number]   4:28 PM EDT To: Esaw Grandchild, NP

## 2019-03-20 ENCOUNTER — Other Ambulatory Visit: Payer: Medicare HMO

## 2019-03-20 ENCOUNTER — Other Ambulatory Visit: Payer: Self-pay

## 2019-03-20 DIAGNOSIS — Z79899 Other long term (current) drug therapy: Secondary | ICD-10-CM

## 2019-03-20 DIAGNOSIS — Z7689 Persons encountering health services in other specified circumstances: Secondary | ICD-10-CM | POA: Diagnosis not present

## 2019-03-20 DIAGNOSIS — E785 Hyperlipidemia, unspecified: Secondary | ICD-10-CM | POA: Diagnosis not present

## 2019-03-20 DIAGNOSIS — R7303 Prediabetes: Secondary | ICD-10-CM | POA: Diagnosis not present

## 2019-03-20 DIAGNOSIS — E7800 Pure hypercholesterolemia, unspecified: Secondary | ICD-10-CM

## 2019-03-20 DIAGNOSIS — E039 Hypothyroidism, unspecified: Secondary | ICD-10-CM | POA: Diagnosis not present

## 2019-03-20 DIAGNOSIS — Z0000 Encounter for general adult medical examination without abnormal findings: Secondary | ICD-10-CM | POA: Diagnosis not present

## 2019-03-20 DIAGNOSIS — I10 Essential (primary) hypertension: Secondary | ICD-10-CM

## 2019-03-21 ENCOUNTER — Other Ambulatory Visit: Payer: Self-pay | Admitting: Rheumatology

## 2019-03-21 LAB — CBC WITH DIFFERENTIAL/PLATELET
Basophils Absolute: 0.1 10*3/uL (ref 0.0–0.2)
Basos: 1 %
EOS (ABSOLUTE): 0.2 10*3/uL (ref 0.0–0.4)
Eos: 4 %
Hematocrit: 41.4 % (ref 34.0–46.6)
Hemoglobin: 13.9 g/dL (ref 11.1–15.9)
Immature Grans (Abs): 0 10*3/uL (ref 0.0–0.1)
Immature Granulocytes: 0 %
Lymphocytes Absolute: 1.4 10*3/uL (ref 0.7–3.1)
Lymphs: 30 %
MCH: 29.3 pg (ref 26.6–33.0)
MCHC: 33.6 g/dL (ref 31.5–35.7)
MCV: 87 fL (ref 79–97)
Monocytes Absolute: 0.5 10*3/uL (ref 0.1–0.9)
Monocytes: 10 %
Neutrophils Absolute: 2.5 10*3/uL (ref 1.4–7.0)
Neutrophils: 55 %
Platelets: 224 10*3/uL (ref 150–450)
RBC: 4.75 x10E6/uL (ref 3.77–5.28)
RDW: 14.1 % (ref 11.7–15.4)
WBC: 4.7 10*3/uL (ref 3.4–10.8)

## 2019-03-21 LAB — COMPREHENSIVE METABOLIC PANEL
ALT: 19 IU/L (ref 0–32)
AST: 21 IU/L (ref 0–40)
Albumin/Globulin Ratio: 1.6 (ref 1.2–2.2)
Albumin: 3.9 g/dL (ref 3.7–4.7)
Alkaline Phosphatase: 69 IU/L (ref 39–117)
BUN/Creatinine Ratio: 16 (ref 12–28)
BUN: 16 mg/dL (ref 8–27)
Bilirubin Total: 0.5 mg/dL (ref 0.0–1.2)
CO2: 23 mmol/L (ref 20–29)
Calcium: 9.1 mg/dL (ref 8.7–10.3)
Chloride: 103 mmol/L (ref 96–106)
Creatinine, Ser: 0.99 mg/dL (ref 0.57–1.00)
GFR calc Af Amer: 63 mL/min/{1.73_m2} (ref >59)
GFR calc non Af Amer: 55 mL/min/{1.73_m2} — ABNORMAL LOW (ref >59)
Globulin, Total: 2.4 g/dL (ref 1.5–4.5)
Glucose: 130 mg/dL — ABNORMAL HIGH (ref 65–99)
Potassium: 4.6 mmol/L (ref 3.5–5.2)
Sodium: 140 mmol/L (ref 134–144)
Total Protein: 6.3 g/dL (ref 6.0–8.5)

## 2019-03-21 LAB — LIPID PANEL
Chol/HDL Ratio: 3 ratio (ref 0.0–4.4)
Cholesterol, Total: 223 mg/dL — ABNORMAL HIGH (ref 100–199)
HDL: 74 mg/dL (ref >39)
LDL Chol Calc (NIH): 132 mg/dL — ABNORMAL HIGH (ref 0–99)
Triglycerides: 97 mg/dL (ref 0–149)
VLDL Cholesterol Cal: 17 mg/dL (ref 5–40)

## 2019-03-21 LAB — TSH: TSH: 4.56 u[IU]/mL — ABNORMAL HIGH (ref 0.450–4.500)

## 2019-03-21 LAB — HEMOGLOBIN A1C
Est. average glucose Bld gHb Est-mCnc: 131 mg/dL
Hgb A1c MFr Bld: 6.2 % — ABNORMAL HIGH (ref 4.8–5.6)

## 2019-03-21 NOTE — Telephone Encounter (Signed)
Ok to refill 

## 2019-03-21 NOTE — Telephone Encounter (Signed)
Last Visit: 01/06/19 Next visit: 04/11/19 Labs: 03/20/19 Glucose 130, GFR 55  Okay to refill MTX?

## 2019-03-24 ENCOUNTER — Encounter: Payer: Self-pay | Admitting: Adult Health

## 2019-03-24 DIAGNOSIS — I2510 Atherosclerotic heart disease of native coronary artery without angina pectoris: Secondary | ICD-10-CM | POA: Insufficient documentation

## 2019-03-24 DIAGNOSIS — E1159 Type 2 diabetes mellitus with other circulatory complications: Secondary | ICD-10-CM | POA: Insufficient documentation

## 2019-03-28 NOTE — Progress Notes (Signed)
Office Visit Note  Patient: Tina Hansen             Date of Birth: [birth year]           MRN: [id number]             PCP: Esaw Grandchild, NP Referring: Esaw Grandchild, NP Visit Date: 04/11/2019 Occupation: @GUAROCC @  Subjective:  Medication management.    History of Present Illness: Tina Hansen is a 78 y.o. female with a past medical history of seropositive rheumatoid arthritis and osteopenia. She is on methotrexate 0.3 ml subq along with folic acid 1 mg daily.  Denies missing any doses.  Denies any recent infection.  Denies any recent RA flares. She denies any joint pain or swelling. She reports discomfort in her hips after activities such as cleaning.  Activities of Daily Living:  Patient reports morning stiffness for 10 minutes.   Patient Denies nocturnal pain.  Difficulty dressing/grooming: Denies Difficulty climbing stairs: Denies Difficulty getting out of chair: Denies Difficulty using hands for taps, buttons, cutlery, and/or writing: Denies  Review of Systems  Constitutional: Negative for fatigue.  HENT: Positive for mouth dryness. Negative for mouth sores and nose dryness.   Eyes: Negative for itching and dryness.  Respiratory: Positive for shortness of breath and difficulty breathing.        Due to COPD  Cardiovascular: Negative for chest pain and palpitations.  Gastrointestinal: Negative for abdominal pain, blood in stool, constipation and diarrhea.  Endocrine: Negative for increased urination.  Genitourinary: Negative for difficulty urinating and painful urination.  Musculoskeletal: Positive for arthralgias, joint pain and morning stiffness. Negative for joint swelling.  Skin: Negative for rash and redness.  Allergic/Immunologic: Negative for susceptible to infections.  Neurological: Negative for dizziness, light-headedness, numbness, headaches, memory loss and weakness.  Hematological: Positive for bruising/bleeding tendency.   Psychiatric/Behavioral: Positive for sleep disturbance. Negative for depressed mood and confusion. The patient is not nervous/anxious.     PMFS History:  Patient Active Problem List   Diagnosis Date Noted  . Elevated LDL cholesterol level 11/07/2018  . Pre-diabetes 11/07/2018  . BMI 40.0-44.9, adult ([place]) 08/08/2018  . Onychomycosis 04/06/2018  . Decreased GFR 01/05/2018  . Healthcare maintenance 09/08/2017  . Lipoma of left upper extremity 08/09/2017  . COPD mixed type ([place]) 06/28/2017  . History of COPD 06/26/2016  . History of hypertension 06/26/2016  . Osteopenia of multiple sites 06/26/2016  . Morbid obesity ([place]) 05/04/2016  . High risk medication use 04/25/2016  . Vocal cord dysfunction 04/29/2015  . Seropositive rheumatoid arthritis of multiple sites ([place]) 04/29/2015  . Hyperlipidemia 10/06/2007  . Essential hypertension 10/06/2007  . OTHER DISEASES OF VOCAL CORDS 10/06/2007  . CERVICAL CANCER, HX OF 10/06/2007    Past Medical History:  Diagnosis Date  . Asthma   . Cervical cancer ([place])   . Emphysema lung ([place])   . Hyperlipidemia   . Hypertension   . Rheumatoid arthritis(714.0)    Lautaro Koral    Family History  Problem Relation Age of Onset  . Asthma Mother   . Breast cancer Mother   . Cancer Mother   . Alcohol abuse Father   . Diabetes Son   . Cancer Maternal Uncle   . Heart attack Maternal Uncle    Past Surgical History:  Procedure Laterality Date  . ABDOMINAL HYSTERECTOMY    . BREAST SURGERY    . Breat implants    . LIPOMA EXCISION Left    shoulder   .  TUBAL LIGATION    . TUMOR REMOVAL Right    FOREARM   Social History   Social History Narrative  . Not on file   Immunization History  Administered Date(s) Administered  . Pneumococcal Conjugate-13 04/06/2018  . Pneumococcal Polysaccharide-23 06/27/2007, 03/22/2009     Objective: Vital Signs: BP (!) 162/80 (BP Location: Left Wrist, Patient Position: Sitting, Cuff Size: Normal)   Pulse 92    Resp 16   Ht 5\' 7"  (1.702 m)   Wt 266 lb 3.2 oz (120.7 kg)   BMI 41.69 kg/m    Physical Exam Vitals signs and nursing note reviewed.  Constitutional:      Appearance: She is well-developed.  HENT:     Head: Normocephalic and atraumatic.  Eyes:     Conjunctiva/sclera: Conjunctivae normal.  Neck:     Musculoskeletal: Normal range of motion.  Cardiovascular:     Rate and Rhythm: Normal rate and regular rhythm.     Heart sounds: Normal heart sounds.  Pulmonary:     Effort: Pulmonary effort is normal.     Breath sounds: Normal breath sounds.  Abdominal:     General: Bowel sounds are normal.     Palpations: Abdomen is soft.  Lymphadenopathy:     Cervical: No cervical adenopathy.  Skin:    General: Skin is warm and dry.     Capillary Refill: Capillary refill takes less than 2 seconds.  Neurological:     Mental Status: She is alert and oriented to person, place, and time.  Psychiatric:        Behavior: Behavior normal.      Musculoskeletal Exam: C-spine was in good range of motion.  Shoulder joints elbow joints wrist joints in good range of motion.  There was no synovitis over MCP joints.  PIP and DIP thickening was noted.  Hip joints, knee joints, ankles were in good range of motion with no synovitis.  CDAI Exam: CDAI Score: 0.5  Patient Global: 3 mm; Provider Global: 2 mm Swollen: 0 ; Tender: 0  Joint Exam   No joint exam has been documented for this visit   There is currently no information documented on the homunculus. Go to the Rheumatology activity and complete the homunculus joint exam.  Investigation: No additional findings.  Imaging: No results found.  Recent Labs: Lab Results  Component Value Date   WBC 4.7 03/20/2019   HGB 13.9 03/20/2019   PLT 224 03/20/2019   NA 140 03/20/2019   K 4.6 03/20/2019   CL 103 03/20/2019   CO2 23 03/20/2019   GLUCOSE 130 (H) 03/20/2019   BUN 16 03/20/2019   CREATININE 0.99 03/20/2019   BILITOT 0.5 03/20/2019    ALKPHOS 69 03/20/2019   AST 21 03/20/2019   ALT 19 03/20/2019   PROT 6.3 03/20/2019   ALBUMIN 3.9 03/20/2019   CALCIUM 9.1 03/20/2019   GFRAA 63 03/20/2019    Speciality Comments: No specialty comments available.  Procedures:  No procedures performed Allergies: Other and Theophylline   Assessment / Plan:     Visit Diagnoses: Seropositive rheumatoid arthritis of multiple sites ([place]) - +RF, +CCP, h/o elevated sed rate she is doing very well on low-dose methotrexate.  She had no synovitis on examination today.  Her labs have been stable.  High risk medication use - Methotrexate 0.3 ml every 7 days and folic acid 1 mg 1 tablet.  Most recent CBC/CMP within normal limits on 03/20/2019.  Her labs are due in December and then  will monitor every 3 months.  Osteopenia of multiple sites-She is taking a calcium/vitamin D supplement daily.  Her last DEXA on 07/08/2012 ordered by our office showed T-score -1.3 at right femur neck with BMD 0.859.  I will schedule DEXA.  Vitamin D deficiency-he takes supplement.  Other medical problems are listed as follows:  Pre-diabetes  Elevated LDL cholesterol level  CERVICAL CANCER, HX OF  History of COPD  History of hypertension  History of hyperlipidemia  Orders: No orders of the defined types were placed in this encounter.  No orders of the defined types were placed in this encounter.    Follow-Up Instructions: Return in about 5 months (around 09/09/2019) for Rheumatoid arthritis, Osteoarthritis.   Bo Merino, MD  Note - This record has been created using Editor, commissioning.  Chart creation errors have been sought, but may not always  have been located. Such creation errors do not reflect on  the standard of medical care.

## 2019-03-29 LAB — T3: T3, Total: 149 ng/dL (ref 71–180)

## 2019-03-29 LAB — SPECIMEN STATUS REPORT

## 2019-03-29 LAB — T4, FREE: Free T4: 1.21 ng/dL (ref 0.82–1.77)

## 2019-04-11 ENCOUNTER — Other Ambulatory Visit: Payer: Self-pay

## 2019-04-11 ENCOUNTER — Ambulatory Visit: Payer: Medicare HMO | Admitting: Rheumatology

## 2019-04-11 ENCOUNTER — Encounter: Payer: Self-pay | Admitting: Rheumatology

## 2019-04-11 VITALS — BP 162/80 | HR 92 | Resp 16 | Ht 67.0 in | Wt 266.2 lb

## 2019-04-11 DIAGNOSIS — Z8639 Personal history of other endocrine, nutritional and metabolic disease: Secondary | ICD-10-CM | POA: Diagnosis not present

## 2019-04-11 DIAGNOSIS — Z8709 Personal history of other diseases of the respiratory system: Secondary | ICD-10-CM | POA: Diagnosis not present

## 2019-04-11 DIAGNOSIS — R7303 Prediabetes: Secondary | ICD-10-CM | POA: Diagnosis not present

## 2019-04-11 DIAGNOSIS — Z79899 Other long term (current) drug therapy: Secondary | ICD-10-CM | POA: Diagnosis not present

## 2019-04-11 DIAGNOSIS — E7800 Pure hypercholesterolemia, unspecified: Secondary | ICD-10-CM | POA: Diagnosis not present

## 2019-04-11 DIAGNOSIS — M0579 Rheumatoid arthritis with rheumatoid factor of multiple sites without organ or systems involvement: Secondary | ICD-10-CM | POA: Diagnosis not present

## 2019-04-11 DIAGNOSIS — M8589 Other specified disorders of bone density and structure, multiple sites: Secondary | ICD-10-CM | POA: Diagnosis not present

## 2019-04-11 DIAGNOSIS — E559 Vitamin D deficiency, unspecified: Secondary | ICD-10-CM | POA: Diagnosis not present

## 2019-04-11 DIAGNOSIS — Z8541 Personal history of malignant neoplasm of cervix uteri: Secondary | ICD-10-CM

## 2019-04-11 DIAGNOSIS — Z8679 Personal history of other diseases of the circulatory system: Secondary | ICD-10-CM

## 2019-04-11 NOTE — Patient Instructions (Addendum)
Standing Labs We placed an order today for your standing lab work.    Please come back and get your standing labs in December and then every 3 months.  We have open lab daily Monday through Thursday from 8:30-12:30 PM and 1:30-4:30 PM and Friday from 8:30-12:30 PM and 1:30-4:00 PM at the office of Dr. Bo Merino.   You may experience shorter wait times on Monday and Friday afternoons. The office is located at [address], [place], [place], [place] [address] No appointment is necessary.   Labs are drawn by [facility].  You may receive a bill from [facility] for your lab work.  If you wish to have your labs drawn at another location, please call the office 24 hours in advance to send orders.  If you have any questions regarding directions or hours of operation,  please call [phone].   Just as a reminder please drink plenty of water prior to coming for your lab work. Thanks!

## 2019-04-12 NOTE — Progress Notes (Signed)
Received Epic notification that pt has not read MyChart message regarding results.    Esaw Grandchild, NP To  [place] and Delivered  03/24/2019 12:11 PM  Good Morning Ms. Faxon.  Your thyroid stimulating hormone was slightly elevated, however Free T4 and T3 both normal- therefore subclinical hypothyroidism and we will monitor annually.  Your A1c (average of your blood sugar over the last 3 months) continues to increase- 6.2, up from 5.8 four months ago.  Full blown diabetes if >6.5- to prevent this from developing into diabetes, please reduce overall amount of sugar/carbohydrate intake and try to walk at least 15 mins/day.  Your metabolic panel and complete blood counts were both stable.  Your cholesterol panel-  Total-223, above goal of 199  Triglycerides good at 97  HDL (good cholesterol) is excellent at 74  LDL (bad cholesterol) is above goal of 99  The 10-year ASCVD risk score (risk of heart attack and stroke) 26.2%, optimal risk for your is 16.6%  Please continue to work on following a heart healthy diet and increasing regular exercise.  Please schedule a follow-up visit in November.  Sincerely,  Katy    Letter mailed to pt with results.  Charyl Bigger, CMA

## 2019-04-13 ENCOUNTER — Other Ambulatory Visit: Payer: Self-pay | Admitting: Pulmonary Disease

## 2019-04-21 ENCOUNTER — Telehealth: Payer: Self-pay | Admitting: Rheumatology

## 2019-04-21 NOTE — Telephone Encounter (Signed)
Patient left a voicemail checking the status of her referral for a bone density scan.  Patient requested a return call.

## 2019-04-27 NOTE — Telephone Encounter (Signed)
Attempted to contact patient and left message to advise patient order has been faxed to [facility]. Provided patient with number.

## 2019-05-01 ENCOUNTER — Ambulatory Visit: Payer: Medicare HMO | Admitting: Adult Health

## 2019-05-02 ENCOUNTER — Encounter: Payer: Self-pay | Admitting: Rheumatology

## 2019-05-02 DIAGNOSIS — J439 Emphysema, unspecified: Secondary | ICD-10-CM | POA: Diagnosis not present

## 2019-05-02 DIAGNOSIS — M8589 Other specified disorders of bone density and structure, multiple sites: Secondary | ICD-10-CM | POA: Diagnosis not present

## 2019-05-02 DIAGNOSIS — R29890 Loss of height: Secondary | ICD-10-CM | POA: Diagnosis not present

## 2019-05-02 DIAGNOSIS — J45909 Unspecified asthma, uncomplicated: Secondary | ICD-10-CM | POA: Diagnosis not present

## 2019-05-02 DIAGNOSIS — Z90710 Acquired absence of both cervix and uterus: Secondary | ICD-10-CM | POA: Diagnosis not present

## 2019-05-02 DIAGNOSIS — M069 Rheumatoid arthritis, unspecified: Secondary | ICD-10-CM | POA: Diagnosis not present

## 2019-05-05 NOTE — Progress Notes (Signed)
Subjective:    Patient ID: Tina Hansen, female    DOB: 1941-05-30, 78 y.o.   MRN: [id number]  HPI:  08/08/2018 OV: Tina Hansen is here for regular f/u:HTN, Obesity, HLD She reports taking her Losartan 25mg  2 tabs at at different times of the day- she has not taken today and first BP 179/81, HR 88- repeat slightly improved but still above goal She denies denies acute cardiac sx's She report home readings- SBP 130-140, mean in upper 130s DBP 60-80s, mean in 70s She denies CP/palpitations She has baseline dyspnea, denies increase She has Pulmonology appt next month She estimates to drink >40 oz water a day and "just bought an air fryer" in an attempt to try and eat better She denies tobacco/vape/ETOH use  Last CMP 07/2018, GFR 58 The 10-year ASCVD risk score Mikey Bussing DC Jr., et al., 2013) is: 38.2% Values used to calculate the score: Age: 3 years Sex: Female Is Non-Hispanic African American: No Diabetic: No Tobacco smoker: No Systolic Blood Pressure: 0000000 mmHg Is BP treated: Yes HDL Cholesterol: 74 mg/dL Total Cholesterol: 239 mg/dL LDL-143, declined statin therapy Discussed that her risk for CVA/MI is elevated, especially with her uncontrolled BP She states "I will wait and see what my next round of blood work"  Again encouraged heart healthy diet and to increase regular movement/exercise- as much as tolerated with mixed type COPD  11/07/2018 OV: Tina Hansen calls in today for regular f/u: Obesity, HTN, HLD Current anti-hypertensive therapy- Losartan 25mg x 2 tabs QD, HCTZ 12.5mg  QD She reports home BP: SBP 119-140 DBP 70-82 She denies CP/increase in dyspnea She denies regular cardiovascular exercise, has been performing "house and working in the yard" for activity. She reports limiting CHO/sugar intake, however BMI remains quite high- current wt 265, BMI 41.54 She reports having TeleMedicine appt with Rheumatologist this  month- will have labs drawn. She needs lipids and A1c drawn,she states "I can get those done when I have the other labs collected"- orders placed  05/08/2019 OV: Tina Hansen is here for regular f/u: Obesity, HTN, HLD She has not taken her anti-hypertensives today- Losartan 25mg - 2 tabs QD HCTZ 12.5mg  First BP 186/98, HR 85 Repeat 192/96 She denies any cardiac sx's EKG-NSR Poss Anterior infarc, age undetermined No ischemic  She estimates to drink 20-25 oz water/day, 3 cups of coffee. Clonidine 0.1mg  administered   She has gained 6 lbs since May 2020 Current wt 271 Body mass index is 42.57 kg/m.   The 10-year ASCVD risk score Mikey Bussing DC Brooke Bonito., et al., 2013) is: 33.9%   Values used to calculate the score:     Age: 88 years     Sex: Female     Is Non-Hispanic African American: No     Diabetic: No     Tobacco smoker: No     Systolic Blood Pressure: XX123456 mmHg     Is BP treated: Yes     HDL Cholesterol: 74 mg/dL     Total Cholesterol: 223 mg/dL LDL-132, May 2020-145 Discussed at length her risk of stroke and heart attack- she vehemently declined starting a statin   Lab Results  Component Value Date   HGBA1C 6.2 (H) 03/20/2019   HGBA1C 5.8 (H) 11/15/2018   HGBA1C 6.1 (H) 04/28/2018   Patient Care Team    Relationship Specialty Notifications Start End  Mina Marble D, NP PCP - General Family Medicine  09/08/17   Noralee Space, MD Consulting Physician Pulmonary Disease  09/08/17   [name],  Abel Presto, MD Consulting Physician Rheumatology  09/08/17     Patient Active Problem List   Diagnosis Date Noted  . Elevated LDL cholesterol level 11/07/2018  . Pre-diabetes 11/07/2018  . BMI 40.0-44.9, adult ([place]) 08/08/2018  . Onychomycosis 04/06/2018  . Decreased GFR 01/05/2018  . Healthcare maintenance 09/08/2017  . Lipoma of left upper extremity 08/09/2017  . COPD mixed type ([place]) 06/28/2017  . History of COPD 06/26/2016  . History of hypertension 06/26/2016  . Osteopenia of  multiple sites 06/26/2016  . Morbid obesity ([place]) 05/04/2016  . High risk medication use 04/25/2016  . Vocal cord dysfunction 04/29/2015  . Seropositive rheumatoid arthritis of multiple sites ([place]) 04/29/2015  . Hyperlipidemia 10/06/2007  . Essential hypertension 10/06/2007  . OTHER DISEASES OF VOCAL CORDS 10/06/2007  . CERVICAL CANCER, HX OF 10/06/2007     Past Medical History:  Diagnosis Date  . Asthma   . Cervical cancer ([place])   . Emphysema lung ([place])   . Hyperlipidemia   . Hypertension   . Rheumatoid arthritis(714.0)    [name]     Past Surgical History:  Procedure Laterality Date  . ABDOMINAL HYSTERECTOMY    . BREAST SURGERY    . Breat implants    . LIPOMA EXCISION Left    shoulder   . TUBAL LIGATION    . TUMOR REMOVAL Right    FOREARM     Family History  Problem Relation Age of Onset  . Asthma Mother   . Breast cancer Mother   . Cancer Mother   . Alcohol abuse Father   . Diabetes Son   . Cancer Maternal Uncle   . Heart attack Maternal Uncle      Social History   Substance and Sexual Activity  Drug Use No     Social History   Substance and Sexual Activity  Alcohol Use Yes   Comment: OCCASSIONAL GLASS OF WINE 1-2 MONTHLY     Social History   Tobacco Use  Smoking Status Former Smoker  . Packs/day: 1.00  . Years: 30.00  . Pack years: 30.00  . Types: Cigarettes  . Quit date: 06/22/1985  . Years since quitting: 33.8  Smokeless Tobacco Never Used     Outpatient Encounter Medications as of 05/08/2019  Medication Sig  . albuterol (PROVENTIL) (2.5 MG/3ML) 0.083% nebulizer solution USE ONE VIAL IN NEBULIZER EVERY 6 HOURS AS NEEDED  . albuterol (VENTOLIN HFA) 108 (90 Base) MCG/ACT inhaler INHALE 1 TO 2 PUFFS BY MOUTH EVERY 6 HOURS AS NEEDED FOR WHEEZING OR SHORTNESS OF BREATH . APPOINTMENT REQUIRED FOR FUTURE REFILLS  . Ascorbic Acid (VITAMIN C PO) Take by mouth daily.  . Biotin 5000 MCG CAPS Take 10,000 mcg by mouth daily.  . calcium  gluconate 500 MG tablet Take 500 mg by mouth daily.    . Cholecalciferol (VITAMIN D3) 1000 units CAPS Take 1 capsule by mouth daily.   . Chromium 1 MG CAPS Take 1 mg by mouth daily. Increase to 2 caps if needed  . Coenzyme Q10 (COQ-10) 100 MG CAPS Take 1 capsule by mouth daily.    . fish oil-omega-3 fatty acids 1000 MG capsule Take 2 capsules by mouth daily.    . folic acid (FOLVITE) 1 MG tablet TAKE 1 TABLET BY MOUTH ONCE DAILY  . hydrochlorothiazide (MICROZIDE) 12.5 MG capsule Take 1 capsule (12.5 mg total) by mouth daily.  Marland Kitchen losartan (COZAAR) 100 MG tablet Take 2 tablets by mouth once daily  . LUTEIN PO Take by mouth  daily.  . Methotrexate Sodium (METHOTREXATE, PF,) 50 MG/2ML injection INJECT 0.3ML SUBCUTANEOUSLY ONCE A WEEK  . Methylsulfonylmethane (MSM PO) Take by mouth daily.  . Milk Thistle 200 MG CAPS Take 400 mg by mouth daily.    . Multiple Vitamin (MULTIVITAMIN) capsule Take 1 capsule by mouth daily.    . NON FORMULARY Take 1 capsule by mouth daily. Collagen capsule  . SYMBICORT 160-4.5 MCG/ACT inhaler Inhale 2 puffs by mouth twice daily  . vitamin E (VITAMIN E) 400 UNIT capsule Take 400 Units by mouth daily.  . [DISCONTINUED] losartan (COZAAR) 25 MG tablet Take 2 tablets by mouth once daily  . Zoster Vaccine Adjuvanted [facility]) injection Inject 0.5 mLs into the muscle once for 1 dose.  . [EXPIRED] cloNIDine (CATAPRES) tablet 0.1 mg    No facility-administered encounter medications on file as of 05/08/2019.     Allergies: Other and Theophylline  Body mass index is 42.57 kg/m.  Blood pressure 140/84, pulse 75, temperature 98.2 F (36.8 C), temperature source Oral, height 5\' 7"  (1.702 m), weight 271 lb 12.8 oz (123.3 kg), SpO2 94 %.   Review of Systems  Constitutional: Positive for fatigue. Negative for activity change, appetite change, chills, diaphoresis, fever and unexpected weight change.  Eyes: Negative for visual disturbance.  Respiratory: Positive for cough,  shortness of breath and wheezing. Negative for chest tightness.   Cardiovascular: Negative for chest pain, palpitations and leg swelling.  Endocrine: Negative for polydipsia and polyuria.  Musculoskeletal: Positive for arthralgias, back pain and myalgias.  Neurological: Negative for dizziness and headaches.  Hematological: Negative for adenopathy. Does not bruise/bleed easily.       Objective:   Physical Exam Vitals signs and nursing note reviewed.  Constitutional:      General: She is not in acute distress.    Appearance: She is obese. She is not ill-appearing, toxic-appearing or diaphoretic.  HENT:     Head: Normocephalic and atraumatic.  Cardiovascular:     Rate and Rhythm: Normal rate and regular rhythm.     Pulses: Normal pulses.     Heart sounds: Normal heart sounds. No murmur. No friction rub. No gallop.   Pulmonary:     Effort: Pulmonary effort is normal. No respiratory distress.     Breath sounds: No stridor. Examination of the left-middle field reveals wheezing. Examination of the right-lower field reveals decreased breath sounds. Examination of the left-lower field reveals decreased breath sounds. Decreased breath sounds and wheezing present. No rhonchi or rales.  Chest:     Chest wall: No tenderness.  Skin:    General: Skin is warm.     Capillary Refill: Capillary refill takes less than 2 seconds.  Neurological:     Mental Status: She is alert and oriented to person, place, and time.  Psychiatric:        Mood and Affect: Mood normal.        Behavior: Behavior normal.        Thought Content: Thought content normal.        Judgment: Judgment normal.       Assessment & Plan:   1. Need for shingles vaccine   2. Essential hypertension     Essential hypertension Please increase Losartan to 100mg  once daily. Continue current dose of Hydrochlorothiazide. She has not taken her anti-hypertensives today- Losartan 25mg - 2 tabs QD HCTZ 12.5mg  First BP 186/98, HR  85 Repeat 192/96 She denies any cardiac sx's EKG-NSR Poss Anterior infarc, age undetermined No ischemic   Please  increase Losartan to 100mg  once daily. Continue current dose of Hydrochlorothiazide. Follow DASH diet. Due to breathing difficulties, exercise is difficult- really focus on diet to reduce weight. Please check blood pressure and hear rate daily-record - bring to follow-up in 2 weeks. If you develop any chest pain/pressure or significant shortness of breath - call 911.   Pt has been in clinic >60 minutes until BP normalized  FOLLOW-UP:  Return in about 2 weeks (around 05/22/2019) for Regular Follow Up, HTN, Obesity.

## 2019-05-08 ENCOUNTER — Encounter: Payer: Self-pay | Admitting: Adult Health

## 2019-05-08 ENCOUNTER — Other Ambulatory Visit: Payer: Self-pay

## 2019-05-08 ENCOUNTER — Ambulatory Visit (INDEPENDENT_AMBULATORY_CARE_PROVIDER_SITE_OTHER): Payer: Medicare HMO | Admitting: Adult Health

## 2019-05-08 VITALS — BP 140/84 | HR 75 | Temp 98.2°F | Ht 67.0 in | Wt 271.8 lb

## 2019-05-08 DIAGNOSIS — Z23 Encounter for immunization: Secondary | ICD-10-CM

## 2019-05-08 DIAGNOSIS — I10 Essential (primary) hypertension: Secondary | ICD-10-CM

## 2019-05-08 MED ORDER — CLONIDINE HCL 0.1 MG PO TABS
0.1000 mg | ORAL_TABLET | Freq: Once | ORAL | Status: AC
  Administered 2019-05-08: 0.1 mg via ORAL

## 2019-05-08 MED ORDER — SHINGRIX 50 MCG/0.5ML IM SUSR: 0.5000 mL | Freq: Once | INTRAMUSCULAR | 0 refills | Status: AC

## 2019-05-08 MED ORDER — LOSARTAN POTASSIUM 100 MG PO TABS: ORAL_TABLET | ORAL | 0 refills | Status: DC

## 2019-05-08 NOTE — Patient Instructions (Addendum)
DASH Eating Plan DASH stands for "Dietary Approaches to Stop Hypertension." The DASH eating plan is a healthy eating plan that has been shown to reduce high blood pressure (hypertension). It may also reduce your risk for type 2 diabetes, heart disease, and stroke. The DASH eating plan may also help with weight loss. What are tips for following this plan?  General guidelines  Avoid eating more than 2,300 mg (milligrams) of salt (sodium) a day. If you have hypertension, you may need to reduce your sodium intake to 1,500 mg a day.  Limit alcohol intake to no more than 1 drink a day for nonpregnant women and 2 drinks a day for men. One drink equals 12 oz of beer, 5 oz of wine, or 1 oz of hard liquor.  Work with your health care provider to maintain a healthy body weight or to lose weight. Ask what an ideal weight is for you.  Get at least 30 minutes of exercise that causes your heart to beat faster (aerobic exercise) most days of the week. Activities may include walking, swimming, or biking.  Work with your health care provider or diet and nutrition specialist (dietitian) to adjust your eating plan to your individual calorie needs. Reading food labels   Check food labels for the amount of sodium per serving. Choose foods with less than 5 percent of the Daily Value of sodium. Generally, foods with less than 300 mg of sodium per serving fit into this eating plan.  To find whole grains, look for the word "whole" as the first word in the ingredient list. Shopping  Buy products labeled as "low-sodium" or "no salt added."  Buy fresh foods. Avoid canned foods and premade or frozen meals. Cooking  Avoid adding salt when cooking. Use salt-free seasonings or herbs instead of table salt or sea salt. Check with your health care provider or pharmacist before using salt substitutes.  Do not fry foods. Cook foods using healthy methods such as baking, boiling, grilling, and broiling instead.  Cook with  heart-healthy oils, such as olive, canola, soybean, or sunflower oil. Meal planning  Eat a balanced diet that includes: ? 5 or more servings of fruits and vegetables each day. At each meal, try to fill half of your plate with fruits and vegetables. ? Up to 6-8 servings of whole grains each day. ? Less than 6 oz of lean meat, poultry, or fish each day. A 3-oz serving of meat is about the same size as a deck of cards. One egg equals 1 oz. ? 2 servings of low-fat dairy each day. ? A serving of nuts, seeds, or beans 5 times each week. ? Heart-healthy fats. Healthy fats called Omega-3 fatty acids are found in foods such as flaxseeds and coldwater fish, like sardines, salmon, and mackerel.  Limit how much you eat of the following: ? Canned or prepackaged foods. ? Food that is high in trans fat, such as fried foods. ? Food that is high in saturated fat, such as fatty meat. ? Sweets, desserts, sugary drinks, and other foods with added sugar. ? Full-fat dairy products.  Do not salt foods before eating.  Try to eat at least 2 vegetarian meals each week.  Eat more home-cooked food and less restaurant, buffet, and fast food.  When eating at a restaurant, ask that your food be prepared with less salt or no salt, if possible. What foods are recommended? The items listed may not be a complete list. Talk with your dietitian about   what dietary choices are best for you. Grains Whole-grain or whole-wheat bread. Whole-grain or whole-wheat pasta. Brown rice. Oatmeal. Quinoa. Bulgur. Whole-grain and low-sodium cereals. Pita bread. Low-fat, low-sodium crackers. Whole-wheat flour tortillas. Vegetables Fresh or frozen vegetables (raw, steamed, roasted, or grilled). Low-sodium or reduced-sodium tomato and vegetable juice. Low-sodium or reduced-sodium tomato sauce and tomato paste. Low-sodium or reduced-sodium canned vegetables. Fruits All fresh, dried, or frozen fruit. Canned fruit in natural juice (without  added sugar). Meat and other protein foods Skinless chicken or turkey. Ground chicken or turkey. Pork with fat trimmed off. Fish and seafood. Egg whites. Dried beans, peas, or lentils. Unsalted nuts, nut butters, and seeds. Unsalted canned beans. Lean cuts of beef with fat trimmed off. Low-sodium, lean deli meat. Dairy Low-fat (1%) or fat-free (skim) milk. Fat-free, low-fat, or reduced-fat cheeses. Nonfat, low-sodium ricotta or cottage cheese. Low-fat or nonfat yogurt. Low-fat, low-sodium cheese. Fats and oils Soft margarine without trans fats. Vegetable oil. Low-fat, reduced-fat, or light mayonnaise and salad dressings (reduced-sodium). Canola, safflower, olive, soybean, and sunflower oils. Avocado. Seasoning and other foods Herbs. Spices. Seasoning mixes without salt. Unsalted popcorn and pretzels. Fat-free sweets. What foods are not recommended? The items listed may not be a complete list. Talk with your dietitian about what dietary choices are best for you. Grains Baked goods made with fat, such as croissants, muffins, or some breads. Dry pasta or rice meal packs. Vegetables Creamed or fried vegetables. Vegetables in a cheese sauce. Regular canned vegetables (not low-sodium or reduced-sodium). Regular canned tomato sauce and paste (not low-sodium or reduced-sodium). Regular tomato and vegetable juice (not low-sodium or reduced-sodium). Pickles. Olives. Fruits Canned fruit in a light or heavy syrup. Fried fruit. Fruit in cream or butter sauce. Meat and other protein foods Fatty cuts of meat. Ribs. Fried meat. Bacon. Sausage. Bologna and other processed lunch meats. Salami. Fatback. Hotdogs. Bratwurst. Salted nuts and seeds. Canned beans with added salt. Canned or smoked fish. Whole eggs or egg yolks. Chicken or turkey with skin. Dairy Whole or 2% milk, cream, and half-and-half. Whole or full-fat cream cheese. Whole-fat or sweetened yogurt. Full-fat cheese. Nondairy creamers. Whipped toppings.  Processed cheese and cheese spreads. Fats and oils Butter. Stick margarine. Lard. Shortening. Ghee. Bacon fat. Tropical oils, such as coconut, palm kernel, or palm oil. Seasoning and other foods Salted popcorn and pretzels. Onion salt, garlic salt, seasoned salt, table salt, and sea salt. Worcestershire sauce. Tartar sauce. Barbecue sauce. Teriyaki sauce. Soy sauce, including reduced-sodium. Steak sauce. Canned and packaged gravies. Fish sauce. Oyster sauce. Cocktail sauce. Horseradish that you find on the shelf. Ketchup. Mustard. Meat flavorings and tenderizers. Bouillon cubes. Hot sauce and Tabasco sauce. Premade or packaged marinades. Premade or packaged taco seasonings. Relishes. Regular salad dressings. Where to find more information:  National Heart, Lung, and Blood Institute: www.nhlbi.nih.gov  American Heart Association: www.heart.org Summary  The DASH eating plan is a healthy eating plan that has been shown to reduce high blood pressure (hypertension). It may also reduce your risk for type 2 diabetes, heart disease, and stroke.  With the DASH eating plan, you should limit salt (sodium) intake to 2,300 mg a day. If you have hypertension, you may need to reduce your sodium intake to 1,500 mg a day.  When on the DASH eating plan, aim to eat more fresh fruits and vegetables, whole grains, lean proteins, low-fat dairy, and heart-healthy fats.  Work with your health care provider or diet and nutrition specialist (dietitian) to adjust your eating plan to your   individual calorie needs. This information is not intended to replace advice given to you by your health care provider. Make sure you discuss any questions you have with your health care provider. Document Released: 05/28/2011 Document Revised: 05/21/2017 Document Reviewed: 06/01/2016 Elsevier Patient Education  2020 [employer].   Managing Your Hypertension Hypertension is commonly called high blood pressure. This is when the force  of your blood pressing against the walls of your arteries is too strong. Arteries are blood vessels that carry blood from your heart throughout your body. Hypertension forces the heart to work harder to pump blood, and may cause the arteries to become narrow or stiff. Having untreated or uncontrolled hypertension can cause heart attack, stroke, kidney disease, and other problems. What are blood pressure readings? A blood pressure reading consists of a higher number over a lower number. Ideally, your blood pressure should be below 120/80. The first ("top") number is called the systolic pressure. It is a measure of the pressure in your arteries as your heart beats. The second ("bottom") number is called the diastolic pressure. It is a measure of the pressure in your arteries as the heart relaxes. What does my blood pressure reading mean? Blood pressure is classified into four stages. Based on your blood pressure reading, your health care provider may use the following stages to determine what type of treatment you need, if any. Systolic pressure and diastolic pressure are measured in a unit called mm Hg. Normal  Systolic pressure: below 123456.  Diastolic pressure: below 80. Elevated  Systolic pressure: [id number].  Diastolic pressure: below 80. Hypertension stage 1  Systolic pressure: 0000000.  Diastolic pressure: [id number]. Hypertension stage 2  Systolic pressure: [id number] or above.  Diastolic pressure: 90 or above. What health risks are associated with hypertension? Managing your hypertension is an important responsibility. Uncontrolled hypertension can lead to:  A heart attack.  A stroke.  A weakened blood vessel (aneurysm).  Heart failure.  Kidney damage.  Eye damage.  Metabolic syndrome.  Memory and concentration problems. What changes can I make to manage my hypertension? Hypertension can be managed by making lifestyle changes and possibly by taking medicines. Your health care  provider will help you make a plan to bring your blood pressure within a normal range. Eating and drinking   Eat a diet that is high in fiber and potassium, and low in salt (sodium), added sugar, and fat. An example eating plan is called the DASH (Dietary Approaches to Stop Hypertension) diet. To eat this way: ? Eat plenty of fresh fruits and vegetables. Try to fill half of your plate at each meal with fruits and vegetables. ? Eat whole grains, such as whole wheat pasta, brown rice, or whole grain bread. Fill about one quarter of your plate with whole grains. ? Eat low-fat diary products. ? Avoid fatty cuts of meat, processed or cured meats, and poultry with skin. Fill about one quarter of your plate with lean proteins such as fish, chicken without skin, beans, eggs, and tofu. ? Avoid premade and processed foods. These tend to be higher in sodium, added sugar, and fat.  Reduce your daily sodium intake. Most people with hypertension should eat less than 1,500 mg of sodium a day.  Limit alcohol intake to no more than 1 drink a day for nonpregnant women and 2 drinks a day for men. One drink equals 12 oz of beer, 5 oz of wine, or 1 oz of hard liquor. Lifestyle  Work with your health care  provider to maintain a healthy body weight, or to lose weight. Ask what an ideal weight is for you.  Get at least 30 minutes of exercise that causes your heart to beat faster (aerobic exercise) most days of the week. Activities may include walking, swimming, or biking.  Include exercise to strengthen your muscles (resistance exercise), such as weight lifting, as part of your weekly exercise routine. Try to do these types of exercises for 30 minutes at least 3 days a week.  Do not use any products that contain nicotine or tobacco, such as cigarettes and e-cigarettes. If you need help quitting, ask your health care provider.  Control any long-term (chronic) conditions you have, such as high cholesterol or  diabetes. Monitoring  Monitor your blood pressure at home as told by your health care provider. Your personal target blood pressure may vary depending on your medical conditions, your age, and other factors.  Have your blood pressure checked regularly, as often as told by your health care provider. Working with your health care provider  Review all the medicines you take with your health care provider because there may be side effects or interactions.  Talk with your health care provider about your diet, exercise habits, and other lifestyle factors that may be contributing to hypertension.  Visit your health care provider regularly. Your health care provider can help you create and adjust your plan for managing hypertension. Will I need medicine to control my blood pressure? Your health care provider may prescribe medicine if lifestyle changes are not enough to get your blood pressure under control, and if:  Your systolic blood pressure is 130 or higher.  Your diastolic blood pressure is 80 or higher. Take medicines only as told by your health care provider. Follow the directions carefully. Blood pressure medicines must be taken as prescribed. The medicine does not work as well when you skip doses. Skipping doses also puts you at risk for problems. Contact a health care provider if:  You think you are having a reaction to medicines you have taken.  You have repeated (recurrent) headaches.  You feel dizzy.  You have swelling in your ankles.  You have trouble with your vision. Get help right away if:  You develop a severe headache or confusion.  You have unusual weakness or numbness, or you feel faint.  You have severe pain in your chest or abdomen.  You vomit repeatedly.  You have trouble breathing. Summary  Hypertension is when the force of blood pumping through your arteries is too strong. If this condition is not controlled, it may put you at risk for serious  complications.  Your personal target blood pressure may vary depending on your medical conditions, your age, and other factors. For most people, a normal blood pressure is less than 120/80.  Hypertension is managed by lifestyle changes, medicines, or both. Lifestyle changes include weight loss, eating a healthy, low-sodium diet, exercising more, and limiting alcohol. This information is not intended to replace advice given to you by your health care provider. Make sure you discuss any questions you have with your health care provider. Document Released: 03/02/2012 Document Revised: 09/30/2018 Document Reviewed: 05/06/2016 Elsevier Patient Education  [place].  Please increase Losartan to 100mg  once daily. Continue current dose of Hydrochlorothiazide. Follow DASH diet. Due to breathing difficulties, exercise is difficult- really focus on diet to reduce weight. Please check blood pressure and hear rate daily-record - bring to follow-up in 2 weeks. If you develop any  chest pain/pressure or significant shortness of breath - call 911.

## 2019-05-08 NOTE — Assessment & Plan Note (Signed)
>>  ASSESSMENT AND PLAN FOR ESSENTIAL HYPERTENSION WRITTEN ON 05/08/2019  5:52 PM BY DANFORD, KATY D, NP  Please increase Losartan to 100mg  once daily. Continue current dose of Hydrochlorothiazide. She has not taken her anti-hypertensives today- Losartan 25mg - 2 tabs QD HCTZ 12.5mg  First BP 186/98, HR 85 Repeat 192/96 She denies any cardiac sx's EKG-NSR Poss Anterior infarc, age undetermined No ischemic   Please increase Losartan to 100mg  once daily. Continue current dose of Hydrochlorothiazide. Follow DASH diet. Due to breathing difficulties, exercise is difficult- really focus on diet to reduce weight. Please check blood pressure and hear rate daily-record - bring to follow-up in 2 weeks. If you develop any chest pain/pressure or significant shortness of breath - call 911.

## 2019-05-08 NOTE — Assessment & Plan Note (Addendum)
Please increase Losartan to 100mg  once daily. Continue current dose of Hydrochlorothiazide. She has not taken her anti-hypertensives today- Losartan 25mg - 2 tabs QD HCTZ 12.5mg  First BP 186/98, HR 85 Repeat 192/96 She denies any cardiac sx's EKG-NSR Poss Anterior infarc, age undetermined No ischemic   Please increase Losartan to 100mg  once daily. Continue current dose of Hydrochlorothiazide. Follow DASH diet. Due to breathing difficulties, exercise is difficult- really focus on diet to reduce weight. Please check blood pressure and hear rate daily-record - bring to follow-up in 2 weeks. If you develop any chest pain/pressure or significant shortness of breath - call 911.

## 2019-05-09 ENCOUNTER — Telehealth: Payer: Self-pay | Admitting: *Deleted

## 2019-05-09 NOTE — Telephone Encounter (Signed)
Received DEXA results from [facility].  Date of Scan: 05/02/19 Lowest T-score and site measured:-1.7 Right Femoral Neck  Significant changes in BMD and site measured (5% and above): -7% Left Femur  Current Regimen: Taking Calcium and Vitamin D.   Recommendation: Exercise and repeat in 2 years.

## 2019-05-10 ENCOUNTER — Other Ambulatory Visit: Payer: Self-pay | Admitting: Pulmonary Disease

## 2019-05-10 ENCOUNTER — Other Ambulatory Visit: Payer: Self-pay | Admitting: Adult Health

## 2019-05-10 NOTE — Telephone Encounter (Signed)
Patient advised of results and recommendations.  

## 2019-05-21 NOTE — Progress Notes (Signed)
Subjective:    Patient ID: Tina Hansen, female    DOB: [birth year], 78 y.o.   MRN: [id number]  HPI: 05/08/2019 OV: [name] is here for regular f/u: Obesity, HTN, HLD She has not taken her anti-hypertensives today- Losartan 25mg - 2 tabs QD HCTZ 12.5mg  First BP 186/98, HR 85 Repeat 192/96 She denies any cardiac sx's EKG-NSR Poss Anterior infarc, age undetermined No ischemic  She estimates to drink 20-25 oz water/day, 3 cups of coffee. Clonidine 0.1mg  administered   She has gained 6 lbs since May 2020 Current wt 271 Body mass index is 42.57 kg/m.   The 10-year ASCVD risk score Mikey Bussing DC Brooke Bonito., et al., 2013) is: 33.9%   Values used to calculate the score:     Age: 62 years     Sex: Female     Is Non-Hispanic African American: No     Diabetic: No     Tobacco smoker: No     Systolic Blood Pressure: XX123456 mmHg     Is BP treated: Yes     HDL Cholesterol: 74 mg/dL     Total Cholesterol: 223 mg/dL LDL-132, May 2020-145 Discussed at length her risk of stroke and heart attack- she vehemently declined starting a statin   05/22/2019 OV: [name] is here for 2 week f/u: uncontrolled HTN Losartan was increased to 100mg  once daily. Ambulatory BP readings- SBP 110-150, mean low 120s DBP 60-80, mean low 70s HR 70-90s She denies chest pain/pressure with exertion. She denies increase in dyspnea. She has decreased saturated fat, sugar/CHO She has lost 15 lbs since last OV 2 weeks ago Current wt 256 Body mass index is 40.1 kg/m.   Patient Care Team    Relationship Specialty Notifications Start End  Mina Marble D, NP PCP - General Family Medicine  09/08/17   Noralee Space, MD Consulting Physician Pulmonary Disease  09/08/17   Bo Merino, MD Consulting Physician Rheumatology  09/08/17     Patient Active Problem List   Diagnosis Date Noted  . Elevated LDL cholesterol level 11/07/2018  . Pre-diabetes 11/07/2018  . BMI 40.0-44.9, adult ([place]) 08/08/2018  .  Onychomycosis 04/06/2018  . Decreased GFR 01/05/2018  . Healthcare maintenance 09/08/2017  . Lipoma of left upper extremity 08/09/2017  . COPD mixed type ([place]) 06/28/2017  . History of COPD 06/26/2016  . History of hypertension 06/26/2016  . Osteopenia of multiple sites 06/26/2016  . Morbid obesity ([place]) 05/04/2016  . High risk medication use 04/25/2016  . Vocal cord dysfunction 04/29/2015  . Seropositive rheumatoid arthritis of multiple sites ([place]) 04/29/2015  . Hyperlipidemia 10/06/2007  . Essential hypertension 10/06/2007  . OTHER DISEASES OF VOCAL CORDS 10/06/2007  . CERVICAL CANCER, HX OF 10/06/2007     Past Medical History:  Diagnosis Date  . Asthma   . Cervical cancer ([place])   . Emphysema lung ([place])   . Hyperlipidemia   . Hypertension   . Rheumatoid arthritis(714.0)    [name]     Past Surgical History:  Procedure Laterality Date  . ABDOMINAL HYSTERECTOMY    . BREAST SURGERY    . Breat implants    . LIPOMA EXCISION Left    shoulder   . TUBAL LIGATION    . TUMOR REMOVAL Right    FOREARM     Family History  Problem Relation Age of Onset  . Asthma Mother   . Breast cancer Mother   . Cancer Mother   . Alcohol abuse Father   . Diabetes  Son   . Cancer Maternal Uncle   . Heart attack Maternal Uncle      Social History   Substance and Sexual Activity  Drug Use No     Social History   Substance and Sexual Activity  Alcohol Use Yes   Comment: OCCASSIONAL GLASS OF WINE 1-2 MONTHLY     Social History   Tobacco Use  Smoking Status Former Smoker  . Packs/day: 1.00  . Years: 30.00  . Pack years: 30.00  . Types: Cigarettes  . Quit date: 06/22/1985  . Years since quitting: 33.9  Smokeless Tobacco Never Used     Outpatient Encounter Medications as of 05/22/2019  Medication Sig  . albuterol (PROVENTIL) (2.5 MG/3ML) 0.083% nebulizer solution USE ONE VIAL IN NEBULIZER EVERY 6 HOURS AS NEEDED  . albuterol (VENTOLIN HFA) 108 (90 Base) MCG/ACT  inhaler Inhale 1-2 puffs into the lungs every 6 (six) hours as needed for wheezing or shortness of breath.  . Ascorbic Acid (VITAMIN C PO) Take by mouth daily.  . Biotin 5000 MCG CAPS Take 10,000 mcg by mouth daily.  . calcium gluconate 500 MG tablet Take 500 mg by mouth daily.    . Cholecalciferol (VITAMIN D3) 1000 units CAPS Take 1 capsule by mouth daily.   . Chromium 1 MG CAPS Take 1 mg by mouth daily. Increase to 2 caps if needed  . Coenzyme Q10 (COQ-10) 100 MG CAPS Take 1 capsule by mouth daily.    . fish oil-omega-3 fatty acids 1000 MG capsule Take 2 capsules by mouth daily.    . folic acid (FOLVITE) 1 MG tablet TAKE 1 TABLET BY MOUTH ONCE DAILY  . hydrochlorothiazide (MICROZIDE) 12.5 MG capsule Take 1 capsule by mouth once daily  . losartan (COZAAR) 100 MG tablet Take 1 tablet  by mouth once daily  . LUTEIN PO Take by mouth daily.  . Methotrexate Sodium (METHOTREXATE, PF,) 50 MG/2ML injection INJECT 0.3ML SUBCUTANEOUSLY ONCE A WEEK  . Methylsulfonylmethane (MSM PO) Take by mouth daily.  . Milk Thistle 200 MG CAPS Take 400 mg by mouth daily.    . Multiple Vitamin (MULTIVITAMIN) capsule Take 1 capsule by mouth daily.    . NON FORMULARY Take 1 capsule by mouth daily. Collagen capsule  . SYMBICORT 160-4.5 MCG/ACT inhaler Inhale 2 puffs by mouth twice daily  . vitamin E (VITAMIN E) 400 UNIT capsule Take 400 Units by mouth daily.  . [DISCONTINUED] losartan (COZAAR) 100 MG tablet Take 2 tablets by mouth once daily   No facility-administered encounter medications on file as of 05/22/2019.     Allergies: Other and Theophylline  Body mass index is 40.1 kg/m.  Blood pressure 109/69, pulse 96, temperature 98 F (36.7 C), temperature source Oral, height 5\' 7"  (1.702 m), weight 256 lb (116.1 kg), SpO2 96 %.   Review of Systems  Constitutional: Positive for fatigue. Negative for activity change, appetite change, chills, diaphoresis, fever and unexpected weight change.  HENT: Negative for  congestion.   Eyes: Negative for visual disturbance.  Respiratory: Positive for shortness of breath. Negative for cough, chest tightness, wheezing and stridor.   Cardiovascular: Negative for chest pain, palpitations and leg swelling.  Endocrine: Negative for polydipsia, polyphagia and polyuria.  Musculoskeletal: Positive for arthralgias, gait problem and myalgias.  Neurological: Negative for dizziness.  Hematological: Negative for adenopathy. Does not bruise/bleed easily.  Psychiatric/Behavioral: Negative for agitation, behavioral problems, confusion, decreased concentration, dysphoric mood, hallucinations, self-injury and suicidal ideas. The patient is not nervous/anxious and is not  hyperactive.        Objective:   Physical Exam Vitals signs reviewed.  Constitutional:      General: She is not in acute distress.    Appearance: Normal appearance. She is obese. She is not ill-appearing, toxic-appearing or diaphoretic.  HENT:     Head: Normocephalic and atraumatic.  Eyes:     Extraocular Movements: Extraocular movements intact.     Conjunctiva/sclera: Conjunctivae normal.     Pupils: Pupils are equal, round, and reactive to light.  Cardiovascular:     Rate and Rhythm: Normal rate and regular rhythm.     Pulses: Normal pulses.     Heart sounds: Normal heart sounds. No murmur. No friction rub. No gallop.   Pulmonary:     Effort: Pulmonary effort is normal. No respiratory distress.     Breath sounds: Normal breath sounds. No stridor. No wheezing, rhonchi or rales.  Chest:     Chest wall: No tenderness.  Skin:    General: Skin is warm and dry.     Capillary Refill: Capillary refill takes less than 2 seconds.  Neurological:     Mental Status: She is alert and oriented to person, place, and time.  Psychiatric:        Mood and Affect: Mood normal.        Behavior: Behavior normal.        Thought Content: Thought content normal.        Judgment: Judgment normal.        Assessment  & Plan:   1. Elevated random blood glucose level   2. Essential hypertension     Essential hypertension Continue to take Losartan 100mg  once daily. Continue to check blood pressure/heart rate once daily and record. Please call clinic if blood pressure is consistently <100/60 or >140/90. GREAT JOB on the weight loss and your blood pressure readings look much better! Continue heart healthy diet and remain as active as possible. Please schedule non-fasting lab appt in end of December to re-check your A1c (average of your blood glucose over the last 3 months). Continue to social distance and wear a mask when in public.    FOLLOW-UP:  Return in about 3 months (around 08/20/2019) for HTN, Regular Follow Up, Hypercholestermia, Obesity.

## 2019-05-22 ENCOUNTER — Encounter: Payer: Self-pay | Admitting: Adult Health

## 2019-05-22 ENCOUNTER — Other Ambulatory Visit: Payer: Self-pay

## 2019-05-22 ENCOUNTER — Ambulatory Visit (INDEPENDENT_AMBULATORY_CARE_PROVIDER_SITE_OTHER): Payer: Medicare HMO | Admitting: Adult Health

## 2019-05-22 VITALS — BP 109/69 | HR 96 | Temp 98.0°F | Ht 67.0 in | Wt 256.0 lb

## 2019-05-22 DIAGNOSIS — R7309 Other abnormal glucose: Secondary | ICD-10-CM | POA: Diagnosis not present

## 2019-05-22 DIAGNOSIS — I10 Essential (primary) hypertension: Secondary | ICD-10-CM | POA: Diagnosis not present

## 2019-05-22 MED ORDER — LOSARTAN POTASSIUM 100 MG PO TABS: ORAL_TABLET | ORAL | 0 refills | Status: DC

## 2019-05-22 NOTE — Patient Instructions (Addendum)
Managing Your Hypertension Hypertension is commonly called high blood pressure. This is when the force of your blood pressing against the walls of your arteries is too strong. Arteries are blood vessels that carry blood from your heart throughout your body. Hypertension forces the heart to work harder to pump blood, and may cause the arteries to become narrow or stiff. Having untreated or uncontrolled hypertension can cause heart attack, stroke, kidney disease, and other problems. What are blood pressure readings? A blood pressure reading consists of a higher number over a lower number. Ideally, your blood pressure should be below 120/80. The first ("top") number is called the systolic pressure. It is a measure of the pressure in your arteries as your heart beats. The second ("bottom") number is called the diastolic pressure. It is a measure of the pressure in your arteries as the heart relaxes. What does my blood pressure reading mean? Blood pressure is classified into four stages. Based on your blood pressure reading, your health care provider may use the following stages to determine what type of treatment you need, if any. Systolic pressure and diastolic pressure are measured in a unit called mm Hg. Normal  Systolic pressure: below 784.  Diastolic pressure: below 80. Elevated  Systolic pressure: 696-295.  Diastolic pressure: below 80. Hypertension stage 1  Systolic pressure: 284-132.  Diastolic pressure: 44-01. Hypertension stage 2  Systolic pressure: 027 or above.  Diastolic pressure: 90 or above. What health risks are associated with hypertension? Managing your hypertension is an important responsibility. Uncontrolled hypertension can lead to:  A heart attack.  A stroke.  A weakened blood vessel (aneurysm).  Heart failure.  Kidney damage.  Eye damage.  Metabolic syndrome.  Memory and concentration problems. What changes can I make to manage my hypertension?  Hypertension can be managed by making lifestyle changes and possibly by taking medicines. Your health care provider will help you make a plan to bring your blood pressure within a normal range. Eating and drinking   Eat a diet that is high in fiber and potassium, and low in salt (sodium), added sugar, and fat. An example eating plan is called the DASH (Dietary Approaches to Stop Hypertension) diet. To eat this way: ? Eat plenty of fresh fruits and vegetables. Try to fill half of your plate at each meal with fruits and vegetables. ? Eat whole grains, such as whole wheat pasta, brown rice, or whole grain bread. Fill about one quarter of your plate with whole grains. ? Eat low-fat diary products. ? Avoid fatty cuts of meat, processed or cured meats, and poultry with skin. Fill about one quarter of your plate with lean proteins such as fish, chicken without skin, beans, eggs, and tofu. ? Avoid premade and processed foods. These tend to be higher in sodium, added sugar, and fat.  Reduce your daily sodium intake. Most people with hypertension should eat less than 1,500 mg of sodium a day.  Limit alcohol intake to no more than 1 drink a day for nonpregnant women and 2 drinks a day for men. One drink equals 12 oz of beer, 5 oz of wine, or 1 oz of hard liquor. Lifestyle  Work with your health care provider to maintain a healthy body weight, or to lose weight. Ask what an ideal weight is for you.  Get at least 30 minutes of exercise that causes your heart to beat faster (aerobic exercise) most days of the week. Activities may include walking, swimming, or biking.  Include exercise  to strengthen your muscles (resistance exercise), such as weight lifting, as part of your weekly exercise routine. Try to do these types of exercises for 30 minutes at least 3 days a week.  Do not use any products that contain nicotine or tobacco, such as cigarettes and e-cigarettes. If you need help quitting, ask your health  care provider.  Control any long-term (chronic) conditions you have, such as high cholesterol or diabetes. Monitoring  Monitor your blood pressure at home as told by your health care provider. Your personal target blood pressure may vary depending on your medical conditions, your age, and other factors.  Have your blood pressure checked regularly, as often as told by your health care provider. Working with your health care provider  Review all the medicines you take with your health care provider because there may be side effects or interactions.  Talk with your health care provider about your diet, exercise habits, and other lifestyle factors that may be contributing to hypertension.  Visit your health care provider regularly. Your health care provider can help you create and adjust your plan for managing hypertension. Will I need medicine to control my blood pressure? Your health care provider may prescribe medicine if lifestyle changes are not enough to get your blood pressure under control, and if:  Your systolic blood pressure is 130 or higher.  Your diastolic blood pressure is 80 or higher. Take medicines only as told by your health care provider. Follow the directions carefully. Blood pressure medicines must be taken as prescribed. The medicine does not work as well when you skip doses. Skipping doses also puts you at risk for problems. Contact a health care provider if:  You think you are having a reaction to medicines you have taken.  You have repeated (recurrent) headaches.  You feel dizzy.  You have swelling in your ankles.  You have trouble with your vision. Get help right away if:  You develop a severe headache or confusion.  You have unusual weakness or numbness, or you feel faint.  You have severe pain in your chest or abdomen.  You vomit repeatedly.  You have trouble breathing. Summary  Hypertension is when the force of blood pumping through your arteries  is too strong. If this condition is not controlled, it may put you at risk for serious complications.  Your personal target blood pressure may vary depending on your medical conditions, your age, and other factors. For most people, a normal blood pressure is less than 120/80.  Hypertension is managed by lifestyle changes, medicines, or both. Lifestyle changes include weight loss, eating a healthy, low-sodium diet, exercising more, and limiting alcohol. This information is not intended to replace advice given to you by your health care provider. Make sure you discuss any questions you have with your health care provider. Document Released: 03/02/2012 Document Revised: 09/30/2018 Document Reviewed: 05/06/2016 Elsevier Patient Education  2020 [place] to take Losartan 100mg  once daily. Continue to check blood pressure/heart rate once daily and record. Please call clinic if blood pressure is consistently <100/60 or >140/90. GREAT JOB on the weight loss and your blood pressure readings look much better! Continue heart healthy diet and remain as active as possible. Please schedule non-fasting lab appt in end of December to re-check your A1c (average of your blood glucose over the last 3 months). Continue to social distance and wear a mask when in public. GREAT TO SEE YOU! Follow-up 3-4 months office visit.

## 2019-05-22 NOTE — Assessment & Plan Note (Signed)
>>  ASSESSMENT AND PLAN FOR ESSENTIAL HYPERTENSION WRITTEN ON 05/22/2019  3:43 PM BY DANFORD, KATY D, NP  Continue to take Losartan 100mg  once daily. Continue to check blood pressure/heart rate once daily and record. Please call clinic if blood pressure is consistently <100/60 or >140/90. GREAT JOB on the weight loss and your blood pressure readings look much better! Continue heart healthy diet and remain as active as possible. Please schedule non-fasting lab appt in end of December to re-check your A1c (average of your blood glucose over the last 3 months). Continue to social distance and wear a mask when in public.

## 2019-05-22 NOTE — Assessment & Plan Note (Signed)
Continue to take Losartan 100mg  once daily. Continue to check blood pressure/heart rate once daily and record. Please call clinic if blood pressure is consistently <100/60 or >140/90. GREAT JOB on the weight loss and your blood pressure readings look much better! Continue heart healthy diet and remain as active as possible. Please schedule non-fasting lab appt in end of December to re-check your A1c (average of your blood glucose over the last 3 months). Continue to social distance and wear a mask when in public.

## 2019-05-29 ENCOUNTER — Encounter: Payer: Self-pay | Admitting: Pulmonary Disease

## 2019-05-29 ENCOUNTER — Other Ambulatory Visit: Payer: Self-pay

## 2019-05-29 ENCOUNTER — Ambulatory Visit (INDEPENDENT_AMBULATORY_CARE_PROVIDER_SITE_OTHER): Payer: Medicare HMO | Admitting: Pulmonary Disease

## 2019-05-29 DIAGNOSIS — J449 Chronic obstructive pulmonary disease, unspecified: Secondary | ICD-10-CM

## 2019-05-29 NOTE — Progress Notes (Signed)
Virtual Visit via Telephone Note  I connected with Tina Hansen on 05/29/19 at  2:00 PM EST by telephone and verified that I am speaking with the correct person using two identifiers.  Location: Patient: Home Provider: [facility], [place], [place]   I discussed the limitations, risks, security and privacy concerns of performing an evaluation and management service by telephone and the availability of in person appointments. I also discussed with the patient that there may be a patient responsible charge related to this service. The patient expressed understanding and agreed to proceed.  History of Present Illness: Follow-up for COPD  78 year old with vocal cord dysfunction, obesity, COPD, rheumatoid arthritis Previously followed with Dr. [name].  Maintained on Symbicort for several years with stable symptoms   Observations/Objective: Doing well without any new complaints Has chronic dyspnea on exertion Continues on Symbicort for COPD On methotrexate for rheumatoid arthritis  Chest x-ray 12/27/2018-chronic COPD changes with hyperinflation.  No acute process. I have reviewed the images personally.  Assessment and Plan: Severe COPD Stable on Symbicort, albuterol as needed Encourage weight loss with diet and exercise  Seronegative rheumatoid arthritis No evidence of interstitial lung disease. She would like to avoid CT scan of the chest to limit radiation exposure. On methotrexate per rheumatology  Follow Up Instructions: Follow-up in 6 months   I discussed the assessment and treatment plan with the patient. The patient was provided an opportunity to ask questions and all were answered. The patient agreed with the plan and demonstrated an understanding of the instructions.   The patient was advised to call back or seek an in-person evaluation if the symptoms worsen or if the condition fails to improve as anticipated.  I provided 25 minutes of  non-face-to-face time during this encounter.   Marshell Garfinkel MD [facility] 05/29/2019, 2:12 PM

## 2019-06-07 DIAGNOSIS — G3184 Mild cognitive impairment, so stated: Secondary | ICD-10-CM | POA: Diagnosis not present

## 2019-06-07 DIAGNOSIS — Z833 Family history of diabetes mellitus: Secondary | ICD-10-CM | POA: Diagnosis not present

## 2019-06-07 DIAGNOSIS — R609 Edema, unspecified: Secondary | ICD-10-CM | POA: Diagnosis not present

## 2019-06-07 DIAGNOSIS — Z7951 Long term (current) use of inhaled steroids: Secondary | ICD-10-CM | POA: Diagnosis not present

## 2019-06-07 DIAGNOSIS — Z6841 Body Mass Index (BMI) 40.0 and over, adult: Secondary | ICD-10-CM | POA: Diagnosis not present

## 2019-06-07 DIAGNOSIS — M069 Rheumatoid arthritis, unspecified: Secondary | ICD-10-CM | POA: Diagnosis not present

## 2019-06-07 DIAGNOSIS — J439 Emphysema, unspecified: Secondary | ICD-10-CM | POA: Diagnosis not present

## 2019-06-07 DIAGNOSIS — R32 Unspecified urinary incontinence: Secondary | ICD-10-CM | POA: Diagnosis not present

## 2019-06-07 DIAGNOSIS — I10 Essential (primary) hypertension: Secondary | ICD-10-CM | POA: Diagnosis not present

## 2019-06-08 ENCOUNTER — Other Ambulatory Visit: Payer: Self-pay

## 2019-06-08 DIAGNOSIS — Z79899 Other long term (current) drug therapy: Secondary | ICD-10-CM

## 2019-06-08 DIAGNOSIS — R7303 Prediabetes: Secondary | ICD-10-CM

## 2019-06-09 ENCOUNTER — Encounter: Payer: Self-pay | Admitting: Adult Health

## 2019-06-09 LAB — COMPLETE METABOLIC PANEL WITH GFR
AG Ratio: 1.4 (calc) (ref 1.0–2.5)
ALT: 22 U/L (ref 6–29)
AST: 21 U/L (ref 10–35)
Albumin: 4.1 g/dL (ref 3.6–5.1)
Alkaline phosphatase (APISO): 60 U/L (ref 37–153)
BUN/Creatinine Ratio: 20 (calc) (ref 6–22)
BUN: 20 mg/dL (ref 7–25)
CO2: 25 mmol/L (ref 20–32)
Calcium: 9.7 mg/dL (ref 8.6–10.4)
Chloride: 108 mmol/L (ref 98–110)
Creat: 1 mg/dL — ABNORMAL HIGH (ref 0.60–0.93)
GFR, Est African American: 62 mL/min/{1.73_m2} (ref >=60)
GFR, Est Non African American: 54 mL/min/{1.73_m2} — ABNORMAL LOW (ref >=60)
Globulin: 2.9 g/dL (calc) (ref 1.9–3.7)
Glucose, Bld: 153 mg/dL — ABNORMAL HIGH (ref 65–99)
Potassium: 5.3 mmol/L (ref 3.5–5.3)
Sodium: 144 mmol/L (ref 135–146)
Total Bilirubin: 0.6 mg/dL (ref 0.2–1.2)
Total Protein: 7 g/dL (ref 6.1–8.1)

## 2019-06-09 LAB — CBC WITH DIFFERENTIAL/PLATELET
Absolute Monocytes: 508 cells/uL (ref 200–950)
Basophils Absolute: 62 cells/uL (ref 0–200)
Basophils Relative: 1 %
Eosinophils Absolute: 285 cells/uL (ref 15–500)
Eosinophils Relative: 4.6 %
HCT: 43.9 % (ref 35.0–45.0)
Hemoglobin: 14.5 g/dL (ref 11.7–15.5)
Lymphs Abs: 1314 cells/uL (ref 850–3900)
MCH: 29.4 pg (ref 27.0–33.0)
MCHC: 33 g/dL (ref 32.0–36.0)
MCV: 88.9 fL (ref 80.0–100.0)
MPV: 10.8 fL (ref 7.5–12.5)
Monocytes Relative: 8.2 %
Neutro Abs: 4030 cells/uL (ref 1500–7800)
Neutrophils Relative %: 65 %
Platelets: 273 10*3/uL (ref 140–400)
RBC: 4.94 10*6/uL (ref 3.80–5.10)
RDW: 13.9 % (ref 11.0–15.0)
Total Lymphocyte: 21.2 %
WBC: 6.2 10*3/uL (ref 3.8–10.8)

## 2019-06-09 LAB — HEMOGLOBIN A1C
Hgb A1c MFr Bld: 6.3 % of total Hgb — ABNORMAL HIGH (ref <5.7)
Mean Plasma Glucose: 134 (calc)
eAG (mmol/L): 7.4 (calc)

## 2019-06-09 NOTE — Progress Notes (Signed)
Labs are stable.  Glucose is elevated.  GFR is low but is stable.  Please forward labs to her PCP.

## 2019-06-10 ENCOUNTER — Other Ambulatory Visit: Payer: Self-pay | Admitting: Adult Health

## 2019-06-10 ENCOUNTER — Other Ambulatory Visit: Payer: Self-pay | Admitting: Rheumatology

## 2019-06-12 NOTE — Telephone Encounter (Signed)
Last Visit: 04/11/2019 Next Visit: 09/05/2019  Okay to refill per Dr. [name].

## 2019-07-02 ENCOUNTER — Other Ambulatory Visit: Payer: Self-pay | Admitting: Rheumatology

## 2019-07-03 NOTE — Telephone Encounter (Signed)
Last Visit: 04/11/2019  Next Visit: 09/05/2019 Labs: 06/08/2019 Labs are stable. Glucose is elevated. GFR is low but is stable.   Okay to refill per Dr. [name].

## 2019-07-04 NOTE — Progress Notes (Signed)
Received Epic notification that pt has not read MyChart message regarding results.     A1c Results  From  Esaw Grandchild, NP To  [place] and Delivered  06/09/2019 4:17 PM  Good Afternoon Ms. Lingg,  Your A1c has increased to 6.3, up from 6.2 in Sept.  This is considered pre-diabetic and help lower- reduce sugar/carbohydrate intake and increase daily walking.  Recommend checking your A1c every 3 months.  Have a happy holiday season.  Sincerely,  [employer] User Last Read On  Roselee Culver Not Read      Letter mailed to pt.  Charyl Bigger, CMA

## 2019-08-14 ENCOUNTER — Ambulatory Visit: Payer: Medicare HMO | Admitting: Adult Health

## 2019-08-16 ENCOUNTER — Other Ambulatory Visit: Payer: Self-pay | Admitting: Pulmonary Disease

## 2019-08-21 ENCOUNTER — Encounter: Payer: Self-pay | Admitting: Family Medicine

## 2019-08-21 ENCOUNTER — Other Ambulatory Visit: Payer: Self-pay

## 2019-08-21 ENCOUNTER — Ambulatory Visit (INDEPENDENT_AMBULATORY_CARE_PROVIDER_SITE_OTHER): Payer: Medicare HMO | Admitting: Family Medicine

## 2019-08-21 VITALS — BP 139/80 | HR 82 | Ht 66.5 in | Wt 254.0 lb

## 2019-08-21 DIAGNOSIS — Z79899 Other long term (current) drug therapy: Secondary | ICD-10-CM

## 2019-08-21 DIAGNOSIS — J449 Chronic obstructive pulmonary disease, unspecified: Secondary | ICD-10-CM | POA: Diagnosis not present

## 2019-08-21 DIAGNOSIS — Z5320 Procedure and treatment not carried out because of patient's decision for unspecified reasons: Secondary | ICD-10-CM | POA: Insufficient documentation

## 2019-08-21 DIAGNOSIS — I10 Essential (primary) hypertension: Secondary | ICD-10-CM | POA: Diagnosis not present

## 2019-08-21 DIAGNOSIS — Z9119 Patient's noncompliance with other medical treatment and regimen: Secondary | ICD-10-CM | POA: Diagnosis not present

## 2019-08-21 DIAGNOSIS — R634 Abnormal weight loss: Secondary | ICD-10-CM | POA: Diagnosis not present

## 2019-08-21 DIAGNOSIS — R7303 Prediabetes: Secondary | ICD-10-CM

## 2019-08-21 DIAGNOSIS — Z91199 Patient's noncompliance with other medical treatment and regimen due to unspecified reason: Secondary | ICD-10-CM | POA: Insufficient documentation

## 2019-08-21 DIAGNOSIS — Z6841 Body Mass Index (BMI) 40.0 and over, adult: Secondary | ICD-10-CM | POA: Diagnosis not present

## 2019-08-21 DIAGNOSIS — E785 Hyperlipidemia, unspecified: Secondary | ICD-10-CM

## 2019-08-21 NOTE — Progress Notes (Signed)
--> This is my first OV with pt.  Care was recently transferred from [facility] to me.    Telehealth office visit note for Tina Hansen, D.O- at Primary Care at [facility]   I connected with current patient today and verified that I am speaking with the correct person using two identifiers.   . Location of the patient: Home . Location of the provider: Office Only the patient (+/- their family members at pt's discretion) and myself were participating in the encounter - This visit type was conducted due to national recommendations for restrictions regarding the COVID-19 Pandemic (e.g. social distancing) in an effort to limit this patient's exposure and mitigate transmission in our community.  This format is felt to be most appropriate for this patient at this time.   - No physical exam could be performed with this format, beyond that communicated to [place] by the patient/ family members as noted.   - Additionally my office staff/ schedulers discussed with the patient that there may be a monetary charge related to this service, depending on their medical insurance.   The patient expressed understanding, and agreed to proceed.     _________________________________________________________________________________   History of Present Illness: Hypertension and Hyperlipidemia   I, Toni Amend, am serving as scribe for [employer].  - Weight Loss Notes she's been maintaining her weight; says "I was 254 lbs yesterday, 255 lbs today."  Her last weight in office was 256 lbs.  Says "I'm not too interested in losing a lot of weight at my age;" indicates that she's worried about loose skin.  Thinks 279 lbs was her highest weight.  Patient says "I wasn't heavy until 1992, when I went full blown emphysema and asthma."  Reports that at that time, she was in a bad financial situation and couldn't afford doctors; "I couldn't afford anything."  Says that this health event "really really  shut me down."  By the time she had better control over her breathing, she had already put the weight on, because it was difficult to breathe even moving from room to room.  - COPD She continues to follow up with Dr. [name] of pulmonology.  Notes "I'm staying off of oxygen."  Notes she doesn't go out much; "I'm pretty much a home camper."  She picks up her prescriptions, goes to [employer] to get groceries, "and that's about it."  Says she's a bit of a night owl and "sleeps in snatches of naps."  - COVID-19 Vaccination She has not received her COVID-19 vaccine yet.  Notes "not big on any kind of vaccinations or shots, I've never taken the flu shot."  HPI:  Hypertension:  -  Her blood pressure at home has been running: 126-138/65-79 usually 72-77.  Has been as low as 100/60, "and then it will come up again."  Notes also 137/72.  Average heart rate of 75-82.  - Patient reports good compliance with medication and/or lifestyle modification  - Her denies acute concerns or problems related to treatment plan  - She denies new onset of: chest pain, exercise intolerance, shortness of breath, dizziness, visual changes, headache, lower extremity swelling or claudication.   Last 3 blood pressure readings in our office are as follows: BP Readings from Last 3 Encounters:  08/21/19 139/80  05/22/19 109/69  05/08/19 140/84   Filed Weights   08/21/19 1552  Weight: 254 lb (115.2 kg)   HPI:  Hyperlipidemia:  79 y.o. female here for cholesterol  follow-up.   Notes she's afraid to go on statins.  Says "It's high but it's not that high."  - She denies new onset of: myalgias, arthralgias, increased fatigue more than normal, chest pains, exercise intolerance, shortness of breath, dizziness, visual changes, headache, lower extremity swelling or claudication.   Most recent cholesterol panel was:  Lab Results  Component Value Date   CHOL 223 (H) 03/20/2019   HDL 74 03/20/2019   LDLCALC 132 (H)  03/20/2019   TRIG 97 03/20/2019   CHOLHDL 3.0 03/20/2019   Hepatic Function Latest Ref Rng & Units 06/08/2019 03/20/2019 03/07/2019  Total Protein 6.1 - 8.1 g/dL 7.0 6.3 6.8  Albumin 3.7 - 4.7 g/dL - 3.9 -  AST 10 - 35 U/L '21 21 20  ' ALT 6 - 29 U/L '22 19 21  ' Alk Phosphatase 39 - 117 IU/L - 69 -  Total Bilirubin 0.2 - 1.2 mg/dL 0.6 0.5 0.6    No flowsheet data found.  Depression screen [facility] 2/9 08/21/2019 05/22/2019 05/08/2019 11/07/2018 08/08/2018  Decreased Interest 0 0 1 0 0  Down, Depressed, Hopeless 0 0 0 0 0  PHQ - 2 Score 0 0 1 0 0  Altered sleeping '2 3 2 3 3  ' Tired, decreased energy '2 1 1 3 1  ' Change in appetite 0 0 0 0 0  Feeling bad or failure about yourself  0 0 0 0 0  Trouble concentrating 0 0 0 0 0  Moving slowly or fidgety/restless 0 0 0 0 0  Suicidal thoughts 0 0 0 0 0  PHQ-9 Score '4 4 4 6 4  ' Difficult doing work/chores Not difficult at all Somewhat difficult Not difficult at all Somewhat difficult Not difficult at all      Impression and Recommendations:    1. BMI 40.0-44.9, adult ([place])   2. COPD mixed type ([place])   3. Essential hypertension   4. Pre-diabetes   5. Non compliance with medical treatment   6. Hyperlipidemia, unspecified hyperlipidemia type   7. Statin declined   8. High risk medication use      COPD Mixed Type - Patient's treatment plan is managed by pulmonary specialist. - Continue management as established.  See med list.  - Patient knows to continue to follow up with Dr. [name] of Pulmonology and direct all COPD/breathing concerns to specialist.  - Will continue to monitor alongside specialist.  Prediabetes - A1c measured at 6.3 last check two months ago, up from 6.2 five months ago, and 5.8 nine months ago.  Told patient that due to age, would not consider any diabetic medicines until sugar was well over 7.0.  -Extra time spent educating patient of A1c goals in a 79 year old person, and additionally, standard of care is different than in  younger patients.  Educated that lower blood sugars are much more detrimental in the elderly than a little bit on the higher side and hence, we are not as aggressive in starting diabetic medicines in the elderly.  - Repeat A1c in 4 months.  - Counseled patient on prevention of diabetes and discussed various treatment options, which always includes dietary and lifestyle modification as first line.    - Importance of low carb, heart-healthy diet discussed with patient in addition to regular aerobic exercise of 97mn 5d/week or more.   - Advised patient to continue checking her fasting blood sugars or postprandial sugars as directed.  - Will continue to monitor.  Essential Hypertension - Blood pressure currently is stable, at  goal. - Patient will continue current treatment regimen.  See med list.  - Counseled patient on pathophysiology of disease and discussed various treatment options, which always includes dietary and lifestyle modification as first line.   - Lifestyle changes such as dash and heart healthy diets and engaging in a regular exercise program discussed extensively with patient.   - Ambulatory blood pressure monitoring encouraged at least 3 times weekly.  Keep log and bring in every office visit.  Reminded patient that if they ever feel poorly in any way, to check their blood pressure and pulse.  - We will continue to monitor.  Hyperlipidemia - Statin Declined - Reviewed with patient today that her LDL has been elevated since before 06/29/2016, and patient has consistently read fused medicines despite her meeting criteria to go on them.  Last FLP obtained 5 months ago: Triglycerides = 97, WNL. HDL = 74, WNL. LDL = 132, elevated.  The 10-year ASCVD risk score Mikey Bussing DC Brooke Bonito., et al., 2013) is: 33.5% - Reviewed recommendations with patient today in stered. andard of care practices. - Extensive education provided and all questions answ - Advised beginning statin management at  this time.  - Patient continues to decline statins at this time.  Explained to patient I do not recommend rechecking her cholesterol in the future since she continues to decline medications and hence, these test would not change our management 1 bit.  -Patient understands risks associated with not going on medicines and tells me she does not think her cholesterol is "that bad "despite my extensive education today.  - Dietary changes such as low saturated & trans fat diets for hyperlipidemia and low carb diets for hypertriglyceridemia discussed with patient.    - Encouraged patient to follow AHA guidelines for regular exercise and also engage in weight loss if BMI above 25.   - We will continue to monitor and re-check as discussed.  BMI Counseling - Body mass index is 40.38 kg/m Explained to patient what BMI refers to, and what it means medically.    Told patient to think about it as a "medical risk stratification measurement" and how increasing BMI is associated with increasing risk/ or worsening state of various diseases such as hypertension, hyperlipidemia, diabetes, premature OA, depression etc.  American Heart Association guidelines for healthy diet, basically Mediterranean diet, and exercise guidelines of 30 minutes 5 days per week or more discussed in detail.  - Discussed goal of reducing BMI below 40, ideally below 35.  Health counseling performed.  All questions answered.  Lifestyle & Preventative Health Maintenance - Advised patient to continue working toward exercising and prudent weight loss to improve overall mental, physical, and emotional health.    - Reviewed the "spokes of the wheel" of mood and health management.  Stressed the importance of ongoing prudent habits, including regular exercise, appropriate sleep hygiene, healthful dietary habits, and prayer/meditation to relax.  - Encouraged patient to engage in daily physical activity as tolerated, especially a formal  exercise routine.  Recommended that the patient eventually strive for at least 150 minutes of moderate cardiovascular activity per week according to guidelines established by the [facility].   - Healthy dietary habits encouraged, including low-carb, and high amounts of lean protein in diet.   - Patient should also consume adequate amounts of water.  Recommendations - Follow-up in 4 months.   - As part of my medical decision making, I reviewed the following data within the [place] History obtained from pt /  family, CMA notes reviewed and incorporated if applicable, Labs reviewed, Radiograph/ tests reviewed if applicable and OV notes from prior OV's with me, as well as other specialists she/he has seen since seeing me last, were all reviewed and used in my medical decision making process today.    - Additionally, discussion had with patient regarding our treatment plan, and their biases/concerns about that plan were used in my medical decision making today.    - The patient agreed with the plan and demonstrated an understanding of the instructions.   No barriers to understanding were identified.    - Red flag symptoms and signs discussed in detail.  Patient expressed understanding regarding what to do in case of emergency\ urgent symptoms.   - The patient was advised to call back or seek an in-person evaluation if the symptoms worsen or if the condition fails to improve as anticipated.   Return for f/up regarding BP, weight loss in 4 months, re-check A1c at that time.    Time spent on visit including pre-visit chart review and post-visit care was 22+ minutes.  Note:  This note was prepared with assistance of Dragon voice recognition software. Occasional wrong-word or sound-a-like substitutions may have occurred due to the inherent limitations of voice recognition software.   The [place] was signed into law in 2016 which includes the topic of electronic health  records.  This provides immediate access to information in MyChart.  This includes consultation notes, operative notes, office notes, lab results and pathology reports.  If you have any questions about what you read please let us know at your next visit or call us at the office.  We are right here with you.   This document serves as a record of services personally performed by Tina Dance, DO. It was created on her behalf by Toni Amend, a trained medical scribe. The creation of this record is based on the scribe's personal observations and the provider's statements to them.   This case required medical decision making of at least moderate complexity. The above documentation from Toni Amend, medical scribe, has been reviewed by Marjory Sneddon, D.O.     __________________________________________________________________________________     Patient Care Team    Relationship Specialty Notifications Start End  Esaw Grandchild, NP PCP - General Family Medicine  09/08/17   Noralee Space, MD Consulting Physician Pulmonary Disease  09/08/17   Bo Merino, MD Consulting Physician Rheumatology  09/08/17   Marshell Garfinkel, MD Consulting Physician Pulmonary Disease  08/21/19      -Vitals obtained; medications/ allergies reconciled;  personal medical, social, Sx etc.histories were updated by CMA, reviewed by me and are reflected in chart   Patient Active Problem List   Diagnosis Date Noted  . COPD mixed type ([place]) 06/28/2017  . Hyperlipidemia 10/06/2007  . Essential hypertension 10/06/2007  . Pre-diabetes 11/07/2018  . BMI 40.0-44.9, adult ([place]) 08/08/2018  . Osteopenia of multiple sites 06/26/2016  . Seropositive rheumatoid arthritis of multiple sites ([place]) 04/29/2015  . Statin declined 08/21/2019  . Non compliance with medical treatment 08/21/2019  . Elevated LDL cholesterol level 11/07/2018  . Onychomycosis 04/06/2018  . Decreased GFR 01/05/2018  . Healthcare  maintenance 09/08/2017  . Lipoma of left upper extremity 08/09/2017  . History of COPD 06/26/2016  . History of hypertension 06/26/2016  . Morbid obesity ([place]) 05/04/2016  . High risk medication use 04/25/2016  . Vocal cord dysfunction 04/29/2015  . OTHER DISEASES OF VOCAL CORDS 10/06/2007  .  CERVICAL CANCER, HX OF 10/06/2007     Current Meds  Medication Sig  . albuterol (PROVENTIL) (2.5 MG/3ML) 0.083% nebulizer solution USE ONE VIAL IN NEBULIZER EVERY 6 HOURS AS NEEDED  . albuterol (VENTOLIN HFA) 108 (90 Base) MCG/ACT inhaler INHALE 1 TO 2 PUFFS BY MOUTH EVERY 6 HOURS AS NEEDED FOR WHEEZING AND FOR SHORTNESS OF BREATH  . Ascorbic Acid (VITAMIN C PO) Take by mouth daily.  . Biotin 5000 MCG CAPS Take 10,000 mcg by mouth daily.  . calcium gluconate 500 MG tablet Take 500 mg by mouth daily.    . Cholecalciferol (VITAMIN D3) 1000 units CAPS Take 1 capsule by mouth daily.   . Chromium 1 MG CAPS Take 1 mg by mouth daily. Increase to 2 caps if needed  . Coenzyme Q10 (COQ-10) 100 MG CAPS Take 1 capsule by mouth daily.    . fish oil-omega-3 fatty acids 1000 MG capsule Take 2 capsules by mouth daily.    . folic acid (FOLVITE) 1 MG tablet Take 1 tablet by mouth once daily  . hydrochlorothiazide (MICROZIDE) 12.5 MG capsule Take 1 capsule by mouth once daily  . losartan (COZAAR) 100 MG tablet Take 1 tablet  by mouth once daily  . LUTEIN PO Take by mouth daily.  . Methotrexate Sodium (METHOTREXATE, PF,) 50 MG/2ML injection INJECT 0.3ML SUBCUTANEOUSLY ONCE A WEEK  . Milk Thistle 200 MG CAPS Take 400 mg by mouth daily.    . Multiple Vitamin (MULTIVITAMIN) capsule Take 1 capsule by mouth daily.    . SYMBICORT 160-4.5 MCG/ACT inhaler Inhale 2 puffs by mouth twice daily  . vitamin E (VITAMIN E) 400 UNIT capsule Take 400 Units by mouth daily.     Allergies:  Allergies  Allergen Reactions  . Other     Lamisil, vinyl or polyvinyl chloride in shoes and leather gloves   . Theophylline     REACTION:  fever     ROS:  See above HPI for pertinent positives and negatives   Objective:   Blood pressure 139/80, pulse 82, height 5' 6.5" (1.689 m), weight 254 lb (115.2 kg).  (if some vitals are omitted, this means that patient was UNABLE to obtain them even though they were asked to get them prior to OV today.  They were asked to call us at their earliest convenience with these once obtained. )  General: A & O * 3; sounds in no acute distress; in usual state of health.  Skin: Pt confirms warm and dry extremities and pink fingertips HEENT: Pt confirms lips non-cyanotic Chest: Patient confirms normal chest excursion and movement Respiratory: speaking in full sentences, no conversational dyspnea; patient confirms no use of accessory muscles Psych: insight appears good, mood- appears full

## 2019-08-28 NOTE — Progress Notes (Signed)
Office Visit Note  Patient: Tina Hansen             Date of Birth: 1941/01/23           MRN: [id number]             PCP: Esaw Grandchild, NP Referring: Esaw Grandchild, NP Visit Date: 09/05/2019 Occupation: @GUAROCC @  Subjective:  Increased joint pain  History of Present Illness: Tina Hansen is a 79 y.o. female with history of rheumatoid arthritis and osteoarthritis.  She states recently she has been having some discomfort in her hands.  She has noticed some swelling in her right second MCP joint.  She also complains of discomfort in her right hip which she points to the trochanteric area.  She states she has been on methotrexate 0.3 mL subcu weekly.  This morning she increased her dose to 0.71mL as she was thinking it may benefit her hands.  Activities of Daily Living:  Patient reports morning stiffness for several hours.   Patient Denies nocturnal pain.  Difficulty dressing/grooming: Reports Difficulty climbing stairs: Denies Difficulty getting out of chair: Denies Difficulty using hands for taps, buttons, cutlery, and/or writing: Reports  Review of Systems  Constitutional: Negative for fatigue, night sweats, weight gain and weight loss.  HENT: Negative for mouth sores, trouble swallowing, trouble swallowing, mouth dryness and nose dryness.   Eyes: Negative for pain, redness, itching, visual disturbance and dryness.  Respiratory: Positive for shortness of breath and difficulty breathing. Negative for cough.        Due to COPD   Cardiovascular: Negative for chest pain, palpitations, hypertension, irregular heartbeat and swelling in legs/feet.  Gastrointestinal: Negative for blood in stool, constipation and diarrhea.  Endocrine: Negative for increased urination.  Genitourinary: Negative for difficulty urinating, painful urination and vaginal dryness.  Musculoskeletal: Positive for arthralgias, joint pain, joint swelling and morning stiffness. Negative for myalgias,  muscle weakness, muscle tenderness and myalgias.  Skin: Negative for color change, rash, hair loss, skin tightness, ulcers and sensitivity to sunlight.  Allergic/Immunologic: Negative for susceptible to infections.  Neurological: Negative for dizziness, numbness, headaches, memory loss, night sweats and weakness.  Hematological: Positive for bruising/bleeding tendency. Negative for swollen glands.  Psychiatric/Behavioral: Positive for sleep disturbance. Negative for depressed mood and confusion. The patient is not nervous/anxious.     PMFS History:  Patient Active Problem List   Diagnosis Date Noted  . Statin declined 08/21/2019  . Non compliance with medical treatment 08/21/2019  . Elevated LDL cholesterol level 11/07/2018  . Pre-diabetes 11/07/2018  . BMI 40.0-44.9, adult ([place]) 08/08/2018  . Onychomycosis 04/06/2018  . Decreased GFR 01/05/2018  . Healthcare maintenance 09/08/2017  . Lipoma of left upper extremity 08/09/2017  . COPD mixed type ([place]) 06/28/2017  . History of COPD 06/26/2016  . History of hypertension 06/26/2016  . Osteopenia of multiple sites 06/26/2016  . Morbid obesity ([place]) 05/04/2016  . High risk medication use 04/25/2016  . Vocal cord dysfunction 04/29/2015  . Seropositive rheumatoid arthritis of multiple sites ([place]) 04/29/2015  . Hyperlipidemia 10/06/2007  . Essential hypertension 10/06/2007  . OTHER DISEASES OF VOCAL CORDS 10/06/2007  . CERVICAL CANCER, HX OF 10/06/2007    Past Medical History:  Diagnosis Date  . Asthma   . Cervical cancer ([place])   . Emphysema lung ([place])   . Hyperlipidemia   . Hypertension   . Rheumatoid arthritis(714.0)    Tina Hansen    Family History  Problem Relation Age of Onset  .  Asthma Mother   . Breast cancer Mother   . Cancer Mother   . Alcohol abuse Father   . Diabetes Son   . Cancer Maternal Uncle   . Heart attack Maternal Uncle    Past Surgical History:  Procedure Laterality Date  . ABDOMINAL HYSTERECTOMY    .  BREAST SURGERY    . Breat implants    . LIPOMA EXCISION Left    shoulder   . TUBAL LIGATION    . TUMOR REMOVAL Right    FOREARM   Social History   Social History Narrative  . Not on file   Immunization History  Administered Date(s) Administered  . Pneumococcal Conjugate-13 04/06/2018  . Pneumococcal Polysaccharide-23 06/27/2007, 03/22/2009  . Zoster Recombinat (Shingrix) 05/11/2019     Objective: Vital Signs: BP (!) 160/72 (BP Location: Other (Comment), Patient Position: Sitting, Cuff Size: Normal) Comment (BP Location): Right forearm  Pulse 82   Resp 18   Ht 5' 6.5" (1.689 m)   Wt 257 lb 6.4 oz (116.8 kg)   BMI 40.92 kg/m    Physical Exam Vitals and nursing note reviewed.  Constitutional:      Appearance: She is well-developed.  HENT:     Head: Normocephalic and atraumatic.  Eyes:     Conjunctiva/sclera: Conjunctivae normal.  Cardiovascular:     Rate and Rhythm: Normal rate and regular rhythm.     Heart sounds: Normal heart sounds.  Pulmonary:     Effort: Pulmonary effort is normal.     Breath sounds: Normal breath sounds.  Abdominal:     General: Bowel sounds are normal.     Palpations: Abdomen is soft.  Musculoskeletal:     Cervical back: Normal range of motion.  Lymphadenopathy:     Cervical: No cervical adenopathy.  Skin:    General: Skin is warm and dry.     Capillary Refill: Capillary refill takes less than 2 seconds.  Neurological:     Mental Status: She is alert and oriented to person, place, and time.  Psychiatric:        Behavior: Behavior normal.      Musculoskeletal Exam: C-spine was in good range of motion.  Thoracic and lumbar spine were in limited range of motion.  Shoulder joints, elbow joints with good range of motion.  She has bilateral CMC tenderness and thickening.  PIP and DIP thickening was noted.  She had right second MCP synovial thickening but no synovitis.  Shoulder joints are in good range of motion.  She had tenderness on  palpation of her right trochanteric bursa.  Knee joints are in good range of motion without any warmth swelling or effusion.  She has no tenderness of her ankles are MTPs.   CDAI Exam: CDAI Score: 0.4  Patient Global: 2 mm; Provider Global: 2 mm Swollen: 0 ; Tender: 2  Joint Exam 09/05/2019      Right  Left  CMC   Tender   Tender     Investigation: No additional findings.  Imaging: No results found.  Recent Labs: Lab Results  Component Value Date   WBC 6.2 06/08/2019   HGB 14.5 06/08/2019   PLT 273 06/08/2019   NA 144 06/08/2019   K 5.3 06/08/2019   CL 108 06/08/2019   CO2 25 06/08/2019   GLUCOSE 153 (H) 06/08/2019   BUN 20 06/08/2019   CREATININE 1.00 (H) 06/08/2019   BILITOT 0.6 06/08/2019   ALKPHOS 69 03/20/2019   AST 21 06/08/2019  ALT 22 06/08/2019   PROT 7.0 06/08/2019   ALBUMIN 3.9 03/20/2019   CALCIUM 9.7 06/08/2019   GFRAA 62 06/08/2019    Speciality Comments: No specialty comments available.  Procedures:  No procedures performed Allergies: Other and Theophylline   Assessment / Plan:     Visit Diagnoses: Seropositive rheumatoid arthritis of multiple sites ([place]) - +RF, +CCP, h/o elevated sed rate she is doing very well on low-dose methotrexate.  She complains of increased pain and discomfort in her hands which she describes over her CMC joints.  I detailed discussion about joint protection muscle strengthening.  She had mild right second MCP thickening but no synovitis was noted.  She took increased dose of methotrexate this morning.  Have advised her to stay on 0.3 mL subcu weekly as the symptoms are coming from osteoarthritis and not from rheumatoid arthritis.  High risk medication use - Methotrexate 0.3 ml every 7 days and folic acid 1 mg 1 tablet.  -Her creatinine has been mildly elevated.  We will check labs today and then every 3 months to monitor for drug toxicity.  Plan: CBC with Differential/Platelet, COMPLETE METABOLIC PANEL WITH GFR  Primary  osteoarthritis of both hands-she has bilateral CMC arthritis.  Joint protection was discussed.  Trochanteric bursitis of right hip-IT band exercises were demonstrated and discussed.  A handout was given.  Osteopenia of multiple sites - DEXA 05/02/2019 T-score: -1.7, BMD 0.662 right femoral neck.  Bone density was discussed.  Use of calcium and vitamin D was discussed.  Need for resistive exercise was discussed.  Vitamin D deficiency-she is on vitamin D supplement.  Elevated LDL cholesterol level  Pre-diabetes  History of hyperlipidemia  History of COPD-she has frequent wheezing.  History of hypertension-blood pressure was elevated today.  She was advised to monitor blood pressure closely and follow-up with her PCP.  CERVICAL CANCER, HX OF  Orders: Orders Placed This Encounter  Procedures  . CBC with Differential/Platelet  . COMPLETE METABOLIC PANEL WITH GFR   No orders of the defined types were placed in this encounter.   Face-to-face time spent with patient was 30 minutes. Greater than 50% of time was spent in counseling and coordination of care.  Follow-Up Instructions: Return in about 5 months (around 02/05/2020) for Rheumatoid arthritis, Osteoarthritis.   Bo Merino, MD  Note - This record has been created using Editor, commissioning.  Chart creation errors have been sought, but may not always  have been located. Such creation errors do not reflect on  the standard of medical care.

## 2019-09-05 ENCOUNTER — Encounter: Payer: Self-pay | Admitting: Rheumatology

## 2019-09-05 ENCOUNTER — Other Ambulatory Visit: Payer: Self-pay

## 2019-09-05 ENCOUNTER — Ambulatory Visit (INDEPENDENT_AMBULATORY_CARE_PROVIDER_SITE_OTHER): Payer: Medicare HMO | Admitting: Rheumatology

## 2019-09-05 VITALS — BP 160/72 | HR 82 | Resp 18 | Ht 66.5 in | Wt 257.4 lb

## 2019-09-05 DIAGNOSIS — Z8541 Personal history of malignant neoplasm of cervix uteri: Secondary | ICD-10-CM

## 2019-09-05 DIAGNOSIS — R7303 Prediabetes: Secondary | ICD-10-CM

## 2019-09-05 DIAGNOSIS — E7800 Pure hypercholesterolemia, unspecified: Secondary | ICD-10-CM

## 2019-09-05 DIAGNOSIS — Z8639 Personal history of other endocrine, nutritional and metabolic disease: Secondary | ICD-10-CM | POA: Diagnosis not present

## 2019-09-05 DIAGNOSIS — M0579 Rheumatoid arthritis with rheumatoid factor of multiple sites without organ or systems involvement: Secondary | ICD-10-CM

## 2019-09-05 DIAGNOSIS — Z8679 Personal history of other diseases of the circulatory system: Secondary | ICD-10-CM

## 2019-09-05 DIAGNOSIS — Z8709 Personal history of other diseases of the respiratory system: Secondary | ICD-10-CM | POA: Diagnosis not present

## 2019-09-05 DIAGNOSIS — M8589 Other specified disorders of bone density and structure, multiple sites: Secondary | ICD-10-CM | POA: Diagnosis not present

## 2019-09-05 DIAGNOSIS — M19042 Primary osteoarthritis, left hand: Secondary | ICD-10-CM

## 2019-09-05 DIAGNOSIS — M19041 Primary osteoarthritis, right hand: Secondary | ICD-10-CM | POA: Diagnosis not present

## 2019-09-05 DIAGNOSIS — M7061 Trochanteric bursitis, right hip: Secondary | ICD-10-CM

## 2019-09-05 DIAGNOSIS — E559 Vitamin D deficiency, unspecified: Secondary | ICD-10-CM

## 2019-09-05 DIAGNOSIS — Z79899 Other long term (current) drug therapy: Secondary | ICD-10-CM | POA: Diagnosis not present

## 2019-09-05 NOTE — Patient Instructions (Signed)
Standing Labs We placed an order today for your standing lab work.    Please come back and get your standing labs in June and every 3 months  We have open lab daily Monday through Thursday from 8:30-12:30 PM and 1:30-4:30 PM and Friday from 8:30-12:30 PM and 1:30-4:00 PM at the office of Dr. Bo Merino.   You may experience shorter wait times on Monday and Friday afternoons. The office is located at [address], [place], [place], [place] [address] No appointment is necessary.   Labs are drawn by [facility].  You may receive a bill from [facility] for your lab work.  If you wish to have your labs drawn at another location, please call the office 24 hours in advance to send orders.  If you have any questions regarding directions or hours of operation,  please call [phone].   Just as a reminder please drink plenty of water prior to coming for your lab work. Thanks!    Iliotibial Band Syndrome Rehab Ask your health care provider which exercises are safe for you. Do exercises exactly as told by your health care provider and adjust them as directed. It is normal to feel mild stretching, pulling, tightness, or discomfort as you do these exercises. Stop right away if you feel sudden pain or your pain gets significantly worse. Do not begin these exercises until told by your health care provider. Stretching and range-of-motion exercises These exercises warm up your muscles and joints and improve the movement and flexibility of your hip and pelvis. Quadriceps stretch, prone  1. Lie on your abdomen on a firm surface, such as a bed or padded floor (prone position). 2. Bend your left / right knee and reach back to hold your ankle or pant leg. If you cannot reach your ankle or pant leg, loop a belt around your foot and grab the belt instead. 3. Gently pull your heel toward your buttocks. Your knee should not slide out to the side. You should feel a stretch in the front of your thigh and knee  (quadriceps). 4. Hold this position for __________ seconds. Repeat __________ times. Complete this exercise __________ times a day. Iliotibial band stretch An iliotibial band is a strong band of muscle tissue that runs from the outer side of your hip to the outer side of your thigh and knee. 1. Lie on your side with your left / right leg in the top position. 2. Bend both of your knees and grab your left / right ankle. Stretch out your bottom arm to help you balance. 3. Slowly bring your top knee back so your thigh goes behind your trunk. 4. Slowly lower your top leg toward the floor until you feel a gentle stretch on the outside of your left / right hip and thigh. If you do not feel a stretch and your knee will not fall farther, place the heel of your other foot on top of your knee and pull your knee down toward the floor with your foot. 5. Hold this position for __________ seconds. Repeat __________ times. Complete this exercise __________ times a day. Strengthening exercises These exercises build strength and endurance in your hip and pelvis. Endurance is the ability to use your muscles for a long time, even after they get tired. Straight leg raises, side-lying This exercise strengthens the muscles that rotate the leg at the hip and move it away from your body (hip abductors). 1. Lie on your side with your left / right leg  in the top position. Lie so your head, shoulder, hip, and knee line up. You may bend your bottom knee to help you balance. 2. Roll your hips slightly forward so your hips are stacked directly over each other and your left / right knee is facing forward. 3. Tense the muscles in your outer thigh and lift your top leg 4-6 inches (10-15 cm). 4. Hold this position for __________ seconds. 5. Slowly return to the starting position. Let your muscles relax completely before doing another repetition. Repeat __________ times. Complete this exercise __________ times a day. Leg raises,  prone This exercise strengthens the muscles that move the hips (hip extensors). 1. Lie on your abdomen on your bed or a firm surface. You can put a pillow under your hips if that is more comfortable for your lower back. 2. Bend your left / right knee so your foot is straight up in the air. 3. Squeeze your buttocks muscles and lift your left / right thigh off the bed. Do not let your back arch. 4. Tense your thigh muscle as hard as you can without increasing any knee pain. 5. Hold this position for __________ seconds. 6. Slowly lower your leg to the starting position and allow it to relax completely. Repeat __________ times. Complete this exercise __________ times a day. Hip hike 1. Stand sideways on a bottom step. Stand on your left / right leg with your other foot unsupported next to the step. You can hold on to the railing or wall for balance if needed. 2. Keep your knees straight and your torso square. Then lift your left / right hip up toward the ceiling. 3. Slowly let your left / right hip lower toward the floor, past the starting position. Your foot should get closer to the floor. Do not lean or bend your knees. Repeat __________ times. Complete this exercise __________ times a day. This information is not intended to replace advice given to you by your health care provider. Make sure you discuss any questions you have with your health care provider. Document Revised: 09/29/2018 Document Reviewed: 03/30/2018 Elsevier Patient Education  [place]. Hand Exercises Hand exercises can be helpful for almost anyone. These exercises can strengthen the hands, improve flexibility and movement, and increase blood flow to the hands. These results can make work and daily tasks easier. Hand exercises can be especially helpful for people who have joint pain from arthritis or have nerve damage from overuse (carpal tunnel syndrome). These exercises can also help people who have injured a  hand. Exercises Most of these hand exercises are gentle stretching and motion exercises. It is usually safe to do them often throughout the day. Warming up your hands before exercise may help to reduce stiffness. You can do this with gentle massage or by placing your hands in warm water for 10-15 minutes. It is normal to feel some stretching, pulling, tightness, or mild discomfort as you begin new exercises. This will gradually improve. Stop an exercise right away if you feel sudden, severe pain or your pain gets worse. Ask your health care provider which exercises are best for you. Knuckle bend or "claw" fist 1. Stand or sit with your arm, hand, and all five fingers pointed straight up. Make sure to keep your wrist straight during the exercise. 2. Gently bend your fingers down toward your palm until the tips of your fingers are touching the top of your palm. Keep your big knuckle straight and just bend the small  knuckles in your fingers. 3. Hold this position for __________ seconds. 4. Straighten (extend) your fingers back to the starting position. Repeat this exercise 5-10 times with each hand. Full finger fist 1. Stand or sit with your arm, hand, and all five fingers pointed straight up. Make sure to keep your wrist straight during the exercise. 2. Gently bend your fingers into your palm until the tips of your fingers are touching the middle of your palm. 3. Hold this position for __________ seconds. 4. Extend your fingers back to the starting position, stretching every joint fully. Repeat this exercise 5-10 times with each hand. Straight fist 1. Stand or sit with your arm, hand, and all five fingers pointed straight up. Make sure to keep your wrist straight during the exercise. 2. Gently bend your fingers at the big knuckle, where your fingers meet your hand, and the middle knuckle. Keep the knuckle at the tips of your fingers straight and try to touch the bottom of your palm. 3. Hold this  position for __________ seconds. 4. Extend your fingers back to the starting position, stretching every joint fully. Repeat this exercise 5-10 times with each hand. Tabletop 1. Stand or sit with your arm, hand, and all five fingers pointed straight up. Make sure to keep your wrist straight during the exercise. 2. Gently bend your fingers at the big knuckle, where your fingers meet your hand, as far down as you can while keeping the small knuckles in your fingers straight. Think of forming a tabletop with your fingers. 3. Hold this position for __________ seconds. 4. Extend your fingers back to the starting position, stretching every joint fully. Repeat this exercise 5-10 times with each hand. Finger spread 1. Place your hand flat on a table with your palm facing down. Make sure your wrist stays straight as you do this exercise. 2. Spread your fingers and thumb apart from each other as far as you can until you feel a gentle stretch. Hold this position for __________ seconds. 3. Bring your fingers and thumb tight together again. Hold this position for __________ seconds. Repeat this exercise 5-10 times with each hand. Making circles 1. Stand or sit with your arm, hand, and all five fingers pointed straight up. Make sure to keep your wrist straight during the exercise. 2. Make a circle by touching the tip of your thumb to the tip of your index finger. 3. Hold for __________ seconds. Then open your hand wide. 4. Repeat this motion with your thumb and each finger on your hand. Repeat this exercise 5-10 times with each hand. Thumb motion 1. Sit with your forearm resting on a table and your wrist straight. Your thumb should be facing up toward the ceiling. Keep your fingers relaxed as you move your thumb. 2. Lift your thumb up as high as you can toward the ceiling. Hold for __________ seconds. 3. Bend your thumb across your palm as far as you can, reaching the tip of your thumb for the small finger  (pinkie) side of your palm. Hold for __________ seconds. Repeat this exercise 5-10 times with each hand. Grip strengthening  1. Hold a stress ball or other soft ball in the middle of your hand. 2. Slowly increase the pressure, squeezing the ball as much as you can without causing pain. Think of bringing the tips of your fingers into the middle of your palm. All of your finger joints should bend when doing this exercise. 3. Hold your squeeze for __________ seconds,  then relax. Repeat this exercise 5-10 times with each hand. Contact a health care provider if:  Your hand pain or discomfort gets much worse when you do an exercise.  Your hand pain or discomfort does not improve within 2 hours after you exercise. If you have any of these problems, stop doing these exercises right away. Do not do them again unless your health care provider says that you can. Get help right away if:  You develop sudden, severe hand pain or swelling. If this happens, stop doing these exercises right away. Do not do them again unless your health care provider says that you can. This information is not intended to replace advice given to you by your health care provider. Make sure you discuss any questions you have with your health care provider. Document Revised: 09/29/2018 Document Reviewed: 06/09/2018 Elsevier Patient Education  [place].

## 2019-09-06 LAB — CBC WITH DIFFERENTIAL/PLATELET
Absolute Monocytes: 694 cells/uL (ref 200–950)
Basophils Absolute: 61 cells/uL (ref 0–200)
Basophils Relative: 0.6 %
Eosinophils Absolute: 163 cells/uL (ref 15–500)
Eosinophils Relative: 1.6 %
HCT: 44.8 % (ref 35.0–45.0)
Hemoglobin: 14.6 g/dL (ref 11.7–15.5)
Lymphs Abs: 1112 cells/uL (ref 850–3900)
MCH: 29.7 pg (ref 27.0–33.0)
MCHC: 32.6 g/dL (ref 32.0–36.0)
MCV: 91.1 fL (ref 80.0–100.0)
MPV: 10.2 fL (ref 7.5–12.5)
Monocytes Relative: 6.8 %
Neutro Abs: 8170 cells/uL — ABNORMAL HIGH (ref 1500–7800)
Neutrophils Relative %: 80.1 %
Platelets: 277 10*3/uL (ref 140–400)
RBC: 4.92 10*6/uL (ref 3.80–5.10)
RDW: 13.3 % (ref 11.0–15.0)
Total Lymphocyte: 10.9 %
WBC: 10.2 10*3/uL (ref 3.8–10.8)

## 2019-09-06 LAB — COMPLETE METABOLIC PANEL WITH GFR
AG Ratio: 1.4 (calc) (ref 1.0–2.5)
ALT: 18 U/L (ref 6–29)
AST: 18 U/L (ref 10–35)
Albumin: 4 g/dL (ref 3.6–5.1)
Alkaline phosphatase (APISO): 65 U/L (ref 37–153)
BUN/Creatinine Ratio: 21 (calc) (ref 6–22)
BUN: 20 mg/dL (ref 7–25)
CO2: 27 mmol/L (ref 20–32)
Calcium: 9.4 mg/dL (ref 8.6–10.4)
Chloride: 103 mmol/L (ref 98–110)
Creat: 0.94 mg/dL — ABNORMAL HIGH (ref 0.60–0.93)
GFR, Est African American: 67 mL/min/{1.73_m2} (ref >=60)
GFR, Est Non African American: 58 mL/min/{1.73_m2} — ABNORMAL LOW (ref >=60)
Globulin: 2.8 g/dL (calc) (ref 1.9–3.7)
Glucose, Bld: 151 mg/dL — ABNORMAL HIGH (ref 65–99)
Potassium: 4.9 mmol/L (ref 3.5–5.3)
Sodium: 140 mmol/L (ref 135–146)
Total Bilirubin: 0.6 mg/dL (ref 0.2–1.2)
Total Protein: 6.8 g/dL (ref 6.1–8.1)

## 2019-09-06 NOTE — Progress Notes (Signed)
Glucose is elevated, probably not fasting.  Rest the labs are stable.

## 2019-09-18 ENCOUNTER — Other Ambulatory Visit: Payer: Self-pay | Admitting: Adult Health

## 2019-09-25 DIAGNOSIS — R52 Pain, unspecified: Secondary | ICD-10-CM | POA: Insufficient documentation

## 2019-09-25 DIAGNOSIS — R5383 Other fatigue: Secondary | ICD-10-CM | POA: Insufficient documentation

## 2019-09-26 ENCOUNTER — Other Ambulatory Visit: Payer: Self-pay

## 2019-09-26 ENCOUNTER — Telehealth: Payer: Self-pay | Admitting: Adult Health

## 2019-09-26 MED ORDER — HYDROCHLOROTHIAZIDE 12.5 MG PO CAPS: 12.5000 mg | ORAL_CAPSULE | Freq: Every day | ORAL | 0 refills | Status: DC

## 2019-09-26 MED ORDER — LOSARTAN POTASSIUM 100 MG PO TABS: ORAL_TABLET | ORAL | 0 refills | Status: DC

## 2019-09-26 NOTE — Telephone Encounter (Signed)
Patient is requesting a refill of her hydrochlorothiazide, if approved please send to [facility] on [address]

## 2019-09-26 NOTE — Addendum Note (Signed)
Addended by: Mickel Crow on: 09/26/2019 03:45 PM   Modules accepted: Orders

## 2019-09-26 NOTE — Telephone Encounter (Signed)
Refill sent to pharmacy. AS, CMA 

## 2019-12-26 ENCOUNTER — Other Ambulatory Visit: Payer: Self-pay | Admitting: *Deleted

## 2019-12-26 ENCOUNTER — Other Ambulatory Visit: Payer: Self-pay

## 2019-12-26 ENCOUNTER — Ambulatory Visit (INDEPENDENT_AMBULATORY_CARE_PROVIDER_SITE_OTHER): Payer: Medicare HMO | Admitting: Physician Assistant

## 2019-12-26 ENCOUNTER — Encounter: Payer: Self-pay | Admitting: Physician Assistant

## 2019-12-26 VITALS — BP 114/74 | HR 89 | Temp 98.0°F | Ht 66.5 in | Wt 254.4 lb

## 2019-12-26 DIAGNOSIS — R7303 Prediabetes: Secondary | ICD-10-CM | POA: Diagnosis not present

## 2019-12-26 DIAGNOSIS — Z5320 Procedure and treatment not carried out because of patient's decision for unspecified reasons: Secondary | ICD-10-CM

## 2019-12-26 DIAGNOSIS — E7849 Other hyperlipidemia: Secondary | ICD-10-CM | POA: Diagnosis not present

## 2019-12-26 DIAGNOSIS — Z79899 Other long term (current) drug therapy: Secondary | ICD-10-CM | POA: Diagnosis not present

## 2019-12-26 DIAGNOSIS — E785 Hyperlipidemia, unspecified: Secondary | ICD-10-CM

## 2019-12-26 DIAGNOSIS — J449 Chronic obstructive pulmonary disease, unspecified: Secondary | ICD-10-CM | POA: Diagnosis not present

## 2019-12-26 DIAGNOSIS — I10 Essential (primary) hypertension: Secondary | ICD-10-CM | POA: Diagnosis not present

## 2019-12-26 LAB — POCT GLYCOSYLATED HEMOGLOBIN (HGB A1C): Hemoglobin A1C: 6.3 % — AB (ref 4.0–5.6)

## 2019-12-26 NOTE — Assessment & Plan Note (Signed)
>>  ASSESSMENT AND PLAN FOR PRE-DIABETES WRITTEN ON 12/26/2019  2:10 PM BY ABONZA, [name], PA-C  -A1c today is 6.3, stable -Asymptomatic -Follow low carbohydrate and glucose diet. -Will continue to monitor.

## 2019-12-26 NOTE — Assessment & Plan Note (Addendum)
-   BP today is 143/84 HR 89, recheck BP 114/74 - Continue Losartan 100 mg and HCTZ 12.5 mg - Continue ambulatory BP and pulse monitoring and notify the clinic if BP consistently >140/90 or <90/60. - Follow DASH diet. - Stay well hydrated, at least 64 fl oz - Encourage to stay as active as possible.

## 2019-12-26 NOTE — Patient Instructions (Signed)

## 2019-12-26 NOTE — Assessment & Plan Note (Addendum)
-  Stable -Followed by Pulmonology -Continue current medication regimen.

## 2019-12-26 NOTE — Progress Notes (Signed)
Established Patient Office Visit  Subjective:  Patient ID: Tina Hansen, female    DOB: 10-Apr-1941  Age: 79 y.o. MRN: [id number]  CC:  Chief Complaint  Patient presents with  . Hyperlipidemia  . Hypertension    HPI Tina Hansen presents for follow-up on hypertension, hyperlipidemia and pre-diabetes.  HTN: Pt denies chest pain, palpitations, dizziness or lower extremity swelling. Taking medication as directed without side effects. Checks BP at home and readings range in 126-135/65-70s.   HLD: Pt is trying to control elevated cholesterol with diet. She has declined statin therapy int he past. Reports her diet consists of red meat, chicken, pork, vegetables, and cheese. She has COPD which limits her activity level. She does have an exercise machine that's similar to row boat, which she sometimes uses.   Pre-diabetes: Denies increased thirst or urination. Last A1c 6.3%  COPD: Followed by Pulmonology. Reports Symbicort really helps with her breathing.   Past Medical History:  Diagnosis Date  . Asthma   . Cervical cancer ([place])   . Emphysema lung ([place])   . Hyperlipidemia   . Hypertension   . Rheumatoid arthritis(714.0)    [name]    Past Surgical History:  Procedure Laterality Date  . ABDOMINAL HYSTERECTOMY    . BREAST SURGERY    . Breat implants    . LIPOMA EXCISION Left    shoulder   . TUBAL LIGATION    . TUMOR REMOVAL Right    FOREARM    Family History  Problem Relation Age of Onset  . Asthma Mother   . Breast cancer Mother   . Cancer Mother   . Alcohol abuse Father   . Diabetes Son   . Cancer Maternal Uncle   . Heart attack Maternal Uncle     Social History   Socioeconomic History  . Marital status: Divorced    Spouse name: Not on file  . Number of children: 1  . Years of education: Not on file  . Highest education level: Not on file  Occupational History  . Occupation: retired  Tobacco Use  . Smoking status: Former Smoker     Packs/day: 1.00    Years: 30.00    Pack years: 30.00    Types: Cigarettes    Quit date: 06/22/1985    Years since quitting: 34.5  . Smokeless tobacco: Never Used  Vaping Use  . Vaping Use: Never used  Substance and Sexual Activity  . Alcohol use: Yes    Comment: OCCASSIONAL GLASS OF WINE 1-2 MONTHLY  . Drug use: No  . Sexual activity: Never  Other Topics Concern  . Not on file  Social History Narrative  . Not on file   Social Determinants of Health   Financial Resource Strain:   . Difficulty of Paying Living Expenses:   Food Insecurity:   . Worried About Charity fundraiser in the Last Year:   . Arboriculturist in the Last Year:   Transportation Needs:   . Film/video editor (Medical):   Marland Kitchen Lack of Transportation (Non-Medical):   Physical Activity:   . Days of Exercise per Week:   . Minutes of Exercise per Session:   Stress:   . Feeling of Stress :   Social Connections:   . Frequency of Communication with Friends and Family:   . Frequency of Social Gatherings with Friends and Family:   . Attends Religious Services:   . Active Member of Clubs or Organizations:   .  Attends Archivist Meetings:   Marland Kitchen Marital Status:   Intimate Partner Violence:   . Fear of Current or Ex-Partner:   . Emotionally Abused:   Marland Kitchen Physically Abused:   . Sexually Abused:     Outpatient Medications Prior to Visit  Medication Sig Dispense Refill  . albuterol (PROVENTIL) (2.5 MG/3ML) 0.083% nebulizer solution USE ONE VIAL IN NEBULIZER EVERY 6 HOURS AS NEEDED 75 mL 2  . albuterol (VENTOLIN HFA) 108 (90 Base) MCG/ACT inhaler INHALE 1 TO 2 PUFFS BY MOUTH EVERY 6 HOURS AS NEEDED FOR WHEEZING AND FOR SHORTNESS OF BREATH 18 g 3  . Ascorbic Acid (VITAMIN C PO) Take by mouth daily.    . Biotin 5000 MCG CAPS Take 10,000 mcg by mouth daily.    . calcium gluconate 500 MG tablet Take 500 mg by mouth daily.      . Cholecalciferol (VITAMIN D3) 1000 units CAPS Take 1 capsule by mouth daily.     .  Chromium 1 MG CAPS Take 1 mg by mouth daily. Increase to 2 caps if needed    . Coenzyme Q10 (COQ-10) 100 MG CAPS Take 1 capsule by mouth daily.      . fish oil-omega-3 fatty acids 1000 MG capsule Take 2 capsules by mouth daily.      . folic acid (FOLVITE) 1 MG tablet Take 1 tablet by mouth once daily 90 tablet 2  . hydrochlorothiazide (MICROZIDE) 12.5 MG capsule Take 1 capsule (12.5 mg total) by mouth daily. 90 capsule 0  . losartan (COZAAR) 100 MG tablet Take 1 tablet  by mouth once daily 90 tablet 0  . LUTEIN PO Take by mouth daily.    . Methotrexate Sodium (METHOTREXATE, PF,) 50 MG/2ML injection INJECT 0.3ML SUBCUTANEOUSLY ONCE A WEEK 4 mL 0  . Methylsulfonylmethane (MSM PO) Take by mouth daily.    . Milk Thistle 200 MG CAPS Take 400 mg by mouth daily.      . Multiple Vitamin (MULTIVITAMIN) capsule Take 1 capsule by mouth daily.      . NON FORMULARY Take 1 capsule by mouth daily. Collagen capsule    . SYMBICORT 160-4.5 MCG/ACT inhaler Inhale 2 puffs by mouth twice daily 11 g 6  . vitamin E (VITAMIN E) 400 UNIT capsule Take 400 Units by mouth daily.     No facility-administered medications prior to visit.    Allergies  Allergen Reactions  . Other     Lamisil, vinyl or polyvinyl chloride in shoes and leather gloves   . Theophylline     REACTION: fever    ROS Review of Systems   A fourteen system review of systems was performed and found to be positive as per HPI.   Objective:    Physical Exam General:  Well Developed, well nourished, appropriate for stated age.  Neuro:  Alert and oriented,  extra-ocular muscles intact  HEENT:  Normocephalic, atraumatic, neck supple Skin:  no gross rash, warm, pink. Cardiac:  RRR, S1 S2 Respiratory:  Normal breath sounds, Not using accessory muscles Vascular:  Ext warm, no cyanosis apprec.; cap RF less 2 sec. Psych:  No HI/SI, judgement and insight good, Euthymic mood. Full Affect.  BP 114/74   Pulse 89   Temp 98 F (36.7 C) (Oral)   Ht  5' 6.5" (1.689 m)   Wt 254 lb 6.4 oz (115.4 kg)   SpO2 97% Comment: on RA  BMI 40.45 kg/m  Wt Readings from Last 3 Encounters:  12/26/19 254 lb  6.4 oz (115.4 kg)  09/05/19 257 lb 6.4 oz (116.8 kg)  08/21/19 254 lb (115.2 kg)     Health Maintenance Due  Topic Date Due  . Hepatitis C Screening  Never done  . COVID-19 Vaccine (1) Never done  . TETANUS/TDAP  Never done    There are no preventive care reminders to display for this patient.  Lab Results  Component Value Date   TSH 4.560 (H) 03/20/2019   Lab Results  Component Value Date   WBC 10.2 09/05/2019   HGB 14.6 09/05/2019   HCT 44.8 09/05/2019   MCV 91.1 09/05/2019   PLT 277 09/05/2019   Lab Results  Component Value Date   NA 140 09/05/2019   K 4.9 09/05/2019   CO2 27 09/05/2019   GLUCOSE 151 (H) 09/05/2019   BUN 20 09/05/2019   CREATININE 0.94 (H) 09/05/2019   BILITOT 0.6 09/05/2019   ALKPHOS 69 03/20/2019   AST 18 09/05/2019   ALT 18 09/05/2019   PROT 6.8 09/05/2019   ALBUMIN 3.9 03/20/2019   CALCIUM 9.4 09/05/2019   Lab Results  Component Value Date   CHOL 223 (H) 03/20/2019   Lab Results  Component Value Date   HDL 74 03/20/2019   Lab Results  Component Value Date   LDLCALC 132 (H) 03/20/2019   Lab Results  Component Value Date   TRIG 97 03/20/2019   Lab Results  Component Value Date   CHOLHDL 3.0 03/20/2019   Lab Results  Component Value Date   HGBA1C 6.3 (A) 12/26/2019      Assessment & Plan:   Problem List Items Addressed This Visit      Cardiovascular and Mediastinum   Essential hypertension - Primary    - BP today is 143/84 HR 89, recheck BP 114/74 - Continue Losartan 100 mg and HCTZ 12.5 mg - Continue ambulatory BP and pulse monitoring and notify the clinic if BP consistently >140/90 or <90/60. - Follow DASH diet. - Stay well hydrated, at least 64 fl oz - Encourage to stay as active as possible.         Respiratory   COPD mixed type ([place])    -Stable -Followed by  Pulmonology -Continue current medication regimen.         Other   Hyperlipidemia    - Last lipid panel: total cholesterol and LDL elevated - Follow heart healthy diet and reduce red meat, fried foods and dairy. Handout provided. - Placed lab order to recheck lipid panel, which she wants to get done with her routine rheumatology labs.        Relevant Orders   Lipid Profile   Pre-diabetes    -A1c today is 6.3, stable -Asymptomatic -Follow low carbohydrate and glucose diet. -Will continue to monitor.      Relevant Orders   POCT glycosylated hemoglobin (Hb A1C) (Completed)   Statin declined      No orders of the defined types were placed in this encounter.   Follow-up: Return in about 4 months (around 04/27/2020) for HTN, HLD, Pre-DM.    [name], PA-C

## 2019-12-26 NOTE — Assessment & Plan Note (Signed)
>>  ASSESSMENT AND PLAN FOR ESSENTIAL HYPERTENSION WRITTEN ON 12/26/2019  2:08 PM BY ABONZA, [name], PA-C  - BP today is 143/84 HR 89, recheck BP 114/74 - Continue Losartan 100 mg and HCTZ 12.5 mg - Continue ambulatory BP and pulse monitoring and notify the clinic if BP consistently >140/90 or <90/60. - Follow DASH diet. - Stay well hydrated, at least 64 fl oz - Encourage to stay as active as possible.

## 2019-12-26 NOTE — Assessment & Plan Note (Addendum)
-   Last lipid panel: total cholesterol and LDL elevated - Follow heart healthy diet and reduce red meat, fried foods and dairy. Handout provided. - Placed lab order to recheck lipid panel, which she wants to get done with her routine rheumatology labs.

## 2019-12-26 NOTE — Assessment & Plan Note (Addendum)
-  A1c today is 6.3, stable -Asymptomatic -Follow low carbohydrate and glucose diet. -Will continue to monitor.

## 2019-12-27 LAB — COMPLETE METABOLIC PANEL WITH GFR
AG Ratio: 1.4 (calc) (ref 1.0–2.5)
ALT: 17 U/L (ref 6–29)
AST: 20 U/L (ref 10–35)
Albumin: 4 g/dL (ref 3.6–5.1)
Alkaline phosphatase (APISO): 57 U/L (ref 37–153)
BUN/Creatinine Ratio: 18 (calc) (ref 6–22)
BUN: 18 mg/dL (ref 7–25)
CO2: 26 mmol/L (ref 20–32)
Calcium: 9.3 mg/dL (ref 8.6–10.4)
Chloride: 103 mmol/L (ref 98–110)
Creat: 0.98 mg/dL — ABNORMAL HIGH (ref 0.60–0.93)
GFR, Est African American: 64 mL/min/{1.73_m2} (ref >=60)
GFR, Est Non African American: 55 mL/min/{1.73_m2} — ABNORMAL LOW (ref >=60)
Globulin: 2.8 g/dL (calc) (ref 1.9–3.7)
Glucose, Bld: 144 mg/dL — ABNORMAL HIGH (ref 65–99)
Potassium: 3.9 mmol/L (ref 3.5–5.3)
Sodium: 139 mmol/L (ref 135–146)
Total Bilirubin: 0.7 mg/dL (ref 0.2–1.2)
Total Protein: 6.8 g/dL (ref 6.1–8.1)

## 2019-12-27 LAB — CBC WITH DIFFERENTIAL/PLATELET
Absolute Monocytes: 616 cells/uL (ref 200–950)
Basophils Absolute: 53 cells/uL (ref 0–200)
Basophils Relative: 0.7 %
Eosinophils Absolute: 228 cells/uL (ref 15–500)
Eosinophils Relative: 3 %
HCT: 42.8 % (ref 35.0–45.0)
Hemoglobin: 14.1 g/dL (ref 11.7–15.5)
Lymphs Abs: 1269 cells/uL (ref 850–3900)
MCH: 29.4 pg (ref 27.0–33.0)
MCHC: 32.9 g/dL (ref 32.0–36.0)
MCV: 89.2 fL (ref 80.0–100.0)
MPV: 10.2 fL (ref 7.5–12.5)
Monocytes Relative: 8.1 %
Neutro Abs: 5434 cells/uL (ref 1500–7800)
Neutrophils Relative %: 71.5 %
Platelets: 249 10*3/uL (ref 140–400)
RBC: 4.8 10*6/uL (ref 3.80–5.10)
RDW: 13.7 % (ref 11.0–15.0)
Total Lymphocyte: 16.7 %
WBC: 7.6 10*3/uL (ref 3.8–10.8)

## 2019-12-27 LAB — LIPID PANEL
Cholesterol: 236 mg/dL — ABNORMAL HIGH (ref <200)
HDL: 75 mg/dL (ref >=50)
LDL Cholesterol (Calc): 137 mg/dL (calc) — ABNORMAL HIGH
Non-HDL Cholesterol (Calc): 161 mg/dL (calc) — ABNORMAL HIGH (ref <130)
Total CHOL/HDL Ratio: 3.1 (calc) (ref <5.0)
Triglycerides: 122 mg/dL (ref <150)

## 2019-12-28 ENCOUNTER — Other Ambulatory Visit: Payer: Self-pay | Admitting: Pulmonary Disease

## 2019-12-28 ENCOUNTER — Other Ambulatory Visit: Payer: Self-pay | Admitting: Rheumatology

## 2019-12-28 NOTE — Telephone Encounter (Signed)
Last Visit: 09/05/2019 Next Visit: 02/06/2020 Labs: 12/26/2019 Creatinine elevated and GFR borderline low but are stable. Glucose elevated-144. Rest of CMP WNL. CBC WNL.  Current Dose per office note 09/05/2019: Methotrexate 0.3 ml every 7 days   Okay to refill MTX?

## 2020-01-01 ENCOUNTER — Other Ambulatory Visit: Payer: Self-pay

## 2020-01-01 MED ORDER — LOSARTAN POTASSIUM 100 MG PO TABS: ORAL_TABLET | ORAL | 1 refills | Status: DC

## 2020-01-16 ENCOUNTER — Other Ambulatory Visit: Payer: Self-pay | Admitting: Family Medicine

## 2020-01-16 ENCOUNTER — Other Ambulatory Visit: Payer: Self-pay | Admitting: Pulmonary Disease

## 2020-01-19 ENCOUNTER — Other Ambulatory Visit: Payer: Self-pay | Admitting: Physician Assistant

## 2020-01-19 MED ORDER — HYDROCHLOROTHIAZIDE 12.5 MG PO CAPS: 12.5000 mg | ORAL_CAPSULE | Freq: Every day | ORAL | 0 refills | Status: DC

## 2020-01-25 NOTE — Progress Notes (Signed)
Office Visit Note  Patient: Tina Hansen             Date of Birth: 07-07-1940           MRN: [id number]             PCP: [name], PA-C Referring: Esaw Grandchild, NP Visit Date: 02/06/2020 Occupation: @GUAROCC @  Subjective:  Discuss Arava   History of Present Illness: Tina Hansen is a 79 y.o. female with history of seropositive rheumatoid arthritis and osteoarthritis. She is on MTX 0.3 ml sq injections once weekly and folic acid 1 mg po daily.  She has not missed any doses of MTX.  She has not had any recent infections.  She has not received the COVID-19 vaccination and is not planning on it.  Patient reports that she is been experiencing severe pain and intermittent inflammation in both hands.  She states that she has difficulty with ADLs due to the severity of pain.  She is unable to make a complete fist in the morning.  She denies any other joint pain or inflammation at this time.  Activities of Daily Living:  Patient reports joint stiffness all day  Patient Reports nocturnal pain.  Difficulty dressing/grooming: Reports Difficulty climbing stairs: Denies Difficulty getting out of chair: Denies Difficulty using hands for taps, buttons, cutlery, and/or writing: Reports  Review of Systems  Constitutional: Negative for fatigue.  HENT: Negative for mouth sores, mouth dryness and nose dryness.   Eyes: Negative for itching and dryness.  Respiratory: Positive for shortness of breath and difficulty breathing.   Cardiovascular: Negative for chest pain and palpitations.  Gastrointestinal: Negative for blood in stool, constipation and diarrhea.  Endocrine: Negative for increased urination.  Genitourinary: Negative for difficulty urinating.  Musculoskeletal: Positive for arthralgias, joint pain, joint swelling and morning stiffness. Negative for myalgias, muscle tenderness and myalgias.  Skin: Negative for color change, rash and redness.  Allergic/Immunologic: Negative  for susceptible to infections.  Neurological: Negative for dizziness, numbness, headaches, memory loss and weakness.  Hematological: Negative for bruising/bleeding tendency.  Psychiatric/Behavioral: Negative for confusion.    PMFS History:  Patient Active Problem List   Diagnosis Date Noted  . Statin declined 08/21/2019  . Non compliance with medical treatment 08/21/2019  . Elevated LDL cholesterol level 11/07/2018  . Pre-diabetes 11/07/2018  . BMI 40.0-44.9, adult ([place]) 08/08/2018  . Onychomycosis 04/06/2018  . Decreased GFR 01/05/2018  . Healthcare maintenance 09/08/2017  . Lipoma of left upper extremity 08/09/2017  . COPD mixed type ([place]) 06/28/2017  . History of COPD 06/26/2016  . History of hypertension 06/26/2016  . Osteopenia of multiple sites 06/26/2016  . Morbid obesity ([place]) 05/04/2016  . High risk medication use 04/25/2016  . Vocal cord dysfunction 04/29/2015  . Seropositive rheumatoid arthritis of multiple sites ([place]) 04/29/2015  . Hyperlipidemia 10/06/2007  . Essential hypertension 10/06/2007  . OTHER DISEASES OF VOCAL CORDS 10/06/2007  . CERVICAL CANCER, HX OF 10/06/2007    Past Medical History:  Diagnosis Date  . Asthma   . Cervical cancer ([place])   . Emphysema lung ([place])   . Hyperlipidemia   . Hypertension   . Rheumatoid arthritis(714.0)    [name]    Family History  Problem Relation Age of Onset  . Asthma Mother   . Breast cancer Mother   . Cancer Mother   . Alcohol abuse Father   . Diabetes Son   . Cancer Maternal Uncle   . Heart attack Maternal Uncle  Past Surgical History:  Procedure Laterality Date  . ABDOMINAL HYSTERECTOMY    . BREAST SURGERY    . Breat implants    . LIPOMA EXCISION Left    shoulder   . TUBAL LIGATION    . TUMOR REMOVAL Right    FOREARM   Social History   Social History Narrative  . Not on file   Immunization History  Administered Date(s) Administered  . Pneumococcal Conjugate-13 04/06/2018  . Pneumococcal  Polysaccharide-23 06/27/2007, 03/22/2009  . Zoster Recombinat (Shingrix) 05/11/2019     Objective: Vital Signs: BP (!) 163/85 (BP Location: Left Wrist, Patient Position: Sitting, Cuff Size: Normal)   Pulse 85   Resp 18   Ht 5' 6.75" (1.695 m)   Wt 246 lb (111.6 kg)   BMI 38.82 kg/m    Physical Exam Vitals and nursing note reviewed.  Constitutional:      Appearance: She is well-developed.  HENT:     Head: Normocephalic and atraumatic.  Eyes:     Conjunctiva/sclera: Conjunctivae normal.  Pulmonary:     Effort: Pulmonary effort is normal.  Abdominal:     General: Bowel sounds are normal.     Palpations: Abdomen is soft.  Musculoskeletal:     Cervical back: Normal range of motion.  Lymphadenopathy:     Cervical: No cervical adenopathy.  Skin:    General: Skin is warm and dry.     Capillary Refill: Capillary refill takes less than 2 seconds.  Neurological:     Mental Status: She is alert and oriented to person, place, and time.  Psychiatric:        Behavior: Behavior normal.      Musculoskeletal Exam: C-spine, thoracic spine, and lumbar spine good ROM.  Shoulder joints, elbow joints, wrist joints have good ROM with no discomfort.she has tenderness and synovitis of the right fifth MCP, right second and third PIPs, left second PIP, and fifth MCP and PIP joint as described below.  Hip joints, knee joints, ankle joints, MTPs, PIPs, and DIPs good ROM with no synovitis.  No warmth or effusion of the joints noted.  No tenderness or swelling of ankle joints.  CDAI Exam: CDAI Score: 13  Patient Global: 5 mm; Provider Global: 5 mm Swollen: 6 ; Tender: 6  Joint Exam 02/06/2020      Right  Left  MCP 5  Swollen Tender  Swollen Tender  PIP 2  Swollen Tender  Swollen Tender  PIP 3  Swollen Tender     PIP 5     Swollen Tender     Investigation: No additional findings.  Imaging: XR Foot 2 Views Left  Result Date: 02/06/2020 First MTP, PIP and DIP narrowing was noted.  No  intertarsal, tibiotalar or subtalar joint space narrowing was noted.  Inferior calcaneal spur was noted. Impression: These findings are consistent with osteoarthritis of the foot.  XR Foot 2 Views Right  Result Date: 02/06/2020 First MTP, PIP and DIP narrowing was noted.  No intertarsal, tibiotalar or subtalar joint space narrowing was noted.  Inferior and posterior calcaneal spurs were noted. Impression: These findings are consistent with osteoarthritis of the foot.  XR Hand 2 View Left  Result Date: 02/06/2020 Juxta-articular osteopenia was noted.  CMC, PIP and DIP narrowing was noted.  No intercarpal radiocarpal joint space narrowing was noted.  No erosive changes were noted. Impression: These findings are consistent with rheumatoid arthritis and osteoarthritis overlap.  XR Hand 2 View Right  Result Date: 02/06/2020 Juxta-articular osteopenia  was noted.  PIP and DIP narrowing was noted.  No intercarpal radiocarpal joint space narrowing was noted.  No erosive changes were noted.  No radiographic progression was noted when compared to the x-rays of 2007. Impression: These findings are consistent with rheumatoid arthritis and osteoarthritis overlap.   Recent Labs: Lab Results  Component Value Date   WBC 7.6 12/26/2019   HGB 14.1 12/26/2019   PLT 249 12/26/2019   NA 139 12/26/2019   K 3.9 12/26/2019   CL 103 12/26/2019   CO2 26 12/26/2019   GLUCOSE 144 (H) 12/26/2019   BUN 18 12/26/2019   CREATININE 0.98 (H) 12/26/2019   BILITOT 0.7 12/26/2019   ALKPHOS 69 03/20/2019   AST 20 12/26/2019   ALT 17 12/26/2019   PROT 6.8 12/26/2019   ALBUMIN 3.9 03/20/2019   CALCIUM 9.3 12/26/2019   GFRAA 64 12/26/2019    Speciality Comments: No specialty comments available.  Procedures:  No procedures performed Allergies: Other and Theophylline   Assessment / Plan:     Visit Diagnoses: Seropositive rheumatoid arthritis of multiple sites ([place]) - +RF, +CCP, h/o elevated sed rate: She presents  today with tenderness and synovitis of several MCP joints and PIP joints as described above.  She has been experiencing recurrent rheumatoid arthritis flares.  The discomfort and stiffness has been interfering with her ADLs.  She has difficulty making a complete fist in the morning.  X-rays of both hands and feet were updated today to assess for radiographic progression.  She has been on methotrexate 0.3 mL subcutaneous injections once weekly and folic acid 1 mg by mouth daily.  She has not missed any doses of methotrexate recently.  We are unable to increase the dose of methotrexate due to elevated creatinine.  Different treatment options were discussed.  We discussed switching her from methotrexate to Arava 10 mg 1 tablet by mouth daily for 2 weeks and if labs are stable at that time she will increase to 20 mg by mouth daily.  Indications, contraindications, potential side effects of Arava were discussed today.  All questions were addressed and consent was obtained.  A prescription will be sent to the pharmacy today.  She will return for lab work in 2 weeks x 2 then 2 months then every 3 months.  Standing orders for CBC and CMP are in place.  She was advised to notify us if she cannot tolerate taking Arava.  She will follow-up in the office in 3 months to assess her response.- Plan: XR Hand 2 View Right, XR Hand 2 View Left, XR Foot 2 Views Right, XR Foot 2 Views Left  High risk medication use -She will be starting on Arava 10 mg 1 tablet by mouth daily x2 weeks and if labs are stable at that time she will increase to 20 mg by mouth daily.  She will return for lab work in 2 weeks x 2 then 2 months then every 3 months.  She has had an inadequate response to low-dose methotrexate.  We will unable to increase the dose of methotrexate due to elevated creatinine.  LFTs have been within normal limits.  CBC and CMP were drawn on 12/26/2019.    Primary osteoarthritis of both hands -She has been experiencing severe pain  and intermittent inflammation in both hands.  She has tenderness and synovitis of several MCP and PIP joints as described above.  She has been experiencing recurrent rheumatoid arthritis flares and will be switching from methotrexate to [place].  Trochanteric bursitis, right hip: Resolved   Osteopenia of multiple sites - DEXA 05/02/2019 T-score: -1.7, BMD 0.662 right femoral neck.  She continues to take a calcium and vitamin D supplement.  Vitamin D deficiency: She is taking vitamin D 1000 units daily.  Other medical conditions are listed as follows:  Pre-diabetes  History of COPD  History of hyperlipidemia  Elevated LDL cholesterol level  History of hypertension  CERVICAL CANCER, HX OF  Orders: Orders Placed This Encounter  Procedures  . XR Hand 2 View Right  . XR Hand 2 View Left  . XR Foot 2 Views Right  . XR Foot 2 Views Left   No orders of the defined types were placed in this encounter.   Face-to-face time spent with patient was 30 minutes. Greater than 50% of time was spent in counseling and coordination of care.  Follow-Up Instructions: Return in about 3 months (around 05/08/2020) for Rheumatoid arthritis, Osteoarthritis.   Ofilia Neas, PA-C  Note - This record has been created using Dragon software.  Chart creation errors have been sought, but may not always  have been located. Such creation errors do not reflect on  the standard of medical care.

## 2020-02-06 ENCOUNTER — Ambulatory Visit (INDEPENDENT_AMBULATORY_CARE_PROVIDER_SITE_OTHER): Payer: Medicare HMO

## 2020-02-06 ENCOUNTER — Encounter: Payer: Self-pay | Admitting: Physician Assistant

## 2020-02-06 ENCOUNTER — Other Ambulatory Visit: Payer: Self-pay

## 2020-02-06 ENCOUNTER — Ambulatory Visit: Payer: Self-pay

## 2020-02-06 ENCOUNTER — Ambulatory Visit: Payer: Medicare HMO | Admitting: Physician Assistant

## 2020-02-06 VITALS — BP 163/85 | HR 85 | Resp 18 | Ht 66.75 in | Wt 246.0 lb

## 2020-02-06 DIAGNOSIS — Z8541 Personal history of malignant neoplasm of cervix uteri: Secondary | ICD-10-CM

## 2020-02-06 DIAGNOSIS — Z8639 Personal history of other endocrine, nutritional and metabolic disease: Secondary | ICD-10-CM

## 2020-02-06 DIAGNOSIS — E7800 Pure hypercholesterolemia, unspecified: Secondary | ICD-10-CM

## 2020-02-06 DIAGNOSIS — R7303 Prediabetes: Secondary | ICD-10-CM | POA: Diagnosis not present

## 2020-02-06 DIAGNOSIS — M0579 Rheumatoid arthritis with rheumatoid factor of multiple sites without organ or systems involvement: Secondary | ICD-10-CM

## 2020-02-06 DIAGNOSIS — Z8709 Personal history of other diseases of the respiratory system: Secondary | ICD-10-CM

## 2020-02-06 DIAGNOSIS — M19041 Primary osteoarthritis, right hand: Secondary | ICD-10-CM | POA: Diagnosis not present

## 2020-02-06 DIAGNOSIS — M8589 Other specified disorders of bone density and structure, multiple sites: Secondary | ICD-10-CM

## 2020-02-06 DIAGNOSIS — Z79899 Other long term (current) drug therapy: Secondary | ICD-10-CM

## 2020-02-06 DIAGNOSIS — Z8679 Personal history of other diseases of the circulatory system: Secondary | ICD-10-CM

## 2020-02-06 DIAGNOSIS — E559 Vitamin D deficiency, unspecified: Secondary | ICD-10-CM | POA: Diagnosis not present

## 2020-02-06 DIAGNOSIS — M7061 Trochanteric bursitis, right hip: Secondary | ICD-10-CM | POA: Diagnosis not present

## 2020-02-06 DIAGNOSIS — M19042 Primary osteoarthritis, left hand: Secondary | ICD-10-CM

## 2020-02-06 MED ORDER — LEFLUNOMIDE 10 MG PO TABS: ORAL_TABLET | ORAL | 0 refills | Status: DC

## 2020-02-06 NOTE — Patient Instructions (Addendum)
Standing Labs We placed an order today for your standing lab work.   Please have your standing labs drawn in 2 weeks x2, 2 months, then every 3 months If possible, please have your labs drawn 2 weeks prior to your appointment so that the provider can discuss your results at your appointment.  We have open lab daily Monday through Thursday from 8:30-12:30 PM and 1:30-4:30 PM and Friday from 8:30-12:30 PM and 1:30-4:00 PM at the office of Dr. Bo Merino, [facility].   Please be advised, patients with office appointments requiring lab work will take precedents over walk-in lab work.  If possible, please come for your lab work on Monday and Friday afternoons, as you may experience shorter wait times. The office is located at [address] [place], [place], [place] [address] No appointment is necessary.   Labs are drawn by [facility]. Please bring your co-pay at the time of your lab draw.  You may receive a bill from [facility] for your lab work.  If you wish to have your labs drawn at another location, please call the office 24 hours in advance to send orders.  If you have any questions regarding directions or hours of operation,  please call [phone].   As a reminder, please drink plenty of water prior to coming for your lab work. Thanks! '  COVID-19 vaccine recommendations:   COVID-19 vaccine is recommended for everyone (unless you are allergic to a vaccine component), even if you are on a medication that suppresses your immune system.   If you are on Methotrexate, Cellcept (mycophenolate), Rinvoq, Tina Hansen, and Olumiant- hold the medication for 1 week after each vaccine. Hold Methotrexate for 2 weeks after the single dose COVID-19 vaccine.   If you are on Orencia subcutaneous injection - hold medication one week prior to and one week after the first COVID-19 vaccine dose (only).   If you are on Orencia IV infusions- time vaccination administration so that the first  COVID-19 vaccination will occur four weeks after the infusion and postpone the subsequent infusion by one week.   If you are on Cyclophosphamide or Rituxan infusions please contact your doctor prior to receiving the COVID-19 vaccine.   Do not take Tylenol or ant anti-inflammatory medications (NSAIDs) 24 hours prior to the COVID-19 vaccination.   There is no direct evidence about the efficacy of the COVID-19 vaccine in individuals who are on medications that suppress the immune system.   Even if you are fully vaccinated, and you are on any medications that suppress your immune system, please continue to wear a mask, maintain at least six feet social distance and practice hand hygiene.   If you develop a COVID-19 infection, please contact your PCP or our office to determine if you need antibody infusion.  The booster vaccine is now available for immunocompromised patients. It is advised that if you had Pfizer vaccine you should get Coca-Cola booster.  If you had a Moderna vaccine then you should get a Moderna booster. Johnson and Wynetta Emery does not have a booster vaccine at this time.  Please see the following web sites for updated information.   https://www.[email or web address].org/Portals/0/Files/COVID-19-Vaccination-Patient-Resources.pdf  https://www.rheumatology.org/About-Us/Newsroom/Press-Releases/ID/1159   Leflunomide tablets What is this medicine? LEFLUNOMIDE (le FLOO na mide) is for rheumatoid arthritis. This medicine may be used for other purposes; ask your health care provider or pharmacist if you have questions. COMMON BRAND NAME(S): Arava What should I tell my health care provider before I take this medicine? They need to know  if you have any of these conditions:  diabetes  have a fever or infection  high blood pressure  immune system problems  kidney disease  liver disease  low blood cell counts, like low white cell, platelet, or red cell counts  lung or breathing disease,  like asthma  recently received or scheduled to receive a vaccine  receiving treatment for cancer  skin conditions or sensitivity  tingling of the fingers or toes, or other nerve disorder  tuberculosis  an unusual or allergic reaction to leflunomide, teriflunomide, other medicines, food, dyes, or preservatives  pregnant or trying to get pregnant  breast-feeding How should I use this medicine? Take this medicine by mouth with a full glass of water. Follow the directions on the prescription label. Take your medicine at regular intervals. Do not take your medicine more often than directed. Do not stop taking except on your doctor's advice. Talk to your pediatrician regarding the use of this medicine in children. Special care may be needed. Overdosage: If you think you have taken too much of this medicine contact a poison control center or emergency room at once. NOTE: This medicine is only for you. Do not share this medicine with others. What if I miss a dose? If you miss a dose, take it as soon as you can. If it is almost time for your next dose, take only that dose. Do not take double or extra doses. What may interact with this medicine? Do not take this medicine with any of the following medications:  teriflunomide This medicine may also interact with the following medications:  alosetron  birth control pills  caffeine  cefaclor  certain medicines for diabetes like nateglinide, repaglinide, rosiglitazone, pioglitazone  certain medicines for high cholesterol like atorvastatin, pravastatin, rosuvastatin, simvastatin  charcoal  cholestyramine  ciprofloxacin  duloxetine  furosemide  ketoprofen  live virus vaccines  medicines that increase your risk for infection  methotrexate  mitoxantrone  paclitaxel  penicillin  theophylline  tizanidine  warfarin This list may not describe all possible interactions. Give your health care provider a list of all the  medicines, herbs, non-prescription drugs, or dietary supplements you use. Also tell them if you smoke, drink alcohol, or use illegal drugs. Some items may interact with your medicine. What should I watch for while using this medicine? Visit your health care provider for regular checks on your progress. Tell your doctor or health care provider if your symptoms do not start to get better or if they get worse. You may need blood work done while you are taking this medicine. This medicine may cause serious skin reactions. They can happen weeks to months after starting the medicine. Contact your health care provider right away if you notice fevers or flu-like symptoms with a rash. The rash may be red or purple and then turn into blisters or peeling of the skin. Or, you might notice a red rash with swelling of the face, lips or lymph nodes in your neck or under your arms. This medicine may stay in your body for up to 2 years after your last dose. Tell your doctor about any unusual side effects or symptoms. A medicine can be given to help lower your blood levels of this medicine more quickly. Women must use effective birth control with this medicine. There is a potential for serious side effects to an unborn child. Do not become pregnant while taking this medicine. Inform your doctor if you wish to become pregnant. This medicine remains  in your blood after you stop taking it. You must continue using effective birth control until the blood levels have been checked and they are low enough. A medicine can be given to help lower your blood levels of this medicine more quickly. Immediately talk to your doctor if you think you may be pregnant. You may need a pregnancy test. Talk to your health care provider or pharmacist for more information. You should not receive certain vaccines during your treatment and for a certain time after your treatment with this medication ends. Talk to your health care provider for more  information. What side effects may I notice from receiving this medicine? Side effects that you should report to your doctor or health care professional as soon as possible:  allergic reactions like skin rash, itching or hives, swelling of the face, lips, or tongue  breathing problems  cough  increased blood pressure  low blood counts - this medicine may decrease the number of white blood cells and platelets. You may be at increased risk for infections and bleeding.  pain, tingling, numbness in the hands or feet  rash, fever, and swollen lymph nodes  redness, blistering, peeing or loosening of the skin, including inside the mouth  signs of decreased platelets or bleeding - bruising, pinpoint red spots on the skin, black, tarry stools, blood in urine  signs of infection - fever or chills, cough, sore throat, pain or trouble passing urine  signs and symptoms of liver injury like dark yellow or brown urine; general ill feeling or flu-like symptoms; light-colored stools; loss of appetite; nausea; right upper belly pain; unusually weak or tired; yellowing of the eyes or skin  trouble passing urine or change in the amount of urine  vomiting Side effects that usually do not require medical attention (report to your doctor or health care professional if they continue or are bothersome):  diarrhea  hair thinning or loss  headache  nausea  tiredness This list may not describe all possible side effects. Call your doctor for medical advice about side effects. You may report side effects to FDA at 1-800-FDA-1088. Where should I keep my medicine? Keep out of the reach of children. Store at room temperature between 15 and 30 degrees C (59 and 86 degrees F). Protect from moisture and light. Throw away any unused medicine after the expiration date. NOTE: This sheet is a summary. It may not cover all possible information. If you have questions about this medicine, talk to your doctor,  pharmacist, or health care provider.  2020 Elsevier/Gold Standard (2018-09-09 15:06:48)

## 2020-02-08 ENCOUNTER — Telehealth: Payer: Self-pay | Admitting: Rheumatology

## 2020-02-08 NOTE — Telephone Encounter (Signed)
Attempted to contact the patient and unable to leave a message, voicemail not set up.

## 2020-02-08 NOTE — Telephone Encounter (Signed)
Patient called requesting a return call regarding her prescription of [place].  Patient states [place] told her the prescription wasn't covered under her insurance and the cost would be $200.

## 2020-02-08 NOTE — Telephone Encounter (Signed)
Patient states she has contacted her insurance company and they advised her they do cover the [place]. Patient states she will contact the pharmacy and advise them.

## 2020-02-16 ENCOUNTER — Telehealth: Payer: Self-pay | Admitting: *Deleted

## 2020-02-16 NOTE — Telephone Encounter (Signed)
Submitted a Prior Authorization request to [facility] for Leflunomide via Cover My Meds. Will update once we receive a response.

## 2020-02-16 NOTE — Telephone Encounter (Signed)
Received notification from [facility] regarding a prior authorization for Leflunomide. Authorization has been APPROVED from 06/23/2019 to 06/21/2020.

## 2020-02-27 ENCOUNTER — Other Ambulatory Visit: Payer: Self-pay

## 2020-02-27 DIAGNOSIS — Z79899 Other long term (current) drug therapy: Secondary | ICD-10-CM | POA: Diagnosis not present

## 2020-02-27 LAB — COMPLETE METABOLIC PANEL WITH GFR
AG Ratio: 1.6 (calc) (ref 1.0–2.5)
ALT: 22 U/L (ref 6–29)
AST: 22 U/L (ref 10–35)
Albumin: 4.3 g/dL (ref 3.6–5.1)
Alkaline phosphatase (APISO): 59 U/L (ref 37–153)
BUN/Creatinine Ratio: 23 (calc) — ABNORMAL HIGH (ref 6–22)
BUN: 24 mg/dL (ref 7–25)
CO2: 29 mmol/L (ref 20–32)
Calcium: 9.9 mg/dL (ref 8.6–10.4)
Chloride: 101 mmol/L (ref 98–110)
Creat: 1.06 mg/dL — ABNORMAL HIGH (ref 0.60–0.93)
GFR, Est African American: 58 mL/min/{1.73_m2} — ABNORMAL LOW (ref >=60)
GFR, Est Non African American: 50 mL/min/{1.73_m2} — ABNORMAL LOW (ref >=60)
Globulin: 2.7 g/dL (calc) (ref 1.9–3.7)
Glucose, Bld: 139 mg/dL — ABNORMAL HIGH (ref 65–99)
Potassium: 5.1 mmol/L (ref 3.5–5.3)
Sodium: 139 mmol/L (ref 135–146)
Total Bilirubin: 0.7 mg/dL (ref 0.2–1.2)
Total Protein: 7 g/dL (ref 6.1–8.1)

## 2020-02-27 LAB — CBC WITH DIFFERENTIAL/PLATELET
Absolute Monocytes: 616 cells/uL (ref 200–950)
Basophils Absolute: 62 cells/uL (ref 0–200)
Basophils Relative: 0.8 %
Eosinophils Absolute: 211 cells/uL (ref 15–500)
Eosinophils Relative: 2.7 %
HCT: 43.9 % (ref 35.0–45.0)
Hemoglobin: 14.7 g/dL (ref 11.7–15.5)
Lymphs Abs: 1069 cells/uL (ref 850–3900)
MCH: 29.9 pg (ref 27.0–33.0)
MCHC: 33.5 g/dL (ref 32.0–36.0)
MCV: 89.2 fL (ref 80.0–100.0)
MPV: 10.1 fL (ref 7.5–12.5)
Monocytes Relative: 7.9 %
Neutro Abs: 5842 cells/uL (ref 1500–7800)
Neutrophils Relative %: 74.9 %
Platelets: 256 10*3/uL (ref 140–400)
RBC: 4.92 10*6/uL (ref 3.80–5.10)
RDW: 13.5 % (ref 11.0–15.0)
Total Lymphocyte: 13.7 %
WBC: 7.8 10*3/uL (ref 3.8–10.8)

## 2020-02-28 NOTE — Progress Notes (Signed)
Glucose is mildly elevated.  GFR is low most likely due to the use of HCTZ .

## 2020-03-04 ENCOUNTER — Other Ambulatory Visit: Payer: Self-pay | Admitting: Pulmonary Disease

## 2020-03-12 ENCOUNTER — Telehealth: Payer: Self-pay | Admitting: Pulmonary Disease

## 2020-03-12 ENCOUNTER — Other Ambulatory Visit: Payer: Self-pay

## 2020-03-12 DIAGNOSIS — Z79899 Other long term (current) drug therapy: Secondary | ICD-10-CM

## 2020-03-12 LAB — CBC WITH DIFFERENTIAL/PLATELET
Absolute Monocytes: 632 cells/uL (ref 200–950)
Basophils Absolute: 48 cells/uL (ref 0–200)
Basophils Relative: 0.7 %
Eosinophils Absolute: 245 cells/uL (ref 15–500)
Eosinophils Relative: 3.6 %
HCT: 44.8 % (ref 35.0–45.0)
Hemoglobin: 14.8 g/dL (ref 11.7–15.5)
Lymphs Abs: 1367 cells/uL (ref 850–3900)
MCH: 29.4 pg (ref 27.0–33.0)
MCHC: 33 g/dL (ref 32.0–36.0)
MCV: 89.1 fL (ref 80.0–100.0)
MPV: 10.2 fL (ref 7.5–12.5)
Monocytes Relative: 9.3 %
Neutro Abs: 4508 cells/uL (ref 1500–7800)
Neutrophils Relative %: 66.3 %
Platelets: 256 10*3/uL (ref 140–400)
RBC: 5.03 10*6/uL (ref 3.80–5.10)
RDW: 13.1 % (ref 11.0–15.0)
Total Lymphocyte: 20.1 %
WBC: 6.8 10*3/uL (ref 3.8–10.8)

## 2020-03-12 LAB — COMPLETE METABOLIC PANEL WITH GFR
AG Ratio: 1.4 (calc) (ref 1.0–2.5)
ALT: 18 U/L (ref 6–29)
AST: 18 U/L (ref 10–35)
Albumin: 4.1 g/dL (ref 3.6–5.1)
Alkaline phosphatase (APISO): 60 U/L (ref 37–153)
BUN/Creatinine Ratio: 24 (calc) — ABNORMAL HIGH (ref 6–22)
BUN: 26 mg/dL — ABNORMAL HIGH (ref 7–25)
CO2: 26 mmol/L (ref 20–32)
Calcium: 9.6 mg/dL (ref 8.6–10.4)
Chloride: 102 mmol/L (ref 98–110)
Creat: 1.1 mg/dL — ABNORMAL HIGH (ref 0.60–0.93)
GFR, Est African American: 55 mL/min/{1.73_m2} — ABNORMAL LOW (ref >=60)
GFR, Est Non African American: 48 mL/min/{1.73_m2} — ABNORMAL LOW (ref >=60)
Globulin: 2.9 g/dL (calc) (ref 1.9–3.7)
Glucose, Bld: 135 mg/dL — ABNORMAL HIGH (ref 65–99)
Potassium: 4.7 mmol/L (ref 3.5–5.3)
Sodium: 140 mmol/L (ref 135–146)
Total Bilirubin: 0.6 mg/dL (ref 0.2–1.2)
Total Protein: 7 g/dL (ref 6.1–8.1)

## 2020-03-12 NOTE — Telephone Encounter (Signed)
Pt is due for an appt as she was last seen by Dr. [name] 05/29/19 and told to f/u in 6 months. Attempted to call pt but unable to reach and unable to leave VM due to mailbox not being set up. Will try to call back later.

## 2020-03-13 ENCOUNTER — Telehealth: Payer: Self-pay | Admitting: Physician Assistant

## 2020-03-13 NOTE — Progress Notes (Signed)
CBC is normal, glucose is mildly elevated, creatinine is elevated most likely due to HCTZ use.

## 2020-03-13 NOTE — Telephone Encounter (Signed)
Called and spoke with pt. I have scheduled pt a f/u with Dr. [name]. Nothing further needed.

## 2020-03-14 NOTE — Telephone Encounter (Signed)
Patient is aware of lab results and verbalized understanding. She is going to stop HCTZ. Patient states that she will have repeat labs done at Dr. Dora Sims office. AS, CMA

## 2020-03-14 NOTE — Telephone Encounter (Signed)
-----   Message from [name], [place] sent at 03/13/2020  4:44 PM EDT ----- Regarding: HCTZ Please call Ms. Tina Hansen and notify Dr. [name] has forwarded me most recent labs and her kidney function continues to decline so I want to stop the diuretic- HCTZ and recommend rechecking kidney function in 2 weeks. Recommend to check BP and pulse daily and keep a log. If BP starts to increase and is consistently >140/90 then will add a different medication to her regimen such as amlodipine.   Thank you, Herb Grays  ----- Message ----- From: Carole Binning, LPN Sent: 3/79/5583   9:35 AM EDT To: [name], PA-C

## 2020-03-15 ENCOUNTER — Other Ambulatory Visit: Payer: Self-pay

## 2020-03-15 ENCOUNTER — Encounter: Payer: Self-pay | Admitting: Pulmonary Disease

## 2020-03-15 ENCOUNTER — Ambulatory Visit: Payer: Medicare HMO | Admitting: Pulmonary Disease

## 2020-03-15 ENCOUNTER — Ambulatory Visit (INDEPENDENT_AMBULATORY_CARE_PROVIDER_SITE_OTHER): Payer: Medicare HMO

## 2020-03-15 VITALS — BP 128/82 | HR 86 | Temp 97.6°F | Ht 67.0 in | Wt 249.8 lb

## 2020-03-15 DIAGNOSIS — R0609 Other forms of dyspnea: Secondary | ICD-10-CM

## 2020-03-15 DIAGNOSIS — R062 Wheezing: Secondary | ICD-10-CM

## 2020-03-15 DIAGNOSIS — R05 Cough: Secondary | ICD-10-CM | POA: Diagnosis not present

## 2020-03-15 DIAGNOSIS — J439 Emphysema, unspecified: Secondary | ICD-10-CM | POA: Diagnosis not present

## 2020-03-15 DIAGNOSIS — R0600 Dyspnea, unspecified: Secondary | ICD-10-CM | POA: Diagnosis not present

## 2020-03-15 DIAGNOSIS — R059 Cough, unspecified: Secondary | ICD-10-CM

## 2020-03-15 DIAGNOSIS — J449 Chronic obstructive pulmonary disease, unspecified: Secondary | ICD-10-CM | POA: Diagnosis not present

## 2020-03-15 MED ORDER — TRELEGY ELLIPTA 100-62.5-25 MCG/INH IN AEPB: 1.0000 | INHALATION_SPRAY | Freq: Every day | RESPIRATORY_TRACT | 3 refills | Status: DC

## 2020-03-15 MED ORDER — PREDNISONE 10 MG PO TABS: 40.0000 mg | ORAL_TABLET | Freq: Every day | ORAL | 0 refills | Status: DC

## 2020-03-15 NOTE — Progress Notes (Addendum)
Tina Hansen    [id number]    September 13, 1940  Primary Care Physician:Abonza, Samantha Crimes  Referring Physician: [name], PA-C [place],  [place] [address]  Chief complaint: Follow-up for COPD  HPI: 79 year old with vocal cord dysfunction, obesity, COPD, rheumatoid arthritis Previously followed with Dr. [name].  Maintained on Symbicort for several years with stable symptoms Has chronic dyspnea on exertion.  Denies any cough, sputum production  Follows with Dr. [name] for seronegative rheumatoid arthritis and is on methotrexate therapy.  Pets: No pets Occupation: Retired Geophysicist/field seismologist Exposures: No known exposures, no mold, hot tub, Jacuzzi Smoking history: 30-pack-year smoker.  Quit in 1987 Travel history: No significant travel history Relevant family history: No significant family history of lung disease  Interval history: Methotrexate stopped and she was changed to [place] by rheumatology in August 2021.  She is tolerating it well so far  Complains of increasing cough, congestion, wheezing for the past week.  Denies any fevers, chills  Outpatient Encounter Medications as of 03/15/2020  Medication Sig  . albuterol (PROVENTIL) (2.5 MG/3ML) 0.083% nebulizer solution USE ONE VIAL IN NEBULIZER EVERY 6 HOURS AS NEEDED  . albuterol (VENTOLIN HFA) 108 (90 Base) MCG/ACT inhaler INHALE 1 TO 2 PUFFS BY MOUTH EVERY 6 HOURS AS NEEDED FOR WHEEZING AND FOR SHORTNESS OF BREATH  . Ascorbic Acid (VITAMIN C PO) Take by mouth daily.  . Biotin 5000 MCG CAPS Take 10,000 mcg by mouth daily.  . calcium gluconate 500 MG tablet Take 500 mg by mouth daily.    . Cholecalciferol (VITAMIN D3) 1000 units CAPS Take 1 capsule by mouth daily.   . Chromium 1 MG CAPS Take 1 mg by mouth daily. Increase to 2 caps if needed  . Coenzyme Q10 (COQ-10) 100 MG CAPS Take 1 capsule by mouth daily.    Marland Kitchen ECHINACEA PO Take by mouth daily.  . fish oil-omega-3 fatty acids 1000 MG  capsule Take 2 capsules by mouth daily.    . hydrochlorothiazide (MICROZIDE) 12.5 MG capsule Take 1 capsule (12.5 mg total) by mouth daily.  Marland Kitchen leflunomide (ARAVA) 10 MG tablet Take 10mg  by mouth daily for 2 weeks, if labs are stable, increase to 20mg  by mouth daily.  Marland Kitchen losartan (COZAAR) 100 MG tablet Take 1 tablet  by mouth once daily  . LUTEIN PO Take by mouth daily.  . Methylsulfonylmethane (MSM PO) Take by mouth daily.  . Milk Thistle 200 MG CAPS Take 400 mg by mouth daily.    . Multiple Vitamin (MULTIVITAMIN) capsule Take 1 capsule by mouth daily.    . NON FORMULARY Take 1 capsule by mouth daily. Collagen capsule  . SYMBICORT 160-4.5 MCG/ACT inhaler INHALE 2 PUFFS BY MOUTH TWICE DAILY . APPOINTMENT REQUIRED FOR FUTURE REFILLS  . vitamin E (VITAMIN E) 400 UNIT capsule Take 400 Units by mouth daily.   No facility-administered encounter medications on file as of 03/15/2020.    Physical Exam: Blood pressure 128/82, pulse 86, temperature 97.6 F (36.4 C), temperature source Oral, height 5\' 7"  (1.702 m), weight 249 lb 12.8 oz (113.3 kg), SpO2 96 %. Gen:      No acute distress HEENT:  EOMI, sclera anicteric Neck:     No masses; no thyromegaly Lungs:    Clear to auscultation bilaterally; normal respiratory effort CV:         Regular rate and rhythm; no murmurs Abd:      + bowel sounds; soft, non-tender;  no palpable masses, no distension Ext:    No edema; adequate peripheral perfusion Skin:      Warm and dry; no rash Neuro: alert and oriented x 3 Psych: normal mood and affect  Data Reviewed: Imaging: Chest x-ray 06/28/2017- hyperinflated lungs.  No acute cardiopulmonary abnormality. Chest x-ray 12/29/2018-hyperinflated lungs.  No acute abnormality I have reviewed the images personally.  PFTs: 05/09/2015 FVC 2 [65%), FEV1 1 [43%], F/F 49 Severe obstruction  Assessment:  Severe COPD Has mild exacerbation.  Increasing dyspnea, wheezing, congestion Get chest x-ray Prednisone 40 mg a day  for 5 days Stop Symbicort and start Trelegy  PFTs in 3 months after she is recovered from current exacerbation  Seronegative rheumatoid arthritis No evidence of interstitial lung disease in the past She would like to avoid CT scan of the chest to limit radiation exposure.  Review chest x-ray today and PFTs when available.  If any evidence of restriction then reassess for CT scan  Health maintenance She has COVID-19 vaccine hesitancy. Discussed with her and strongly recommended her to get it  Plan/Recommendations: - Stop Symbicort, start Trelegy - Prednisone for 5 days - Chest x-ray - PFTs in 3 months  Marshell Garfinkel MD [facility] 03/15/2020, 12:11 PM  CC: [name], PA-C

## 2020-03-15 NOTE — Patient Instructions (Signed)
We will give you prednisone 40 mg a day for 5 days Stop Symbicort and we will start you on a new inhaler called Trelegy Chest x-ray today  Get PFTs in 3 months and follow-up in clinic after.

## 2020-04-05 ENCOUNTER — Other Ambulatory Visit: Payer: Self-pay | Admitting: Rheumatology

## 2020-04-05 ENCOUNTER — Telehealth: Payer: Self-pay

## 2020-04-05 NOTE — Telephone Encounter (Signed)
See rx note for details.  °

## 2020-04-05 NOTE — Telephone Encounter (Signed)
Last Visit: 02/06/2020 Next Visit: 05/07/2020 Labs: 03/12/2020 CBC is normal, glucose is mildly elevated, creatinine is elevated   Current DosSeropositive rheumatoid arthritis of multiple sitese per office note 02/06/2020: Arava 20 mg daily DX:   Okay to refill Arava?

## 2020-04-05 NOTE — Telephone Encounter (Signed)
Patient called requesting prescription refill of Leflunomide to be sent to [facility].  Patient is requesting the prescription be filled today if possible since she will be out of pills on Sunday, 04/07/20.

## 2020-04-08 NOTE — Telephone Encounter (Signed)
If she has been tolerating arava without any side effects it is ok to increase Arava to 20 mg daily.   Please send in Arava 20 mg  1 tablet by mouth daily.  Dispense 90 tablets with zero refills.  Please advise the patient to have updated lab work 2 weeks after increasing the dose of [place].

## 2020-04-08 NOTE — Telephone Encounter (Signed)
Please clarify if the patient is taking 20 mg daily and ask if she would like the 20 mg tablets instead of the 10 mg tablets.

## 2020-04-08 NOTE — Telephone Encounter (Signed)
Spoke with patient and she has been tolerating arava without any side effects.  Advised patient it is ok to increase Arava to 20 mg daily.   Prescription sent in for Arava 20 mg  1 tablet by mouth daily.  Dispense 90 tablets with zero refills.  advised the patient to have updated lab work 2 weeks after increasing the dose of [place]. Patient verbalized understanding.

## 2020-04-08 NOTE — Telephone Encounter (Signed)
Spoke with patient and she states she is currently taking Arava 10 mg daily. Patient has been on the [place] for 1 month. Do you want patient to increase to Arava 20 mg daily?

## 2020-04-08 NOTE — Telephone Encounter (Signed)
Attempted to contact the patient and left message for patient to call the office.  

## 2020-04-15 ENCOUNTER — Ambulatory Visit (INDEPENDENT_AMBULATORY_CARE_PROVIDER_SITE_OTHER): Payer: Medicare HMO | Admitting: Physician Assistant

## 2020-04-15 ENCOUNTER — Telehealth: Payer: Self-pay

## 2020-04-15 ENCOUNTER — Encounter: Payer: Self-pay | Admitting: Physician Assistant

## 2020-04-15 ENCOUNTER — Other Ambulatory Visit: Payer: Self-pay

## 2020-04-15 VITALS — BP 175/88 | HR 79 | Temp 97.8°F | Ht 67.0 in | Wt 247.0 lb

## 2020-04-15 DIAGNOSIS — I160 Hypertensive urgency: Secondary | ICD-10-CM | POA: Diagnosis not present

## 2020-04-15 DIAGNOSIS — R7303 Prediabetes: Secondary | ICD-10-CM

## 2020-04-15 MED ORDER — AMLODIPINE BESYLATE 2.5 MG PO TABS: 2.5000 mg | ORAL_TABLET | Freq: Every day | ORAL | 2 refills | Status: DC

## 2020-04-15 NOTE — Telephone Encounter (Signed)
Pt called stating that her BP was 189/94 and she had vertigo.  She states that she still has vertigo this morning and checked her BP at 7:30am and it was 189/94.  She repeated BP at 10:30am and it was 179/95 and again at 11:08AM which was 192/99. She is requesting to be seen today for "the medicine she was given in the office previously.  Advised pt that we do not have any appts available today. Repeatedly and strongly advised pt to be seen at ED immediately via EMS.  Pt denies, chest pain, shortness of breath, pain in arms, facial drooping and any problems with speech. Pt requested an RX "for that medicine".  Advised pt that when this medication is administered for a hypertensive crisis, she has to be monitored to ensure that she does not become hypotensive. Pt refuses ED visit because she is concerned about contracting COVID.  Advised pt of risks of delaying treatment including stroke, MI or sudden cardiac arrest, which pt expressed understanding of.  Pt repeatedly refused to be seen at ED.  Pt requested advice if she could take an extra BP med today.  Advised pt that Herb Grays will not instruct her to take an extra dose without being seen.  Pt again refused to be seen at ED and was given appt in office on 04/16/2020.  Charyl Bigger, CMA

## 2020-04-15 NOTE — Patient Instructions (Signed)

## 2020-04-15 NOTE — Progress Notes (Signed)
Acute Office Visit  Subjective:    Patient ID: Tina Hansen, female    DOB: Aug 07, 1940, 79 y.o.   MRN: [id number]  Chief Complaint  Patient presents with  . Hypertension    HPI Patient is in today for hypertensive urgency. Pt reports last week started noticing systolic blood pressure running in the 140s-150s and on Friday blood pressure was significantly elevated 180s-190s/90-100s with average pulse readings 66-73.  Denies headache, chest pain, palpitations, neck stiffness, vision changes, shortness of breath, increased stress or pain. Chronic lower extremity swelling has improved. Patient was started on two new medications a couple weeks ago which are Trelegy inhaler (pulmonology) and [place] (Rheumatology).  Patient reports blood pressure was staying under good control 114-120s/67-70s prior to recent increase in blood pressure.  Patient checks blood pressure with a wrist cuff.  Also reports on Friday started having vertigo where she felt her surroundings were moving. Vertigo symptoms have significantly improved.  Past Medical History:  Diagnosis Date  . Asthma   . Cervical cancer ([place])   . Emphysema lung ([place])   . Hyperlipidemia   . Hypertension   . Rheumatoid arthritis(714.0)    [name]    Past Surgical History:  Procedure Laterality Date  . ABDOMINAL HYSTERECTOMY    . BREAST SURGERY    . Breat implants    . LIPOMA EXCISION Left    shoulder   . TUBAL LIGATION    . TUMOR REMOVAL Right    FOREARM    Family History  Problem Relation Age of Onset  . Asthma Mother   . Breast cancer Mother   . Cancer Mother   . Alcohol abuse Father   . Diabetes Son   . Cancer Maternal Uncle   . Heart attack Maternal Uncle     Social History   Socioeconomic History  . Marital status: Divorced    Spouse name: Not on file  . Number of children: 1  . Years of education: Not on file  . Highest education level: Not on file  Occupational History  . Occupation: retired  Tobacco  Use  . Smoking status: Former Smoker    Packs/day: 1.00    Years: 30.00    Pack years: 30.00    Types: Cigarettes    Quit date: 06/22/1985    Years since quitting: 34.8  . Smokeless tobacco: Never Used  Vaping Use  . Vaping Use: Never used  Substance and Sexual Activity  . Alcohol use: Yes    Comment: OCCASSIONAL GLASS OF WINE 1-2 MONTHLY  . Drug use: No  . Sexual activity: Never  Other Topics Concern  . Not on file  Social History Narrative  . Not on file   Social Determinants of Health   Financial Resource Strain:   . Difficulty of Paying Living Expenses: Not on file  Food Insecurity:   . Worried About Charity fundraiser in the Last Year: Not on file  . Ran Out of Food in the Last Year: Not on file  Transportation Needs:   . Lack of Transportation (Medical): Not on file  . Lack of Transportation (Non-Medical): Not on file  Physical Activity:   . Days of Exercise per Week: Not on file  . Minutes of Exercise per Session: Not on file  Stress:   . Feeling of Stress : Not on file  Social Connections:   . Frequency of Communication with Friends and Family: Not on file  . Frequency of Social Gatherings with Friends  and Family: Not on file  . Attends Religious Services: Not on file  . Active Member of Clubs or Organizations: Not on file  . Attends Archivist Meetings: Not on file  . Marital Status: Not on file  Intimate Partner Violence:   . Fear of Current or Ex-Partner: Not on file  . Emotionally Abused: Not on file  . Physically Abused: Not on file  . Sexually Abused: Not on file    Outpatient Medications Prior to Visit  Medication Sig Dispense Refill  . albuterol (PROVENTIL) (2.5 MG/3ML) 0.083% nebulizer solution USE ONE VIAL IN NEBULIZER EVERY 6 HOURS AS NEEDED 75 mL 2  . albuterol (VENTOLIN HFA) 108 (90 Base) MCG/ACT inhaler INHALE 1 TO 2 PUFFS BY MOUTH EVERY 6 HOURS AS NEEDED FOR WHEEZING AND FOR SHORTNESS OF BREATH 18 g 3  . Ascorbic Acid (VITAMIN C  PO) Take by mouth daily.    . Biotin 5000 MCG CAPS Take 10,000 mcg by mouth daily.    . calcium gluconate 500 MG tablet Take 500 mg by mouth daily.      . Cholecalciferol (VITAMIN D3) 1000 units CAPS Take 1 capsule by mouth daily.     . Chromium 1 MG CAPS Take 1 mg by mouth daily. Increase to 2 caps if needed    . Coenzyme Q10 (COQ-10) 100 MG CAPS Take 1 capsule by mouth daily.      Marland Kitchen ECHINACEA PO Take by mouth daily.    . fish oil-omega-3 fatty acids 1000 MG capsule Take 2 capsules by mouth daily.      . Fluticasone-Umeclidin-Vilant (TRELEGY ELLIPTA) 100-62.5-25 MCG/INH AEPB Inhale 1 puff into the lungs daily. 60 each 3  . hydrochlorothiazide (MICROZIDE) 12.5 MG capsule Take 1 capsule (12.5 mg total) by mouth daily. 90 capsule 0  . leflunomide (ARAVA) 20 MG tablet Take 1 tablet (20 mg total) by mouth daily. 90 tablet 0  . losartan (COZAAR) 100 MG tablet Take 1 tablet  by mouth once daily 90 tablet 1  . LUTEIN PO Take by mouth daily.    . Methylsulfonylmethane (MSM PO) Take by mouth daily.    . Milk Thistle 200 MG CAPS Take 400 mg by mouth daily.      . Multiple Vitamin (MULTIVITAMIN) capsule Take 1 capsule by mouth daily.      . NON FORMULARY Take 1 capsule by mouth daily. Collagen capsule    . predniSONE (DELTASONE) 10 MG tablet Take 4 tablets (40 mg total) by mouth daily with breakfast. 5 tablet 0  . SYMBICORT 160-4.5 MCG/ACT inhaler INHALE 2 PUFFS BY MOUTH TWICE DAILY . APPOINTMENT REQUIRED FOR FUTURE REFILLS 11 g 1  . vitamin E (VITAMIN E) 400 UNIT capsule Take 400 Units by mouth daily.     No facility-administered medications prior to visit.    Allergies  Allergen Reactions  . Other     Lamisil, vinyl or polyvinyl chloride in shoes and leather gloves   . Theophylline     REACTION: fever    Review of Systems A fourteen system review of systems was performed and found to be positive as per HPI.    Objective:    Physical Exam General:  Well Developed, well nourished,  appropriate for stated age.  Neuro:  Alert and oriented,  extra-ocular muscles intact, PERRL, no focal deficits  HEENT:  Normocephalic, atraumatic, neck supple Skin:  no gross rash, warm, pink. Cardiac:  RRR, S1 S2, no murmur Respiratory:  ECTA B/L and A/P,  Not using accessory muscles, speaking in full sentences- unlabored. Vascular:  Ext warm, no cyanosis apprec.; cap RF less 2 sec. Mild non-pitting edema. Psych:  No HI/SI, judgement and insight good, Euthymic mood. Full Affect.   BP (!) 175/88   Pulse 79   Temp 97.8 F (36.6 C) (Oral)   Ht 5' 7" (1.702 m)   Wt 247 lb (112 kg)   SpO2 96% Comment: on RA  BMI 38.69 kg/m  Wt Readings from Last 3 Encounters:  04/15/20 247 lb (112 kg)  03/15/20 249 lb 12.8 oz (113.3 kg)  02/06/20 246 lb (111.6 kg)    Health Maintenance Due  Topic Date Due  . Hepatitis C Screening  Never done  . COVID-19 Vaccine (1) Never done  . TETANUS/TDAP  Never done    There are no preventive care reminders to display for this patient.   Lab Results  Component Value Date   TSH 4.560 (H) 03/20/2019   Lab Results  Component Value Date   WBC 6.8 03/12/2020   HGB 14.8 03/12/2020   HCT 44.8 03/12/2020   MCV 89.1 03/12/2020   PLT 256 03/12/2020   Lab Results  Component Value Date   NA 140 03/12/2020   K 4.7 03/12/2020   CO2 26 03/12/2020   GLUCOSE 135 (H) 03/12/2020   BUN 26 (H) 03/12/2020   CREATININE 1.10 (H) 03/12/2020   BILITOT 0.6 03/12/2020   ALKPHOS 69 03/20/2019   AST 18 03/12/2020   ALT 18 03/12/2020   PROT 7.0 03/12/2020   ALBUMIN 3.9 03/20/2019   CALCIUM 9.6 03/12/2020   Lab Results  Component Value Date   CHOL 236 (H) 12/26/2019   Lab Results  Component Value Date   HDL 75 12/26/2019   Lab Results  Component Value Date   LDLCALC 137 (H) 12/26/2019   Lab Results  Component Value Date   TRIG 122 12/26/2019   Lab Results  Component Value Date   CHOLHDL 3.1 12/26/2019   Lab Results  Component Value Date   HGBA1C  6.3 (A) 12/26/2019       Assessment & Plan:   Problem List Items Addressed This Visit      Other   Pre-diabetes   Relevant Orders   HgB A1c    Other Visit Diagnoses    Hypertensive urgency    -  Primary   Relevant Medications   amLODipine (NORVASC) 2.5 MG tablet   Other Relevant Orders   Comp Met (CMET)   TSH   CBC w/Diff   HgB A1c     Hypertensive urgency: -Discussed with patient potential risk factors for significantly elevated blood pressure including stroke and recommend ED evaluation. Patient continues to decline due to concerns of potential exposure to Covid-19. Asymptomatic. -BP mildly improved. Will add amlodipine to current medication regimen.  -Advised to continue ambulatory BP and pulse monitoring and call Thursday with BP/pulse readings. Recommend checking with upper arm cuff vs wrist cuff for more accurate readings.  -Etiology of sudden increase in blood pressure is unclear, but possibly could be a medication side effect. Hypertension is an uncommon side effect with Arava but possible so recommend discussing with Rheumatologist- Dr. [name].  -Will also place lab orders to evaluate for other etiologies such as thyroid disorder, electrolyte imbalance, diabetes, or worsened renal function. Patient prefers to get labs done at Rheumatology office.  -Follow up as scheduled.   Meds ordered this encounter  Medications  . amLODipine (NORVASC) 2.5 MG tablet  Sig: Take 1 tablet (2.5 mg total) by mouth daily.    Dispense:  30 tablet    Refill:  2    Order Specific Question:   Supervising Provider    Answer:   Beatrice Lecher D [[id number]]   Note:  This note was prepared with assistance of Dragon voice recognition software. Occasional wrong-word or sound-a-like substitutions may have occurred due to the inherent limitations of voice recognition software.   [name], PA-C

## 2020-04-16 ENCOUNTER — Ambulatory Visit: Payer: Self-pay | Admitting: Physician Assistant

## 2020-04-22 ENCOUNTER — Telehealth: Payer: Self-pay | Admitting: Physician Assistant

## 2020-04-22 DIAGNOSIS — R42 Dizziness and giddiness: Secondary | ICD-10-CM

## 2020-04-22 MED ORDER — MECLIZINE HCL 25 MG PO TABS: 25.0000 mg | ORAL_TABLET | Freq: Every day | ORAL | 0 refills | Status: DC

## 2020-04-22 NOTE — Addendum Note (Signed)
Addended by: Mickel Crow on: 04/22/2020 01:47 PM   Modules accepted: Orders

## 2020-04-22 NOTE — Telephone Encounter (Signed)
Patient states vertigo is getting better. Patient is just now getting back due to having to get a cuff for her blood pressure checker, not sure what she means by that.

## 2020-04-22 NOTE — Telephone Encounter (Signed)
Patient states her BP machine is not working properly at this time so she has not been able to check her BP accurately. Patient states she is going today to get new device and will start to monitor BP.  Patient is taking BP meds.  Patient advised she she become symptomatic with BP (chest pain, headache, blurred vision, confusion, nausea, vomiting, SOB, etc) to go to ER for evaluation.    Patient states her vertigo is 90% better but she is still having some issues. Requesting medication to help with vertigo.  Per Herb Grays sending Meclazine 25mg  1PO QD.

## 2020-04-23 ENCOUNTER — Telehealth: Payer: Self-pay

## 2020-04-23 NOTE — Telephone Encounter (Signed)
Patient should continue to check BP and pulse at home and keep a log to bring to office visit next week. BP continues to be elevated and recommend increasing Amlodipine to 5 mg once daily. Patient can take 2 tablets of 2.5 mg.   Thank you, Herb Grays

## 2020-04-23 NOTE — Progress Notes (Signed)
Office Visit Note  Patient: Tina Hansen             Date of Birth: 11/06/1940           MRN: [id number]             PCP: [name], PA-C Referring: [name], PA-C Visit Date: 05/07/2020 Occupation: @GUAROCC @  Subjective:  Discuss side effects with arava   History of Present Illness: Tina Hansen is a 79 y.o. female with history of seropositive rheumatoid arthritis and osteoarthritis.  She is taking arava 20 mg by mouth daily.  She has been taking the full dose of arava for about 1 month.  She states she has noticed an increase her blood pressure since increasing the dose of arava.  Her PCP increased the dose of amlodipine and she has continued to take losartan as prescribed.  She has been checking her BP several times per day.  She also woke up about 3 weeks ago with new onset vertigo and has been taking meclizine as needed.  Her symptoms have slowly started to improve.  She states she has noticed a significant improvement in her joint pain, stiffness, and swelling since starting on Arava.  She is able to make a complete fist bilaterally and is not having any difficulty with ADLs at this time.      Activities of Daily Living:  Patient reports morning stiffness for    none.   Patient Denies nocturnal pain.  Difficulty dressing/grooming: Denies Difficulty climbing stairs: Reports Difficulty getting out of chair: Denies Difficulty using hands for taps, buttons, cutlery, and/or writing: Denies  Review of Systems  Constitutional: Positive for fatigue.  HENT: Positive for mouth dryness.   Eyes: Negative for dryness.  Respiratory: Positive for shortness of breath.   Cardiovascular: Positive for swelling in legs/feet.  Gastrointestinal: Negative for constipation.  Endocrine: Positive for increased urination.  Genitourinary: Negative for painful urination.  Musculoskeletal: Positive for gait problem and muscle weakness.  Skin: Negative for rash and hair loss.    Allergic/Immunologic: Negative for susceptible to infections.  Neurological: Positive for dizziness. Negative for weakness.  Hematological: Positive for bruising/bleeding tendency.  Psychiatric/Behavioral: Positive for sleep disturbance.    PMFS History:  Patient Active Problem List   Diagnosis Date Noted  . Pain 09/25/2019  . Fatigue 09/25/2019  . Statin declined 08/21/2019  . Non compliance with medical treatment 08/21/2019  . Cardiovascular disease 03/24/2019  . Elevated LDL cholesterol level 11/07/2018  . Pre-diabetes 11/07/2018  . BMI 40.0-44.9, adult ([place]) 08/08/2018  . Onychomycosis 04/06/2018  . Decreased GFR 01/05/2018  . Healthcare maintenance 09/08/2017  . Lipoma of left upper extremity 08/09/2017  . COPD mixed type ([place]) 06/28/2017  . History of COPD 06/26/2016  . History of hypertension 06/26/2016  . Osteopenia of multiple sites 06/26/2016  . Morbid obesity ([place]) 05/04/2016  . High risk medication use 04/25/2016  . Vocal cord dysfunction 04/29/2015  . Seropositive rheumatoid arthritis of multiple sites ([place]) 04/29/2015  . Hyperlipidemia 10/06/2007  . Essential hypertension 10/06/2007  . OTHER DISEASES OF VOCAL CORDS 10/06/2007  . CERVICAL CANCER, HX OF 10/06/2007    Past Medical History:  Diagnosis Date  . Asthma   . Cervical cancer ([place])   . Emphysema lung ([place])   . Hyperlipidemia   . Hypertension   . Rheumatoid arthritis(714.0)    Tina Hansen    Family History  Problem Relation Age of Onset  . Asthma Mother   . Breast cancer Mother   .  Cancer Mother   . Alcohol abuse Father   . Diabetes Son   . Cancer Maternal Uncle   . Heart attack Maternal Uncle    Past Surgical History:  Procedure Laterality Date  . ABDOMINAL HYSTERECTOMY    . BREAST SURGERY    . Breat implants    . LIPOMA EXCISION Left    shoulder   . TUBAL LIGATION    . TUMOR REMOVAL Right    FOREARM   Social History   Social History Narrative  . Not on file   Immunization  History  Administered Date(s) Administered  . Pneumococcal Conjugate-13 04/06/2018  . Pneumococcal Polysaccharide-23 06/27/2007, 03/22/2009  . Zoster Recombinat (Shingrix) 05/11/2019     Objective: Vital Signs: BP (!) 146/83 (BP Location: Left Wrist, Patient Position: Sitting, Cuff Size: Normal)   Pulse 84   Resp 18   Ht 5\' 7"  (1.702 m)   Wt 250 lb (113.4 kg)   BMI 39.16 kg/m    Physical Exam Vitals and nursing note reviewed.  Constitutional:      Appearance: She is well-developed.  HENT:     Head: Normocephalic and atraumatic.  Eyes:     Conjunctiva/sclera: Conjunctivae normal.  Pulmonary:     Effort: Pulmonary effort is normal.  Abdominal:     Palpations: Abdomen is soft.  Musculoskeletal:     Cervical back: Normal range of motion.  Skin:    General: Skin is warm and dry.     Capillary Refill: Capillary refill takes less than 2 seconds.  Neurological:     Mental Status: She is alert and oriented to person, place, and time.  Psychiatric:        Behavior: Behavior normal.      Musculoskeletal Exam: C-spine good ROM.  Shoulder joints, elbow joints, wrist joints, MCPs, PIPs, and DIPs good ROM with no synovitis.  PIP and DIP thickening consistent with osteoarthritis of both hands.  Complete fist formation bilaterally. No midline spinal tenderness or SI joint tenderness on exam.  No tenderness over trochanteric bursa bilaterally.  Knee joints good ROM with no warmth or effusion.  Ankle joints good ROM with no tenderness or inflammation.   CDAI Exam: CDAI Score: 0.4  Patient Global: 2 mm; Provider Global: 2 mm Swollen: 0 ; Tender: 0  Joint Exam 05/07/2020   No joint exam has been documented for this visit   There is currently no information documented on the homunculus. Go to the Rheumatology activity and complete the homunculus joint exam.  Investigation: No additional findings.  Imaging: No results found.  Recent Labs: Lab Results  Component Value Date   WBC  5.6 04/25/2020   HGB 15.2 04/25/2020   PLT 249 04/25/2020   NA 139 04/25/2020   K 4.8 04/25/2020   CL 103 04/25/2020   CO2 27 04/25/2020   GLUCOSE 148 (H) 04/25/2020   BUN 19 04/25/2020   CREATININE 0.93 04/25/2020   BILITOT 0.7 04/25/2020   ALKPHOS 69 03/20/2019   AST 22 04/25/2020   ALT 20 04/25/2020   PROT 6.9 04/25/2020   ALBUMIN 3.9 03/20/2019   CALCIUM 9.9 04/25/2020   GFRAA 68 04/25/2020    Speciality Comments: No specialty comments available.  Procedures:  No procedures performed Allergies: Other and Theophylline   Assessment / Plan:     Visit Diagnoses: Seropositive rheumatoid arthritis of multiple sites ([place]) - +RF, +CCP, h/o elevated sed rate: She has no joint tenderness or synovitis on exam.  She has no significant clinical  improvement since starting on Arava.  She has been on the therapeutic dose of Arava 20 mg by mouth daily for about 1 month.  The discomfort, stiffness, and swelling in both hands has improved significantly.  She currently rates her pain a 2 out of 10.  She has not had any difficulty with ADLs recently.  Since starting on [place] she has noticed an elevation in her blood pressure.  She has been checking her blood pressure several times a day and has been evaluated by her PCP.  Her dose of amlodipine was increased to twice daily and she has continued to take losartan as prescribed.  Her BP was elevated today in the office 170/90 upon arrival and when rechecked was 146/83.  We discussed that elevation in BP is a possible side effect with Arava (10% of pts).  I discussed reducing the dose of Arava to 10 mg alternating with 20 mg every other day and to monitor her blood pressure closely at home.   Dr. [name] is in agreement with the plan.  If her BP remains elevated despite the reduced dose of arava and increased dose of amlodipine in combination with losartan we will discuss further reducing the dose of arava or discuss other treatment options in the future.   She will follow up in 2 months to reassess efficacy and side effects.   High risk medication use - Arava 10 mg alternating with 20 mg every other day. She will be reducing the dose of arava due to elevation in blood pressure.  She was advised to check her BP at least twice daily and to keep a log.  She will be following up with PCP and was advised to notify us if her BP remains elevated.  She voiced understanding. Previously on low dose MTX-discontinued due to elevated creatinine.   CBC and CMP were updated on 04/25/20.  Creatinine has returned to WNL and GFR was 58. She will update CBC and CMP in 1 month then every 3 months.  Standing orders for CBC and CMP were placed today.  She has not had any recent infections. She has not received the covid-19 vaccine and does not plan to at this time.   History of hypertension: She has noticed an elevation in her blood pressure for the past 1 month.  She has been checking her blood pressure 2-3 times per day and keeping a log.  She was evaluated by her PCP who increased the dose of amlodipine to 2.5 mg 2 tablets by mouth daily and she has continued to take losartan as prescribed.  Her blood pressure was 170/90 today upon arrival and was 146/83 when rechecked at the end of the visit.  Patient reports she also had new onset vertigo about 3 weeks ago and has been taking meclizine as prescribed.  Dizziness is a rare side effect of arava.  Her symptoms of vertigo have been improving.  We discussed that hypertension is a potential side effect of arava.  Incidence of hypertension while taking arava is 10%.  It is unclear if the elevation in her BP is directly related to arava, but her BP has been more elevated since increasing the dose of arava from 10 mg to 20 mg daily about 1 month ago.  We discussed reducing the dose of arava to 10 mg alternating with 20 mg every other day and she is in agreement.  She will continue to monitor her BP closely and was advised to notify us if  it remains elevated.  She was advised to continue to take the antihypertensive medications as prescribed and continue to follow up with PCP closely.   Primary osteoarthritis of both hands: She has PIP and DIP thickening consistent with osteoarthritis of both hands.  No tenderness or inflammation was noted on exam. She is able to make a complete fist bilaterally.  She has not had any difficulty with ADLs.  Joint protection and muscle strengthening were discussed.   Trochanteric bursitis, right hip: Resolved. No tenderness to palpation on exam.  Osteopenia of multiple sites: DEXA updated on 05/02/19: right femoral neck with BMD 0.662 with T-score -1.7.  She is taking a calcium and vitamin D supplement. Repeat DEXA in November 2022.    Vitamin D deficiency: She is taking a vitamin D 1,000 unit supplement.   Other medical conditions are listed as follows:   Pre-diabetes  History of COPD  History of hyperlipidemia  Elevated LDL cholesterol level    Orders: Orders Placed This Encounter  Procedures  . COMPLETE METABOLIC PANEL WITH GFR  . CBC with Differential/Platelet   Meds ordered this encounter  Medications  . leflunomide (ARAVA) 20 MG tablet    Sig: Take 10 mg alternating with 20 mg every other day.    Dispense:  68 tablet    Refill:  0      Follow-Up Instructions: Return in about 2 months (around 07/07/2020) for Rheumatoid arthritis, Osteoarthritis.   Ofilia Neas, PA-C  Note - This record has been created using Dragon software.  Chart creation errors have been sought, but may not always  have been located. Such creation errors do not reflect on  the standard of medical care.

## 2020-04-23 NOTE — Telephone Encounter (Signed)
Pt states that she now has an arm BP cuff.  BP readings have been in the 180s/ 100s.  Please advise.  Charyl Bigger, CMA

## 2020-04-24 ENCOUNTER — Other Ambulatory Visit: Payer: Self-pay | Admitting: *Deleted

## 2020-04-24 ENCOUNTER — Telehealth: Payer: Self-pay | Admitting: Rheumatology

## 2020-04-24 DIAGNOSIS — Z79899 Other long term (current) drug therapy: Secondary | ICD-10-CM

## 2020-04-24 NOTE — Telephone Encounter (Signed)
Pt informed.  Pt expressed understanding and is agreeable.  T. Nani Ingram, CMA  

## 2020-04-24 NOTE — Telephone Encounter (Signed)
Patient going to [facility] tomorrow for lab draw. Please release orders.

## 2020-04-24 NOTE — Telephone Encounter (Signed)
Lab Orders released.  

## 2020-04-25 DIAGNOSIS — Z79899 Other long term (current) drug therapy: Secondary | ICD-10-CM | POA: Diagnosis not present

## 2020-04-26 LAB — COMPLETE METABOLIC PANEL WITH GFR
AG Ratio: 1.7 (calc) (ref 1.0–2.5)
ALT: 20 U/L (ref 6–29)
AST: 22 U/L (ref 10–35)
Albumin: 4.3 g/dL (ref 3.6–5.1)
Alkaline phosphatase (APISO): 59 U/L (ref 37–153)
BUN: 19 mg/dL (ref 7–25)
CO2: 27 mmol/L (ref 20–32)
Calcium: 9.9 mg/dL (ref 8.6–10.4)
Chloride: 103 mmol/L (ref 98–110)
Creat: 0.93 mg/dL (ref 0.60–0.93)
GFR, Est African American: 68 mL/min/{1.73_m2} (ref >=60)
GFR, Est Non African American: 58 mL/min/{1.73_m2} — ABNORMAL LOW (ref >=60)
Globulin: 2.6 g/dL (calc) (ref 1.9–3.7)
Glucose, Bld: 148 mg/dL — ABNORMAL HIGH (ref 65–99)
Potassium: 4.8 mmol/L (ref 3.5–5.3)
Sodium: 139 mmol/L (ref 135–146)
Total Bilirubin: 0.7 mg/dL (ref 0.2–1.2)
Total Protein: 6.9 g/dL (ref 6.1–8.1)

## 2020-04-26 LAB — CBC WITH DIFFERENTIAL/PLATELET
Absolute Monocytes: 734 cells/uL (ref 200–950)
Basophils Absolute: 78 cells/uL (ref 0–200)
Basophils Relative: 1.4 %
Eosinophils Absolute: 213 cells/uL (ref 15–500)
Eosinophils Relative: 3.8 %
HCT: 47.1 % — ABNORMAL HIGH (ref 35.0–45.0)
Hemoglobin: 15.2 g/dL (ref 11.7–15.5)
Lymphs Abs: 1473 cells/uL (ref 850–3900)
MCH: 28.3 pg (ref 27.0–33.0)
MCHC: 32.3 g/dL (ref 32.0–36.0)
MCV: 87.5 fL (ref 80.0–100.0)
MPV: 11 fL (ref 7.5–12.5)
Monocytes Relative: 13.1 %
Neutro Abs: 3102 cells/uL (ref 1500–7800)
Neutrophils Relative %: 55.4 %
Platelets: 249 10*3/uL (ref 140–400)
RBC: 5.38 10*6/uL — ABNORMAL HIGH (ref 3.80–5.10)
RDW: 12.7 % (ref 11.0–15.0)
Total Lymphocyte: 26.3 %
WBC: 5.6 10*3/uL (ref 3.8–10.8)

## 2020-04-26 NOTE — Progress Notes (Signed)
CBC is normal, CMP shows elevated glucose, GFR is low but stable.

## 2020-04-29 ENCOUNTER — Ambulatory Visit (INDEPENDENT_AMBULATORY_CARE_PROVIDER_SITE_OTHER): Payer: Medicare HMO | Admitting: Physician Assistant

## 2020-04-29 ENCOUNTER — Other Ambulatory Visit: Payer: Self-pay

## 2020-04-29 ENCOUNTER — Encounter: Payer: Self-pay | Admitting: Physician Assistant

## 2020-04-29 VITALS — BP 115/69 | HR 96 | Ht 67.0 in | Wt 249.3 lb

## 2020-04-29 DIAGNOSIS — I10 Essential (primary) hypertension: Secondary | ICD-10-CM | POA: Diagnosis not present

## 2020-04-29 DIAGNOSIS — R7303 Prediabetes: Secondary | ICD-10-CM | POA: Diagnosis not present

## 2020-04-29 DIAGNOSIS — R42 Dizziness and giddiness: Secondary | ICD-10-CM

## 2020-04-29 NOTE — Progress Notes (Signed)
Established Patient Office Visit  Subjective:  Patient ID: Tina Hansen, female    DOB: 11/06/1940  Age: 79 y.o. MRN: [id number]  CC:  Chief Complaint  Patient presents with  . Hypertension    HPI Tina Hansen presents for follow up on hypertension. Patient has been checking BP and pulse at home and brings BP log which range from 128-191/86-94 with average pulse 78-94. Denies chest pain, palpitations, or increased dyspnea from baseline. Lower extremity swelling remains at baseline. Tolerating amlodipine without issues. Patient continues to have episodes of dizziness with certain movements and left ear fullness with decreased hearing. Denies ear ringing.  Past Medical History:  Diagnosis Date  . Asthma   . Cervical cancer ([place])   . Emphysema lung ([place])   . Hyperlipidemia   . Hypertension   . Rheumatoid arthritis(714.0)    [name]    Past Surgical History:  Procedure Laterality Date  . ABDOMINAL HYSTERECTOMY    . BREAST SURGERY    . Breat implants    . LIPOMA EXCISION Left    shoulder   . TUBAL LIGATION    . TUMOR REMOVAL Right    FOREARM    Family History  Problem Relation Age of Onset  . Asthma Mother   . Breast cancer Mother   . Cancer Mother   . Alcohol abuse Father   . Diabetes Son   . Cancer Maternal Uncle   . Heart attack Maternal Uncle     Social History   Socioeconomic History  . Marital status: Divorced    Spouse name: Not on file  . Number of children: 1  . Years of education: Not on file  . Highest education level: Not on file  Occupational History  . Occupation: retired  Tobacco Use  . Smoking status: Former Smoker    Packs/day: 1.00    Years: 30.00    Pack years: 30.00    Types: Cigarettes    Quit date: 06/22/1985    Years since quitting: 34.8  . Smokeless tobacco: Never Used  Vaping Use  . Vaping Use: Never used  Substance and Sexual Activity  . Alcohol use: Yes    Comment: OCCASSIONAL GLASS OF WINE 1-2 MONTHLY  . Drug  use: No  . Sexual activity: Never  Other Topics Concern  . Not on file  Social History Narrative  . Not on file   Social Determinants of Health   Financial Resource Strain:   . Difficulty of Paying Living Expenses: Not on file  Food Insecurity:   . Worried About Charity fundraiser in the Last Year: Not on file  . Ran Out of Food in the Last Year: Not on file  Transportation Needs:   . Lack of Transportation (Medical): Not on file  . Lack of Transportation (Non-Medical): Not on file  Physical Activity:   . Days of Exercise per Week: Not on file  . Minutes of Exercise per Session: Not on file  Stress:   . Feeling of Stress : Not on file  Social Connections:   . Frequency of Communication with Friends and Family: Not on file  . Frequency of Social Gatherings with Friends and Family: Not on file  . Attends Religious Services: Not on file  . Active Member of Clubs or Organizations: Not on file  . Attends Archivist Meetings: Not on file  . Marital Status: Not on file  Intimate Partner Violence:   . Fear of Current or Ex-Partner:  Not on file  . Emotionally Abused: Not on file  . Physically Abused: Not on file  . Sexually Abused: Not on file    Outpatient Medications Prior to Visit  Medication Sig Dispense Refill  . albuterol (PROVENTIL) (2.5 MG/3ML) 0.083% nebulizer solution USE ONE VIAL IN NEBULIZER EVERY 6 HOURS AS NEEDED 75 mL 2  . Ascorbic Acid (VITAMIN C PO) Take by mouth daily.    . Biotin 5000 MCG CAPS Take 10,000 mcg by mouth daily.    . calcium gluconate 500 MG tablet Take 500 mg by mouth daily.      . Cholecalciferol (VITAMIN D3) 1000 units CAPS Take 1 capsule by mouth daily.     . Chromium 1 MG CAPS Take 1 mg by mouth daily. Increase to 2 caps if needed    . Coenzyme Q10 (COQ-10) 100 MG CAPS Take 1 capsule by mouth daily.      Marland Kitchen ECHINACEA PO Take by mouth daily.    . fish oil-omega-3 fatty acids 1000 MG capsule Take 2 capsules by mouth daily.      .  Fluticasone-Umeclidin-Vilant (TRELEGY ELLIPTA) 100-62.5-25 MCG/INH AEPB Inhale 1 puff into the lungs daily. 60 each 3  . leflunomide (ARAVA) 20 MG tablet Take 1 tablet (20 mg total) by mouth daily. 90 tablet 0  . losartan (COZAAR) 100 MG tablet Take 1 tablet  by mouth once daily 90 tablet 1  . LUTEIN PO Take by mouth daily.    . meclizine (ANTIVERT) 25 MG tablet Take 1 tablet (25 mg total) by mouth daily. 30 tablet 0  . Methylsulfonylmethane (MSM PO) Take by mouth daily.    . Milk Thistle 200 MG CAPS Take 400 mg by mouth daily.      . Multiple Vitamin (MULTIVITAMIN) capsule Take 1 capsule by mouth daily.      . NON FORMULARY Take 1 capsule by mouth daily. Collagen capsule    . predniSONE (DELTASONE) 10 MG tablet Take 4 tablets (40 mg total) by mouth daily with breakfast. 5 tablet 0  . SYMBICORT 160-4.5 MCG/ACT inhaler INHALE 2 PUFFS BY MOUTH TWICE DAILY . APPOINTMENT REQUIRED FOR FUTURE REFILLS 11 g 1  . vitamin E (VITAMIN E) 400 UNIT capsule Take 400 Units by mouth daily.    Marland Kitchen albuterol (VENTOLIN HFA) 108 (90 Base) MCG/ACT inhaler INHALE 1 TO 2 PUFFS BY MOUTH EVERY 6 HOURS AS NEEDED FOR WHEEZING AND FOR SHORTNESS OF BREATH 18 g 3  . amLODipine (NORVASC) 2.5 MG tablet Take 1 tablet (2.5 mg total) by mouth daily. 30 tablet 2  . hydrochlorothiazide (MICROZIDE) 12.5 MG capsule Take 1 capsule (12.5 mg total) by mouth daily. 90 capsule 0   No facility-administered medications prior to visit.    Allergies  Allergen Reactions  . Other     Lamisil, vinyl or polyvinyl chloride in shoes and leather gloves   . Theophylline     REACTION: fever    ROS Review of Systems A fourteen system review of systems was performed and found to be positive as per HPI.  Objective:    Physical Exam General:  Well Developed, well nourished, appropriate for stated age.  Neuro:  Alert and oriented,  extra-ocular muscles intact, mild nystagmus noted HEENT:  Normocephalic, atraumatic, normal external ear canals of  both ears, normal TM of both ears Skin:  no gross rash, warm, pink. Cardiac:  RRR, S1 S2, no MRG Respiratory:  ECTA B/L and A/P, Not using accessory muscles, speaking in full sentences-  unlabored. Vascular:  Ext warm, no cyanosis apprec.; cap RF less 2 sec. Psych:  No HI/SI, judgement and insight good, Euthymic mood. Full Affect.   BP 115/69   Pulse 96   Ht 5\' 7"  (1.702 m)   Wt 249 lb 4.8 oz (113.1 kg)   SpO2 95%   BMI 39.05 kg/m  Wt Readings from Last 3 Encounters:  04/29/20 249 lb 4.8 oz (113.1 kg)  04/15/20 247 lb (112 kg)  03/15/20 249 lb 12.8 oz (113.3 kg)     Health Maintenance Due  Topic Date Due  . Hepatitis C Screening  Never done  . COVID-19 Vaccine (1) Never done  . TETANUS/TDAP  Never done    There are no preventive care reminders to display for this patient.  Lab Results  Component Value Date   TSH 4.560 (H) 03/20/2019   Lab Results  Component Value Date   WBC 5.6 04/25/2020   HGB 15.2 04/25/2020   HCT 47.1 (H) 04/25/2020   MCV 87.5 04/25/2020   PLT 249 04/25/2020   Lab Results  Component Value Date   NA 139 04/25/2020   K 4.8 04/25/2020   CO2 27 04/25/2020   GLUCOSE 148 (H) 04/25/2020   BUN 19 04/25/2020   CREATININE 0.93 04/25/2020   BILITOT 0.7 04/25/2020   ALKPHOS 69 03/20/2019   AST 22 04/25/2020   ALT 20 04/25/2020   PROT 6.9 04/25/2020   ALBUMIN 3.9 03/20/2019   CALCIUM 9.9 04/25/2020   Lab Results  Component Value Date   CHOL 236 (H) 12/26/2019   Lab Results  Component Value Date   HDL 75 12/26/2019   Lab Results  Component Value Date   LDLCALC 137 (H) 12/26/2019   Lab Results  Component Value Date   TRIG 122 12/26/2019   Lab Results  Component Value Date   CHOLHDL 3.1 12/26/2019   Lab Results  Component Value Date   HGBA1C 6.3 (A) 12/26/2019      Assessment & Plan:   Problem List Items Addressed This Visit      Cardiovascular and Mediastinum   Essential hypertension - Primary   Relevant Medications    amLODipine (NORVASC) 2.5 MG tablet     Other   Pre-diabetes   Relevant Orders   POCT glycosylated hemoglobin (Hb A1C)    Other Visit Diagnoses    Vertigo         Essential hypertension: -BP today in the office significantly improved and wnl.  -Recent CMP renal function has improved, Cr 0.93 and GFR 58 -Continue current medication regimen: Losartan 100 mg and Amlodipine 5 mg. Of note, held HCTZ in 02/2020 due to decline in renal function. -Continue ambulatory BP and pulse monitoring. Recommend to bring BP device next OV for calibration.  -Follow a low sodium diet. -Will continue to monitor.  Vertigo: -Discussed with patient referral to PT for vestibular rehabilitation and/or ENT. Patient prefers to monitor symptoms and declined referral at this time. -Continue meclizine as needed for dizziness. -If symptoms fail to improve or worsen recommend specialty referral.   Prediabetes: -Recent fasting glucose elevated at 148, A1c today 6.1 which improved from prior. -Recommend to monitor carbohydrates and glucose. Stay as active as possible. -Will continue to monitor.   Meds ordered this encounter  Medications  . amLODipine (NORVASC) 2.5 MG tablet    Sig: Take 2 tablets (5 mg total) by mouth daily.    Dispense:  30 tablet    Refill:  2  Order Specific Question:   Supervising Provider    Answer:   Beatrice Lecher D [[id number]]    Follow-up: Return in about 3 months (around 07/30/2020) for HTN, HLD.   Note:  This note was prepared with assistance of Dragon voice recognition software. Occasional wrong-word or sound-a-like substitutions may have occurred due to the inherent limitations of voice recognition software.   [name], PA-C

## 2020-04-29 NOTE — Patient Instructions (Addendum)
DASH Eating Plan DASH stands for "Dietary Approaches to Stop Hypertension." The DASH eating plan is a healthy eating plan that has been shown to reduce high blood pressure (hypertension). It may also reduce your risk for type 2 diabetes, heart disease, and stroke. The DASH eating plan may also help with weight loss. What are tips for following this plan?  General guidelines  Avoid eating more than 2,300 mg (milligrams) of salt (sodium) a day. If you have hypertension, you may need to reduce your sodium intake to 1,500 mg a day.  Limit alcohol intake to no more than 1 drink a day for nonpregnant women and 2 drinks a day for men. One drink equals 12 oz of beer, 5 oz of wine, or 1 oz of hard liquor.  Work with your health care provider to maintain a healthy body weight or to lose weight. Ask what an ideal weight is for you.  Get at least 30 minutes of exercise that causes your heart to beat faster (aerobic exercise) most days of the week. Activities may include walking, swimming, or biking.  Work with your health care provider or diet and nutrition specialist (dietitian) to adjust your eating plan to your individual calorie needs. Reading food labels   Check food labels for the amount of sodium per serving. Choose foods with less than 5 percent of the Daily Value of sodium. Generally, foods with less than 300 mg of sodium per serving fit into this eating plan.  To find whole grains, look for the word "whole" as the first word in the ingredient list. Shopping  Buy products labeled as "low-sodium" or "no salt added."  Buy fresh foods. Avoid canned foods and premade or frozen meals. Cooking  Avoid adding salt when cooking. Use salt-free seasonings or herbs instead of table salt or sea salt. Check with your health care provider or pharmacist before using salt substitutes.  Do not fry foods. Cook foods using healthy methods such as baking, boiling, grilling, and broiling instead.  Cook with  heart-healthy oils, such as olive, canola, soybean, or sunflower oil. Meal planning  Eat a balanced diet that includes: ? 5 or more servings of fruits and vegetables each day. At each meal, try to fill half of your plate with fruits and vegetables. ? Up to 6-8 servings of whole grains each day. ? Less than 6 oz of lean meat, poultry, or fish each day. A 3-oz serving of meat is about the same size as a deck of cards. One egg equals 1 oz. ? 2 servings of low-fat dairy each day. ? A serving of nuts, seeds, or beans 5 times each week. ? Heart-healthy fats. Healthy fats called Omega-3 fatty acids are found in foods such as flaxseeds and coldwater fish, like sardines, salmon, and mackerel.  Limit how much you eat of the following: ? Canned or prepackaged foods. ? Food that is high in trans fat, such as fried foods. ? Food that is high in saturated fat, such as fatty meat. ? Sweets, desserts, sugary drinks, and other foods with added sugar. ? Full-fat dairy products.  Do not salt foods before eating.  Try to eat at least 2 vegetarian meals each week.  Eat more home-cooked food and less restaurant, buffet, and fast food.  When eating at a restaurant, ask that your food be prepared with less salt or no salt, if possible. What foods are recommended? The items listed may not be a complete list. Talk with your dietitian about   what dietary choices are best for you. Grains Whole-grain or whole-wheat bread. Whole-grain or whole-wheat pasta. Brown rice. Oatmeal. Quinoa. Bulgur. Whole-grain and low-sodium cereals. Pita bread. Low-fat, low-sodium crackers. Whole-wheat flour tortillas. Vegetables Fresh or frozen vegetables (raw, steamed, roasted, or grilled). Low-sodium or reduced-sodium tomato and vegetable juice. Low-sodium or reduced-sodium tomato sauce and tomato paste. Low-sodium or reduced-sodium canned vegetables. Fruits All fresh, dried, or frozen fruit. Canned fruit in natural juice (without  added sugar). Meat and other protein foods Skinless chicken or turkey. Ground chicken or turkey. Pork with fat trimmed off. Fish and seafood. Egg whites. Dried beans, peas, or lentils. Unsalted nuts, nut butters, and seeds. Unsalted canned beans. Lean cuts of beef with fat trimmed off. Low-sodium, lean deli meat. Dairy Low-fat (1%) or fat-free (skim) milk. Fat-free, low-fat, or reduced-fat cheeses. Nonfat, low-sodium ricotta or cottage cheese. Low-fat or nonfat yogurt. Low-fat, low-sodium cheese. Fats and oils Soft margarine without trans fats. Vegetable oil. Low-fat, reduced-fat, or light mayonnaise and salad dressings (reduced-sodium). Canola, safflower, olive, soybean, and sunflower oils. Avocado. Seasoning and other foods Herbs. Spices. Seasoning mixes without salt. Unsalted popcorn and pretzels. Fat-free sweets. What foods are not recommended? The items listed may not be a complete list. Talk with your dietitian about what dietary choices are best for you. Grains Baked goods made with fat, such as croissants, muffins, or some breads. Dry pasta or rice meal packs. Vegetables Creamed or fried vegetables. Vegetables in a cheese sauce. Regular canned vegetables (not low-sodium or reduced-sodium). Regular canned tomato sauce and paste (not low-sodium or reduced-sodium). Regular tomato and vegetable juice (not low-sodium or reduced-sodium). Pickles. Olives. Fruits Canned fruit in a light or heavy syrup. Fried fruit. Fruit in cream or butter sauce. Meat and other protein foods Fatty cuts of meat. Ribs. Fried meat. Bacon. Sausage. Bologna and other processed lunch meats. Salami. Fatback. Hotdogs. Bratwurst. Salted nuts and seeds. Canned beans with added salt. Canned or smoked fish. Whole eggs or egg yolks. Chicken or turkey with skin. Dairy Whole or 2% milk, cream, and half-and-half. Whole or full-fat cream cheese. Whole-fat or sweetened yogurt. Full-fat cheese. Nondairy creamers. Whipped toppings.  Processed cheese and cheese spreads. Fats and oils Butter. Stick margarine. Lard. Shortening. Ghee. Bacon fat. Tropical oils, such as coconut, palm kernel, or palm oil. Seasoning and other foods Salted popcorn and pretzels. Onion salt, garlic salt, seasoned salt, table salt, and sea salt. Worcestershire sauce. Tartar sauce. Barbecue sauce. Teriyaki sauce. Soy sauce, including reduced-sodium. Steak sauce. Canned and packaged gravies. Fish sauce. Oyster sauce. Cocktail sauce. Horseradish that you find on the shelf. Ketchup. Mustard. Meat flavorings and tenderizers. Bouillon cubes. Hot sauce and Tabasco sauce. Premade or packaged marinades. Premade or packaged taco seasonings. Relishes. Regular salad dressings. Where to find more information:  National Heart, Lung, and Blood Institute: www.nhlbi.nih.gov  American Heart Association: www.heart.org Summary  The DASH eating plan is a healthy eating plan that has been shown to reduce high blood pressure (hypertension). It may also reduce your risk for type 2 diabetes, heart disease, and stroke.  With the DASH eating plan, you should limit salt (sodium) intake to 2,300 mg a day. If you have hypertension, you may need to reduce your sodium intake to 1,500 mg a day.  When on the DASH eating plan, aim to eat more fresh fruits and vegetables, whole grains, lean proteins, low-fat dairy, and heart-healthy fats.  Work with your health care provider or diet and nutrition specialist (dietitian) to adjust your eating plan to your   individual calorie needs. This information is not intended to replace advice given to you by your health care provider. Make sure you discuss any questions you have with your health care provider. Document Revised: 05/21/2017 Document Reviewed: 06/01/2016 Elsevier Patient Education  [employer].    Vertigo Vertigo is the feeling that you or your surroundings are moving when they are not. This feeling can come and go at any  time. Vertigo often goes away on its own. Vertigo can be dangerous if it occurs while you are doing something that could endanger you or others, such as driving or operating machinery. Your health care provider will do tests to determine the cause of your vertigo. Tests will also help your health care provider decide how best to treat your condition. Follow these instructions at home: Eating and drinking      Drink enough fluid to keep your urine pale yellow.  Do not drink alcohol. Activity  Return to your normal activities as told by your health care provider. Ask your health care provider what activities are safe for you.  In the morning, first sit up on the side of the bed. When you feel okay, stand slowly while you hold onto something until you know that your balance is fine.  Move slowly. Avoid sudden body or head movements or certain positions, as told by your health care provider.  If you have trouble walking or keeping your balance, try using a cane for stability. If you feel dizzy or unstable, sit down right away.  Avoid doing any tasks that would cause danger to you or others if vertigo occurs.  Avoid bending down if you feel dizzy. Place items in your home so that they are easy for you to reach without leaning over.  Do not drive or use heavy machinery if you feel dizzy. General instructions  Take over-the-counter and prescription medicines only as told by your health care provider.  Keep all follow-up visits as told by your health care provider. This is important. Contact a health care provider if:  Your medicines do not relieve your vertigo or they make it worse.  You have a fever.  Your condition gets worse or you develop new symptoms.  Your family or friends notice any behavioral changes.  Your nausea or vomiting gets worse.  You have numbness or a prickling and tingling sensation in part of your body. Get help right away if you:  Have difficulty moving or  speaking.  Are always dizzy.  Faint.  Develop severe headaches.  Have weakness in your hands, arms, or legs.  Have changes in your hearing or vision.  Develop a stiff neck.  Develop sensitivity to light. Summary  Vertigo is the feeling that you or your surroundings are moving when they are not.  Your health care provider will do tests to determine the cause of your vertigo.  Follow instructions for home care. You may be told to avoid certain tasks, positions, or movements.  Contact a health care provider if your medicines do not relieve your symptoms, or if you have a fever, nausea, vomiting, or changes in behavior.  Get help right away if you have severe headaches or difficulty speaking, or you develop hearing or vision problems. This information is not intended to replace advice given to you by your health care provider. Make sure you discuss any questions you have with your health care provider. Document Revised: 05/02/2018 Document Reviewed: 05/02/2018 Elsevier Patient Education  2020 [employer].  How to Treat Vertigo at Home with Exercises  What is Vertigo?  Vertigo is a relatively common symptom most often associated with conditions such as sinusitis (inflammation of your sinuses due to viruses, allergies, or bacterial infections), or an inner ear infection or ear trauma.   It can be brought on by trauma (e.g. a blow to the head or whiplash) or more serious things like minor strokes.   Symptoms can also be brought on by normal degenerative changes to your inner ear that occur with aging.  The condition tends to be more commonly seen in the elderly but it can occur in all ages.    Patients most often complain of dizziness, as if the room is spinning around them.   Symptoms are provoked by quick head movements or changes in position like going from standing to lying in bed, or even turning over in bed.   It may present with nausea and/or vomiting, and can be very  debilitating to some folks.    By far the most common cause, known as Benign Paroxysmal Positional Vertigo (BPPV), is categorized by a sudden onset of symptoms, that are intense but short-lived (60 seconds or less), which is triggered by a change in head position.   Symptoms usually dissipate if you stay in one position and do not move your head.   Within the inner ear are collections of calcium carbonate crystals referred to as "otoliths" which may become dislodged from their normal position and migrate into the semicircular canals of the inner ear, throwing off your body's ability to sense where you are in space.     Fig. 921 Anatomy of the Right Osseous Labyrinth. Antonieta Iba. Anatomy of the Human Body. 1918.            What Else Could Be Behind My Vertigo?  Some other causes of vertigo include:  Meniere's disease (disorder of inner ear with ringing in ears, feeling of fullness/pressure within ear, and fluctuating hearing loss) Tumours Neurological disorders e.g. Multiple Sclerosis Motion Sickness (lack of coordination between visual stimuli, inner ear balance and positional sense) Migraine Labyrinthitis (inflammation of the fluid-filled tubes and sacs within the inner ear; may also be associated with changes in hearing) Vestibular neuritis (inflammation of the nerves associated with transmission of sensory info from the inner ear; usually of viral origins)  How it can be treated/cured? While certain medications have been prescribed for vertigo including Lorazepam your doing well 7 house the house going organizing and getting things ready for sale with the and Meclizine (for motion sickness), there exists no evidence to support a recommendation of any medication in the routine treatment of BPPV.  Clinical trials have demonstrated that repositioning techniques (listed below) are a superior option for management Otis Dials et al., 2008).    Figure above:  (A) Instructions for the modified  Epley procedure (MEP) for left ear posterior canal benign paroxysmal positional vertigo (PC-BPPV). For right ear BPPV, the procedure has to be performed in the opposite direction, starting with the head turned to the right side.  1. Start by sitting on a bed with your head turned 45 to the left. Place a pillow behind you so that on lying back it will be under your shoulders.  2. Lie back quickly with shoulders on the pillow, neck extended, and head resting on the bed. In this position, the affected (left) ear is underneath. Wait for 30 secondS.  3. Turn your head 90 to the right (without raising it),  and wait again for 30 seconds.  4. Turn your body and head another 90 to the right, and wait for another 30 seconds.  5. Sit up on the right side. This maneuver should be performed three times a day. Repeat this daily until you are free from positional vertigo for 24 hours.   (B) Instructions for the modified Semont maneuver (MSM) for left ear PC-BPPV. For right ear BPPV, the maneuver has to be performed in the opposite direction, starting with the head turned toward the left ear.  1. Sit upright on a bed with your head turned 45 toward the right ear.  2. Drop quickly to the left side, so that your head touches the bed behind your left ear. Wait 30 seconds.  3. Move head and trunk in a swift movement toward the other side without stopping in the upright position, so that your head comes to rest on the right side of your forehead. Wait again for 30 seconds.  4. Sit up again.  This maneuver should be performed three times a day. Repeat this daily until you are free from positional vertigo symptoms for 24 hours.   (   See the video in the supplementary material on the NeurologyWeb site; go to http://www.neurology.org/content/63/1/150/F1.expansion.html   )     You can also try this motion at home as well- Self-Treatment of Benign Paroxysmal Positional Vertigo Benign Paroxysmal Positioning Vertigo is  caused by loose inner ear crystals in the inner ear that migrate while sleeping to the back-bottom inner ear balance canal, the so-called "posterior semi-circular canal." The maneuver demonstrated below is the way to reposition the loose crystals so that the symptoms caused by the loose crystals go away. You may have a floating, swaying sense while walking or sitting for a few days after this procedure.

## 2020-04-30 ENCOUNTER — Other Ambulatory Visit: Payer: Self-pay | Admitting: Pulmonary Disease

## 2020-05-03 MED ORDER — AMLODIPINE BESYLATE 2.5 MG PO TABS: 5.0000 mg | ORAL_TABLET | Freq: Every day | ORAL | 2 refills | Status: DC

## 2020-05-06 LAB — POCT GLYCOSYLATED HEMOGLOBIN (HGB A1C): Hemoglobin A1C: 6.1 % — AB (ref 4.0–5.6)

## 2020-05-07 ENCOUNTER — Other Ambulatory Visit: Payer: Self-pay

## 2020-05-07 ENCOUNTER — Encounter: Payer: Self-pay | Admitting: Physician Assistant

## 2020-05-07 ENCOUNTER — Ambulatory Visit: Payer: Medicare HMO | Admitting: Physician Assistant

## 2020-05-07 VITALS — BP 146/83 | HR 84 | Resp 18 | Ht 67.0 in | Wt 250.0 lb

## 2020-05-07 DIAGNOSIS — M7061 Trochanteric bursitis, right hip: Secondary | ICD-10-CM | POA: Diagnosis not present

## 2020-05-07 DIAGNOSIS — E7800 Pure hypercholesterolemia, unspecified: Secondary | ICD-10-CM

## 2020-05-07 DIAGNOSIS — M19042 Primary osteoarthritis, left hand: Secondary | ICD-10-CM

## 2020-05-07 DIAGNOSIS — E559 Vitamin D deficiency, unspecified: Secondary | ICD-10-CM

## 2020-05-07 DIAGNOSIS — R7303 Prediabetes: Secondary | ICD-10-CM | POA: Diagnosis not present

## 2020-05-07 DIAGNOSIS — M0579 Rheumatoid arthritis with rheumatoid factor of multiple sites without organ or systems involvement: Secondary | ICD-10-CM

## 2020-05-07 DIAGNOSIS — Z8639 Personal history of other endocrine, nutritional and metabolic disease: Secondary | ICD-10-CM

## 2020-05-07 DIAGNOSIS — M19041 Primary osteoarthritis, right hand: Secondary | ICD-10-CM | POA: Diagnosis not present

## 2020-05-07 DIAGNOSIS — Z79899 Other long term (current) drug therapy: Secondary | ICD-10-CM

## 2020-05-07 DIAGNOSIS — Z8709 Personal history of other diseases of the respiratory system: Secondary | ICD-10-CM

## 2020-05-07 DIAGNOSIS — M8589 Other specified disorders of bone density and structure, multiple sites: Secondary | ICD-10-CM

## 2020-05-07 DIAGNOSIS — Z8679 Personal history of other diseases of the circulatory system: Secondary | ICD-10-CM

## 2020-05-07 MED ORDER — LEFLUNOMIDE 20 MG PO TABS: ORAL_TABLET | ORAL | 0 refills | Status: DC

## 2020-05-07 NOTE — Patient Instructions (Signed)
Standing Labs We placed an order today for your standing lab work.   Please have your standing labs drawn in December and every 3 months   If possible, please have your labs drawn 2 weeks prior to your appointment so that the provider can discuss your results at your appointment.  We have open lab daily Monday through Thursday from 8:30-12:30 PM and 1:30-4:30 PM and Friday from 8:30-12:30 PM and 1:30-4:00 PM at the office of Dr. Bo Merino, [facility].   Please be advised, patients with office appointments requiring lab work will take precedents over walk-in lab work.  If possible, please come for your lab work on Monday and Friday afternoons, as you may experience shorter wait times. The office is located at [address], [place], [place], [place] [address] No appointment is necessary.   Labs are drawn by [facility]. Please bring your co-pay at the time of your lab draw.  You may receive a bill from [facility] for your lab work.  If you wish to have your labs drawn at another location, please call the office 24 hours in advance to send orders.  If you have any questions regarding directions or hours of operation,  please call [phone].   As a reminder, please drink plenty of water prior to coming for your lab work. Thanks!

## 2020-05-13 ENCOUNTER — Other Ambulatory Visit: Payer: Self-pay | Admitting: Physician Assistant

## 2020-05-13 NOTE — Telephone Encounter (Signed)
Last Visit: 05/07/2020 Next Visit: 07/09/2020 Labs: 04/25/2020 CBC is normal, CMP shows elevated glucose, GFR is low but stable.  Current Dose per office note on 05/07/2020: Arava 10 mg alternating with 20 mg every other day.  (rx dose was updated as no print on 05/07/2020)  DX: Seropositive rheumatoid arthritis of multiple sites  Okay to refill arava?

## 2020-05-19 ENCOUNTER — Other Ambulatory Visit: Payer: Self-pay | Admitting: Physician Assistant

## 2020-06-04 ENCOUNTER — Other Ambulatory Visit (HOSPITAL_COMMUNITY)
Admission: RE | Admit: 2020-06-04 | Discharge: 2020-06-04 | Disposition: A | Discharge status: Home / Self Care | Payer: Medicare HMO | Source: Ambulatory Visit | Attending: Pulmonary Disease | Admitting: Pulmonary Disease

## 2020-06-04 DIAGNOSIS — Z01812 Encounter for preprocedural laboratory examination: Secondary | ICD-10-CM | POA: Insufficient documentation

## 2020-06-04 DIAGNOSIS — Z20822 Contact with and (suspected) exposure to covid-19: Secondary | ICD-10-CM | POA: Insufficient documentation

## 2020-06-04 LAB — SARS CORONAVIRUS 2 (TAT 6-24 HRS): SARS Coronavirus 2: NEGATIVE

## 2020-06-05 ENCOUNTER — Other Ambulatory Visit: Payer: Self-pay | Admitting: Physician Assistant

## 2020-06-05 DIAGNOSIS — I10 Essential (primary) hypertension: Secondary | ICD-10-CM

## 2020-06-07 ENCOUNTER — Encounter: Payer: Self-pay | Admitting: Pulmonary Disease

## 2020-06-07 ENCOUNTER — Other Ambulatory Visit: Payer: Self-pay

## 2020-06-07 ENCOUNTER — Ambulatory Visit: Payer: Medicare HMO | Admitting: Pulmonary Disease

## 2020-06-07 ENCOUNTER — Ambulatory Visit (INDEPENDENT_AMBULATORY_CARE_PROVIDER_SITE_OTHER): Payer: Medicare HMO | Admitting: Pulmonary Disease

## 2020-06-07 VITALS — BP 138/78 | HR 90 | Temp 97.7°F | Ht 67.0 in | Wt 249.0 lb

## 2020-06-07 DIAGNOSIS — R0600 Dyspnea, unspecified: Secondary | ICD-10-CM | POA: Diagnosis not present

## 2020-06-07 DIAGNOSIS — R0609 Other forms of dyspnea: Secondary | ICD-10-CM

## 2020-06-07 DIAGNOSIS — R062 Wheezing: Secondary | ICD-10-CM

## 2020-06-07 DIAGNOSIS — J449 Chronic obstructive pulmonary disease, unspecified: Secondary | ICD-10-CM | POA: Diagnosis not present

## 2020-06-07 DIAGNOSIS — R059 Cough, unspecified: Secondary | ICD-10-CM

## 2020-06-07 DIAGNOSIS — Z0000 Encounter for general adult medical examination without abnormal findings: Secondary | ICD-10-CM

## 2020-06-07 LAB — PULMONARY FUNCTION TEST
DL/VA % pred: 79 %
DL/VA: 4.09 ml/min/mmHg/L
DLCO cor % pred: 62 %
DLCO cor: 17.67 ml/min/mmHg
DLCO unc % pred: 65 %
DLCO unc: 18.58 ml/min/mmHg
FEF 25-75 Post: 0.55 L/sec
FEF 25-75 Pre: 0.5 L/sec
FEF2575-%Change-Post: 9 %
FEF2575-%Pred-Post: 32 %
FEF2575-%Pred-Pre: 29 %
FEV1-%Change-Post: 9 %
FEV1-%Pred-Post: 55 %
FEV1-%Pred-Pre: 51 %
FEV1-Post: 1.2 L
FEV1-Pre: 1.1 L
FEV1FVC-%Change-Post: 4 %
FEV1FVC-%Pred-Pre: 64 %
FEV6-%Change-Post: 4 %
FEV6-%Pred-Post: 79 %
FEV6-%Pred-Pre: 76 %
FEV6-Post: 2.3 L
FEV6-Pre: 2.2 L
FEV6FVC-%Change-Post: 0 %
FEV6FVC-%Pred-Post: 97 %
FEV6FVC-%Pred-Pre: 97 %
FVC-%Change-Post: 4 %
FVC-%Pred-Post: 81 %
FVC-%Pred-Pre: 78 %
FVC-Post: 2.33 L
FVC-Pre: 2.23 L
Post FEV1/FVC ratio: 52 %
Post FEV6/FVC ratio: 99 %
Pre FEV1/FVC ratio: 49 %
Pre FEV6/FVC Ratio: 99 %
RV % pred: 118 %
RV: 2.99 L
TLC % pred: 101 %
TLC: 5.6 L

## 2020-06-07 NOTE — Patient Instructions (Addendum)
You were seen today by Lauraine Rinne, NP  for:   1. COPD mixed type ([place]) 2. Dyspnea on exertion  Walk today in office   Trelegy Ellipta  >>> 1 puff daily in the morning >>>rinse mouth out after use  >>> This inhaler contains 3 medications that help manage her respiratory status, contact our office if you cannot afford this medication or unable to remain on this medication  Only use your albuterol as a rescue medication to be used if you can't catch your breath by resting or doing a relaxed purse lip breathing pattern.  - The less you use it, the better it will work when you need it. - Ok to use up to 2 puffs  every 4 hours if you must but call for immediate appointment if use goes up over your usual need - Don't leave home without it !!  (think of it like the spare tire for your car)   Note your daily symptoms > remember "red flags" for COPD:   >>>Increase in cough >>>increase in sputum production >>>increase in shortness of breath or activity  intolerance.   If you notice these symptoms, please call the office to be seen.   3. Healthcare maintenance  Would recommend COVID-19 vaccination Would recommend seasonal flu vaccine Would recommend Pneumovax 23  We can respect your decision to decline these vaccinations although this does put you risk for complications should she contract COVID-19, the flu or bacterial pneumonia   Follow Up:    Return in about 4 months (around 10/06/2020), or if symptoms worsen or fail to improve, for Follow up with Dr. [name].   Notification of test results are managed in the following manner: If there are  any recommendations or changes to the  plan of care discussed in office today,  we will contact you and let you know what they are. If you do not hear from [place], then your results are normal and you can view them through your  MyChart account , or a letter will be sent to you. Thank you again for trusting [place] with your care  - Thank you, [facility]    It is flu season:   >>> Best ways to protect herself from the flu: Receive the yearly flu vaccine, practice good hand hygiene washing with soap and also using hand sanitizer when available, eat a nutritious meals, get adequate rest, hydrate appropriately       Please contact the office if your symptoms worsen or you have concerns that you are not improving.   Thank you for choosing [facility] for your healthcare, and for allowing [place] to partner with you on your healthcare journey. I am thankful to be able to provide care to you today.   Wyn Quaker FNP-C    COPD and Physical Activity Chronic obstructive pulmonary disease (COPD) is a long-term (chronic) condition that affects the lungs. COPD is a general term that can be used to describe many different lung problems that cause lung swelling (inflammation) and limit airflow, including chronic bronchitis and emphysema. The main symptom of COPD is shortness of breath, which makes it harder to do even simple tasks. This can also make it harder to exercise and be active. Talk with your health care provider about treatments to help you breathe better and actions you can take to prevent breathing problems during physical activity. What are the benefits of exercising with COPD? Exercising regularly is an important part of a healthy  lifestyle. You can still exercise and do physical activities even though you have COPD. Exercise and physical activity improve your shortness of breath by increasing blood flow (circulation). This causes your heart to pump more oxygen through your body. Moderate exercise can improve your:  Oxygen use.  Energy level.  Shortness of breath.  Strength in your breathing muscles.  Heart health.  Sleep.  Self-esteem and feelings of self-worth.  Depression, stress, and anxiety levels. Exercise can benefit everyone with COPD. The severity of your disease may affect how hard you can exercise,  especially at first, but everyone can benefit. Talk with your health care provider about how much exercise is safe for you, and which activities and exercises are safe for you. What actions can I take to prevent breathing problems during physical activity?  Sign up for a pulmonary rehabilitation program. This type of program may include: ? Education about lung diseases. ? Exercise classes that teach you how to exercise and be more active while improving your breathing. This usually involves:  Exercise using your lower extremities, such as a stationary bicycle.  About 30 minutes of exercise, 2 to 5 times per week, for 6 to 12 weeks  Strength training, such as push ups or leg lifts. ? Nutrition education. ? Group classes in which you can talk with others who also have COPD and learn ways to manage stress.  If you use an oxygen tank, you should use it while you exercise. Work with your health care provider to adjust your oxygen for your physical activity. Your resting flow rate is different from your flow rate during physical activity.  While you are exercising: ? Take slow breaths. ? Pace yourself and do not try to go too fast. ? Purse your lips while breathing out. Pursing your lips is similar to a kissing or whistling position. ? If doing exercise that uses a quick burst of effort, such as weight lifting:  Breathe in before starting the exercise.  Breathe out during the hardest part of the exercise (such as raising the weights). Where to find support You can find support for exercising with COPD from:  Your health care provider.  A pulmonary rehabilitation program.  Your local health department or community health programs.  Support groups, online or in-person. Your health care provider may be able to recommend support groups. Where to find more information You can find more information about exercising with COPD from:  American Lung Association: ClassInsider.se.  COPD Foundation:  https://www.rivera.net/. Contact a health care provider if:  Your symptoms get worse.  You have chest pain.  You have nausea.  You have a fever.  You have trouble talking or catching your breath.  You want to start a new exercise program or a new activity. Summary  COPD is a general term that can be used to describe many different lung problems that cause lung swelling (inflammation) and limit airflow. This includes chronic bronchitis and emphysema.  Exercise and physical activity improve your shortness of breath by increasing blood flow (circulation). This causes your heart to provide more oxygen to your body.  Contact your health care provider before starting any exercise program or new activity. Ask your health care provider what exercises and activities are safe for you. This information is not intended to replace advice given to you by your health care provider. Make sure you discuss any questions you have with your health care provider. Document Revised: 09/28/2018 Document Reviewed: 07/01/2017 Elsevier Patient Education  2020 Elsevier  Inc.  

## 2020-06-07 NOTE — Progress Notes (Signed)
@Patient  ID: Tina Hansen, female    DOB: Nov 07, 1940, 79 y.o.   MRN: [id number]  Chief Complaint  Patient presents with   Follow-up    No complaints, review of PFTs    Referring provider: [name], PA-C  HPI:  79 year old female former smoker followed in our office for vocal cord dysfunction, COPD, dyspnea on exertion  PMH: Rheumatoid arthritis (managed by rheumatology Dr. [name] -on methotrexate), obesity Smoker/ Smoking History: Former smoker.  quit 1987.  30-pack-year smoking history Maintenance: Trelegy Ellipta Pt of: Dr. [name]  Pets: No pets Occupation: Retired Geophysicist/field seismologist Exposures: No known exposures, no mold, hot tub, Jacuzzi Smoking history: 30-pack-year smoker.  Quit in 1987 Travel history: No significant travel history Relevant family history: No significant family history of lung disease   06/07/2020  - Visit   79 year old female former smoker followed in our office for dyspnea on exertion, COPD, vocal cord dysfunction.  She is established with Dr. [name].  Former patient of Dr. [name].  At last office visit patient was placed on Trelegy Ellipta.  She prefers this.  She also is presenting to complete pulmonary function testing today.  Those results are listed below:  06/07/2020-pulmonary function test-FVC 2.23 (78% predicted), postbronchodilator ratio 52, postbronchodilator FEV1 1.20 (55% predicted), no bronchodilator response today, TLC 5.60 (101% 1-2), DLCO 18.58 (65% predicted) >>>Patient use Trelegy Ellipta prior to testing   Patient continues to endorse that Trelegy Ellipta has been quite helpful for her.  She is unvaccinated COVID-19, the seasonal flu vaccine as well as Pneumovax.  She declines vaccinations.  Stable walk in office today without any oxygen desaturations.  Patient leads a fairly sedentary lifestyle.  She reports that she has been thinking about starting to exercise more.  She has exercise equipment at her  house.  Questionaires / Pulmonary Flowsheets:   ACT:  No flowsheet data found.  MMRC: No flowsheet data found.  Epworth:  No flowsheet data found.  Tests:   FENO:  No results found for: NITRICOXIDE  PFT: PFT Results Latest Ref Rng & Units 06/07/2020  FVC-Pre L 2.23  FVC-Predicted Pre % 78  FVC-Post L 2.33  FVC-Predicted Post % 81  Pre FEV1/FVC % % 49  Post FEV1/FCV % % 52  FEV1-Pre L 1.10  FEV1-Predicted Pre % 51  FEV1-Post L 1.20  DLCO uncorrected ml/min/mmHg 18.58  DLCO UNC% % 65  DLCO corrected ml/min/mmHg 17.67  DLCO COR %Predicted % 62  DLVA Predicted % 79  TLC L 5.60  TLC % Predicted % 101  RV % Predicted % 118    WALK:  SIX MIN WALK 04/29/2015  Supplimental Oxygen during Test? (L/min) No    Imaging: No results found.  Lab Results:  CBC    Component Value Date/Time   WBC 5.6 04/25/2020 1355   RBC 5.38 (H) 04/25/2020 1355   HGB 15.2 04/25/2020 1355   HGB 13.9 03/20/2019 0928   HCT 47.1 (H) 04/25/2020 1355   HCT 41.4 03/20/2019 0928   PLT 249 04/25/2020 1355   PLT 224 03/20/2019 0928   MCV 87.5 04/25/2020 1355   MCV 87 03/20/2019 0928   MCH 28.3 04/25/2020 1355   MCHC 32.3 04/25/2020 1355   RDW 12.7 04/25/2020 1355   RDW 14.1 03/20/2019 0928   LYMPHSABS 1,473 04/25/2020 1355   LYMPHSABS 1.4 03/20/2019 0928   MONOABS 600 12/31/2016 1555   EOSABS 213 04/25/2020 1355   EOSABS 0.2 03/20/2019 0928   BASOSABS 78 04/25/2020 1355   BASOSABS 0.1  03/20/2019 0928    BMET    Component Value Date/Time   NA 139 04/25/2020 1355   NA 140 03/20/2019 0928   K 4.8 04/25/2020 1355   CL 103 04/25/2020 1355   CO2 27 04/25/2020 1355   GLUCOSE 148 (H) 04/25/2020 1355   BUN 19 04/25/2020 1355   BUN 16 03/20/2019 0928   CREATININE 0.93 04/25/2020 1355   CALCIUM 9.9 04/25/2020 1355   GFRNONAA 58 (L) 04/25/2020 1355   GFRAA 68 04/25/2020 1355    BNP No results found for: BNP  ProBNP No results found for: PROBNP  Specialty Problems       Pulmonary Problems   Dyspnea on exertion    Qualifier: Diagnosis of  By: [name] MD, [name]       OTHER DISEASES OF VOCAL CORDS    Qualifier: Diagnosis of  By: [name] MD, [name]       Vocal cord dysfunction   COPD mixed type ([place])      Allergies  Allergen Reactions   Other     Lamisil, vinyl or polyvinyl chloride in shoes and leather gloves    Theophylline     REACTION: fever    Immunization History  Administered Date(s) Administered   Pneumococcal Conjugate-13 04/06/2018   Pneumococcal Polysaccharide-23 06/27/2007, 03/22/2009   Zoster 05/11/2019   Zoster Recombinat (Shingrix) 05/11/2019    Past Medical History:  Diagnosis Date   Asthma    Cervical cancer ([place])    Emphysema lung ([place])    Hyperlipidemia    Hypertension    Rheumatoid arthritis(714.0)    [name]    Tobacco History: Social History   Tobacco Use  Smoking Status Former Smoker   Packs/day: 1.00   Years: 30.00   Pack years: 30.00   Types: Cigarettes   Quit date: 06/22/1985   Years since quitting: 34.9  Smokeless Tobacco Never Used   Counseling given: Not Answered   Continue to not smoke  Outpatient Encounter Medications as of 06/07/2020  Medication Sig   albuterol (PROVENTIL) (2.5 MG/3ML) 0.083% nebulizer solution USE ONE VIAL IN NEBULIZER EVERY 6 HOURS AS NEEDED   albuterol (VENTOLIN HFA) 108 (90 Base) MCG/ACT inhaler INHALE 1 TO 2 PUFFS BY MOUTH EVERY 6 HOURS AS NEEDED FOR WHEEZING AND FOR SHORTNESS OF BREATH   amLODipine (NORVASC) 2.5 MG tablet Take 1 tablet by mouth once daily   Ascorbic Acid (VITAMIN C PO) Take by mouth daily.   Biotin 5000 MCG CAPS Take 10,000 mcg by mouth daily.   calcium gluconate 500 MG tablet Take 500 mg by mouth daily.   Cholecalciferol (VITAMIN D3) 1000 units CAPS Take 1 capsule by mouth daily.    Chromium 1 MG CAPS Take 1 mg by mouth daily. Increase to 2 caps if needed   Coenzyme Q10 (COQ-10) 100 MG CAPS Take 1 capsule by mouth  daily.   ECHINACEA PO Take by mouth daily.   fish oil-omega-3 fatty acids 1000 MG capsule Take 2 capsules by mouth daily.   Fluticasone-Umeclidin-Vilant (TRELEGY ELLIPTA) 100-62.5-25 MCG/INH AEPB Inhale 1 puff into the lungs daily.   leflunomide (ARAVA) 20 MG tablet Take 10 mg alternating with 20 mg every other day.   losartan (COZAAR) 100 MG tablet Take 1 tablet by mouth once daily   LUTEIN PO Take by mouth daily.   meclizine (ANTIVERT) 25 MG tablet Take 1 tablet (25 mg total) by mouth daily.   Methylsulfonylmethane (MSM PO) Take by mouth daily.   Milk Thistle 200 MG  CAPS Take 400 mg by mouth daily.   Multiple Vitamin (MULTIVITAMIN) capsule Take 1 capsule by mouth daily.   NON FORMULARY Take 1 capsule by mouth daily. Collagen capsule   vitamin E 180 MG (400 UNITS) capsule Take 400 Units by mouth daily.   No facility-administered encounter medications on file as of 06/07/2020.     Review of Systems  Review of Systems  Constitutional: Negative for activity change, fatigue and fever.  HENT: Negative for sinus pressure, sinus pain and sore throat.   Respiratory: Positive for cough. Negative for shortness of breath and wheezing.   Cardiovascular: Negative for chest pain and palpitations.  Gastrointestinal: Negative for diarrhea, nausea and vomiting.  Musculoskeletal: Negative for arthralgias.  Neurological: Negative for dizziness.  Psychiatric/Behavioral: Negative for sleep disturbance. The patient is not nervous/anxious.      Physical Exam  BP 138/78 (BP Location: Left Arm, Cuff Size: Normal)    Pulse 90    Temp 97.7 F (36.5 C)    Ht 5\' 7"  (1.702 m)    Wt 249 lb (112.9 kg)    SpO2 94%    BMI 39.00 kg/m   Wt Readings from Last 5 Encounters:  06/07/20 249 lb (112.9 kg)  05/07/20 250 lb (113.4 kg)  04/29/20 249 lb 4.8 oz (113.1 kg)  04/15/20 247 lb (112 kg)  03/15/20 249 lb 12.8 oz (113.3 kg)    BMI Readings from Last 5 Encounters:  06/07/20 39.00 kg/m  05/07/20  39.16 kg/m  04/29/20 39.05 kg/m  04/15/20 38.69 kg/m  03/15/20 39.12 kg/m     Physical Exam Vitals and nursing note reviewed.  Constitutional:      General: She is not in acute distress.    Appearance: Normal appearance. She is obese.  HENT:     Head: Normocephalic and atraumatic.     Right Ear: External ear normal.     Left Ear: External ear normal.     Nose: Nose normal. No congestion.     Mouth/Throat:     Mouth: Mucous membranes are moist.     Pharynx: Oropharynx is clear.  Eyes:     Pupils: Pupils are equal, round, and reactive to light.  Cardiovascular:     Rate and Rhythm: Normal rate and regular rhythm.     Pulses: Normal pulses.     Heart sounds: Normal heart sounds. No murmur heard.   Pulmonary:     Effort: Pulmonary effort is normal. No respiratory distress.     Breath sounds: No decreased air movement. Wheezing present. No decreased breath sounds or rales.  Musculoskeletal:     Cervical back: Normal range of motion.  Skin:    General: Skin is warm and dry.     Capillary Refill: Capillary refill takes less than 2 seconds.  Neurological:     General: No focal deficit present.     Mental Status: She is alert and oriented to person, place, and time. Mental status is at baseline.     Gait: Gait normal.  Psychiatric:        Mood and Affect: Mood normal.        Behavior: Behavior normal.        Thought Content: Thought content normal.        Judgment: Judgment normal.       Assessment & Plan:   COPD mixed type ([place]) Plan: Continue Trelegy Ellipta Work on increasing physical activity Would recommend vaccinations: Seasonal flu, Pneumovax, COVID-19, patient declines Follow-up in 4 months  Dyspnea on exertion Plan: Walk today in office Work on increasing overall physical activity  Healthcare maintenance Patient declines all vaccinations  We recommend seasonal flu vaccine, COVID-19 vaccinations, Pneumovax 23    Return in about 4 months  (around 10/06/2020), or if symptoms worsen or fail to improve, for Follow up with Dr. [name].   Lauraine Rinne, NP 06/07/2020   This appointment required 32 minutes of patient care (this includes precharting, chart review, review of results, face-to-face care, etc.).

## 2020-06-07 NOTE — Progress Notes (Signed)
Full PFT performed today. °

## 2020-06-07 NOTE — Assessment & Plan Note (Signed)
Patient declines all vaccinations  We recommend seasonal flu vaccine, COVID-19 vaccinations, Pneumovax 23

## 2020-06-07 NOTE — Assessment & Plan Note (Signed)
Plan: Continue Trelegy Ellipta Work on increasing physical activity Would recommend vaccinations: Seasonal flu, Pneumovax, COVID-19, patient declines Follow-up in 4 months

## 2020-06-07 NOTE — Assessment & Plan Note (Signed)
Plan: Walk today in office Work on increasing overall physical activity

## 2020-06-10 ENCOUNTER — Telehealth: Payer: Self-pay | Admitting: Physician Assistant

## 2020-06-10 NOTE — Telephone Encounter (Signed)
RX was sent to [facility] on 06/05/2020.  Attempted to contact pt to advise her but VM box was not set up.  Charyl Bigger, CMA

## 2020-06-10 NOTE — Telephone Encounter (Signed)
Patient needs a refill on Norvasc and uses [facility] on [address] [place]

## 2020-06-11 NOTE — Telephone Encounter (Signed)
RX was sent to [facility] on 06/05/2020.  Attempted to contact pt to advise her but VM box was not set up.  Therefore, MyChart message sent to pt with information.  Charyl Bigger, CMA

## 2020-06-25 NOTE — Progress Notes (Deleted)
Office Visit Note  Patient: Tina Hansen             Date of Birth: 06-26-1940           MRN: [id number]             PCP: Mayer Masker, PA-C Referring: Mayer Masker, PA-C Visit Date: 07/09/2020 Occupation: @GUAROCC @  Subjective:  No chief complaint on file.   History of Present Illness: Tina Hansen is a 80 y.o. female ***   Activities of Daily Living:  Patient reports morning stiffness for *** {minute/hour:19697}.   Patient {ACTIONS;DENIES/REPORTS:21021675::"Denies"} nocturnal pain.  Difficulty dressing/grooming: {ACTIONS;DENIES/REPORTS:21021675::"Denies"} Difficulty climbing stairs: {ACTIONS;DENIES/REPORTS:21021675::"Denies"} Difficulty getting out of chair: {ACTIONS;DENIES/REPORTS:21021675::"Denies"} Difficulty using hands for taps, buttons, cutlery, and/or writing: {ACTIONS;DENIES/REPORTS:21021675::"Denies"}  No Rheumatology ROS completed.   PMFS History:  Patient Active Problem List   Diagnosis Date Noted  . Pain 09/25/2019  . Fatigue 09/25/2019  . Statin declined 08/21/2019  . Non compliance with medical treatment 08/21/2019  . Cardiovascular disease 03/24/2019  . Elevated LDL cholesterol level 11/07/2018  . Pre-diabetes 11/07/2018  . BMI 40.0-44.9, adult ([facility]) 08/08/2018  . Onychomycosis 04/06/2018  . Decreased GFR 01/05/2018  . Healthcare maintenance 09/08/2017  . Lipoma of left upper extremity 08/09/2017  . COPD mixed type ([facility]) 06/28/2017  . History of COPD 06/26/2016  . History of hypertension 06/26/2016  . Osteopenia of multiple sites 06/26/2016  . Morbid obesity ([facility]) 05/04/2016  . High risk medication use 04/25/2016  . Vocal cord dysfunction 04/29/2015  . Seropositive rheumatoid arthritis of multiple sites (H[facility]) 04/29/2015  . Hyperlipidemia 10/06/2007  . Essential hypertension 10/06/2007  . OTHER DISEASES OF VOCAL CORDS 10/06/2007  . Dyspnea on exertion 10/06/2007  . CERVICAL CANCER, HX OF 10/06/2007    Past Medical History:   Diagnosis Date  . Asthma   . Cervical cancer ([facility])   . Emphysema lung ([facility])   . Hyperlipidemia   . Hypertension   . Rheumatoid arthritis(714.0)    [name]    Family History  Problem Relation Age of Onset  . Asthma Mother   . Breast cancer Mother   . Cancer Mother   . Alcohol abuse Father   . Diabetes Son   . Cancer Maternal Uncle   . Heart attack Maternal Uncle    Past Surgical History:  Procedure Laterality Date  . ABDOMINAL HYSTERECTOMY    . BREAST SURGERY    . Breat implants    . LIPOMA EXCISION Left    shoulder   . TUBAL LIGATION    . TUMOR REMOVAL Right    FOREARM   Social History   Social History Narrative  . Not on file   Immunization History  Administered Date(s) Administered  . Pneumococcal Conjugate-13 04/06/2018  . Pneumococcal Polysaccharide-23 06/27/2007, 03/22/2009  . Zoster 05/11/2019  . Zoster Recombinat (Shingrix) 05/11/2019     Objective: Vital Signs: There were no vitals taken for this visit.   Physical Exam   Musculoskeletal Exam: ***  CDAI Exam: CDAI Score: -- Patient Global: --; Provider Global: -- Swollen: --; Tender: -- Joint Exam 07/09/2020   No joint exam has been documented for this visit   There is currently no information documented on the homunculus. Go to the Rheumatology activity and complete the homunculus joint exam.  Investigation: No additional findings.  Imaging: No results found.  Recent Labs: Lab Results  Component Value Date   WBC 5.6 04/25/2020   HGB 15.2 04/25/2020   PLT 249 04/25/2020   NA  139 04/25/2020   K 4.8 04/25/2020   CL 103 04/25/2020   CO2 27 04/25/2020   GLUCOSE 148 (H) 04/25/2020   BUN 19 04/25/2020   CREATININE 0.93 04/25/2020   BILITOT 0.7 04/25/2020   ALKPHOS 69 03/20/2019   AST 22 04/25/2020   ALT 20 04/25/2020   PROT 6.9 04/25/2020   ALBUMIN 3.9 03/20/2019   CALCIUM 9.9 04/25/2020   GFRAA 68 04/25/2020    Speciality Comments: No specialty comments  available.  Procedures:  No procedures performed Allergies: Other and Theophylline   Assessment / Plan:     Visit Diagnoses: No diagnosis found.  Orders: No orders of the defined types were placed in this encounter.  No orders of the defined types were placed in this encounter.   Face-to-face time spent with patient was *** minutes. Greater than 50% of time was spent in counseling and coordination of care.  Follow-Up Instructions: No follow-ups on file.   Earnestine Mealing, CMA  Note - This record has been created using Editor, commissioning.  Chart creation errors have been sought, but may not always  have been located. Such creation errors do not reflect on  the standard of medical care.

## 2020-07-09 ENCOUNTER — Ambulatory Visit: Payer: Medicare HMO | Admitting: Physician Assistant

## 2020-07-09 DIAGNOSIS — Z8639 Personal history of other endocrine, nutritional and metabolic disease: Secondary | ICD-10-CM

## 2020-07-09 DIAGNOSIS — Z8709 Personal history of other diseases of the respiratory system: Secondary | ICD-10-CM

## 2020-07-09 DIAGNOSIS — E7800 Pure hypercholesterolemia, unspecified: Secondary | ICD-10-CM

## 2020-07-09 DIAGNOSIS — Z8679 Personal history of other diseases of the circulatory system: Secondary | ICD-10-CM

## 2020-07-09 DIAGNOSIS — E559 Vitamin D deficiency, unspecified: Secondary | ICD-10-CM

## 2020-07-09 DIAGNOSIS — M7061 Trochanteric bursitis, right hip: Secondary | ICD-10-CM

## 2020-07-09 DIAGNOSIS — R7303 Prediabetes: Secondary | ICD-10-CM

## 2020-07-09 DIAGNOSIS — Z79899 Other long term (current) drug therapy: Secondary | ICD-10-CM

## 2020-07-09 DIAGNOSIS — M19041 Primary osteoarthritis, right hand: Secondary | ICD-10-CM

## 2020-07-09 DIAGNOSIS — M8589 Other specified disorders of bone density and structure, multiple sites: Secondary | ICD-10-CM

## 2020-07-09 DIAGNOSIS — M0579 Rheumatoid arthritis with rheumatoid factor of multiple sites without organ or systems involvement: Secondary | ICD-10-CM

## 2020-07-10 NOTE — Progress Notes (Signed)
Office Visit Note  Patient: Tina Hansen             Date of Birth: 12-Aug-1940           MRN: [id number]             PCP: [name], PA-C Referring: [name], PA-C Visit Date: 07/23/2020 Occupation: @GUAROCC @  Subjective:  Medication monitoring   History of Present Illness: Tina Hansen is a 80 y.o. female with history of seropositive rheumatoid arthritis and osteoarthritis.  She is on arava 10 mg alternating with 20 mg every other day.  She denies any recent rheumatoid arthritis flares.  She is not having any joint pain or joint swelling at this time.  She states she has occasional discomfort in her wrist joints if she carries something heavy but has not noticed any joint swelling. She reports that she has been monitoring her blood pressure closely since her last office visit on 05/07/2020.  She states that her blood pressure has been significantly better controlled when it has been checked in the doctor's office this but at home she continues to have discrepancies using her own device.  She continues to take amlodipine 2.5 mg 1 tablet by mouth daily and losartan 100 mg 1 tablet by mouth daily.  She denies any recent falls or fractures. She is taking a calcium and vitamin D supplement as recommended.      Activities of Daily Living:  Patient reports morning stiffness for 0 minutes.   Patient Denies nocturnal pain.  Difficulty dressing/grooming: Denies Difficulty climbing stairs: Reports Difficulty getting out of chair: Denies Difficulty using hands for taps, buttons, cutlery, and/or writing: Denies  Review of Systems  Constitutional: Negative for fatigue.  HENT: Positive for mouth dryness and nose dryness. Negative for mouth sores.   Eyes: Negative for pain, itching and dryness.  Respiratory: Positive for shortness of breath and difficulty breathing.   Cardiovascular: Positive for swelling in legs/feet. Negative for chest pain and palpitations.   Gastrointestinal: Positive for diarrhea. Negative for blood in stool and constipation.  Endocrine: Negative for increased urination.  Genitourinary: Positive for nocturia. Negative for difficulty urinating.  Musculoskeletal: Negative for arthralgias, joint pain, joint swelling, myalgias, morning stiffness, muscle tenderness and myalgias.  Skin: Negative for color change, rash and redness.  Allergic/Immunologic: Negative for susceptible to infections.  Neurological: Negative for dizziness, numbness, headaches, memory loss and weakness.  Hematological: Positive for bruising/bleeding tendency.  Psychiatric/Behavioral: Negative for confusion.    PMFS History:  Patient Active Problem List   Diagnosis Date Noted  . Pain 09/25/2019  . Fatigue 09/25/2019  . Statin declined 08/21/2019  . Non compliance with medical treatment 08/21/2019  . Cardiovascular disease 03/24/2019  . Elevated LDL cholesterol level 11/07/2018  . Pre-diabetes 11/07/2018  . BMI 40.0-44.9, adult ([place]) 08/08/2018  . Onychomycosis 04/06/2018  . Decreased GFR 01/05/2018  . Healthcare maintenance 09/08/2017  . Lipoma of left upper extremity 08/09/2017  . COPD mixed type ([place]) 06/28/2017  . History of COPD 06/26/2016  . History of hypertension 06/26/2016  . Osteopenia of multiple sites 06/26/2016  . Morbid obesity ([place]) 05/04/2016  . High risk medication use 04/25/2016  . Vocal cord dysfunction 04/29/2015  . Seropositive rheumatoid arthritis of multiple sites ([place]) 04/29/2015  . Hyperlipidemia 10/06/2007  . Essential hypertension 10/06/2007  . OTHER DISEASES OF VOCAL CORDS 10/06/2007  . Dyspnea on exertion 10/06/2007  . CERVICAL CANCER, HX OF 10/06/2007    Past Medical History:  Diagnosis Date  .  Asthma   . Cervical cancer ([place])   . Emphysema lung ([place])   . Hyperlipidemia   . Hypertension   . Rheumatoid arthritis(714.0)    [name]    Family History  Problem Relation Age of Onset  . Asthma Mother   .  Breast cancer Mother   . Cancer Mother   . Alcohol abuse Father   . Diabetes Son   . Cancer Maternal Uncle   . Heart attack Maternal Uncle    Past Surgical History:  Procedure Laterality Date  . ABDOMINAL HYSTERECTOMY    . BREAST SURGERY    . Breat implants    . LIPOMA EXCISION Left    shoulder   . TUBAL LIGATION    . TUMOR REMOVAL Right    FOREARM   Social History   Social History Narrative  . Not on file   Immunization History  Administered Date(s) Administered  . Pneumococcal Conjugate-13 04/06/2018  . Pneumococcal Polysaccharide-23 06/27/2007, 03/22/2009  . Zoster 05/11/2019  . Zoster Recombinat (Shingrix) 05/11/2019     Objective: Vital Signs: BP 135/78 (BP Location: Left Wrist, Patient Position: Sitting, Cuff Size: Normal)   Pulse 93   Resp 18   Ht 5' 6.5" (1.689 m)   Wt 244 lb 6.4 oz (110.9 kg)   BMI 38.86 kg/m    Physical Exam Vitals and nursing note reviewed.  Constitutional:      Appearance: She is well-developed and well-nourished.  HENT:     Head: Normocephalic and atraumatic.  Eyes:     Extraocular Movements: EOM normal.     Conjunctiva/sclera: Conjunctivae normal.  Cardiovascular:     Pulses: Intact distal pulses.  Pulmonary:     Effort: Pulmonary effort is normal.  Abdominal:     Palpations: Abdomen is soft.  Musculoskeletal:     Cervical back: Normal range of motion.  Skin:    General: Skin is warm and dry.     Capillary Refill: Capillary refill takes less than 2 seconds.  Neurological:     Mental Status: She is alert and oriented to person, place, and time.  Psychiatric:        Mood and Affect: Mood and affect normal.        Behavior: Behavior normal.      Musculoskeletal Exam: C-spine, thoracic spine, lumbar spine good range of motion with no discomfort.  Shoulders and elbow joints, wrist joints, MCPs, PIPs, DIPs have good range of motion with no synovitis.  She has PIP and DIP thickening consistent with osteoarthritis of both  hands.  She is able to make a complete fist bilaterally.  Knee joints have good range of motion with no warmth or effusion.  Ankle joints have good range of motion with no tenderness.  Pitting edema noted bilaterally.  CDAI Exam: CDAI Score: 0.4  Patient Global: 2 mm; Provider Global: 2 mm Swollen: 0 ; Tender: 0  Joint Exam 07/23/2020   No joint exam has been documented for this visit   There is currently no information documented on the homunculus. Go to the Rheumatology activity and complete the homunculus joint exam.  Investigation: No additional findings.  Imaging: No results found.  Recent Labs: Lab Results  Component Value Date   WBC 5.6 04/25/2020   HGB 15.2 04/25/2020   PLT 249 04/25/2020   NA 139 04/25/2020   K 4.8 04/25/2020   CL 103 04/25/2020   CO2 27 04/25/2020   GLUCOSE 148 (H) 04/25/2020   BUN 19 04/25/2020  CREATININE 0.93 04/25/2020   BILITOT 0.7 04/25/2020   ALKPHOS 69 03/20/2019   AST 22 04/25/2020   ALT 20 04/25/2020   PROT 6.9 04/25/2020   ALBUMIN 3.9 03/20/2019   CALCIUM 9.9 04/25/2020   GFRAA 68 04/25/2020    Speciality Comments: No specialty comments available.  Procedures:  No procedures performed Allergies: Other and Theophylline   Assessment / Plan:     Visit Diagnoses: Seropositive rheumatoid arthritis of multiple sites ([place]) - +RF, +CCP, h/o elevated sed rate: She had no joint tenderness or synovitis on examination today.  She has clinically been doing well taking Arava 10 mg alternating with 20 mg every other day.  She continues to have occasional loose stools but overall has been tolerating it better.  Her blood pressure has also improved.  She has been taking losartan and amlodipine as prescribed.  Her blood pressure was 135/78 today in the office which is a significant improvement since her last office visit.  She was encouraged to continue to monitor her blood pressure closely. She had updated x-rays of hands and feet on 02/06/2020:  Her hand x-rays were consistent with rheumatoid arthritis and osteoarthritis overlap.  She did not have any erosive changes or progression when compared to previous x-rays.  X-rays of her feet were consistent with osteoarthritis. She will continue on the current dose of [place].  She does not need a refill at this time.  She was advised to notify us if she develops increased joint pain or joint swelling.  She will follow-up in the office in 3 months.   High risk medication use - Arava 10 mg alternating with 20 mg every other day. CBC and CMP updated on 04/25/20.  She is due to update lab work.  Orders for CBC and CMP released.  Her next lab work will be due in May and every 3 months.  Standing orders for CBC and CMP are in place.    - Plan: COMPLETE METABOLIC PANEL WITH GFR, CBC with Differential/Platelet She was advised to continue to monitor her blood pressure closely at home.  Her blood pressure was 135/78 today in the office which is a significant improvement since her last office visit.  She has an upcoming appointment with her PCP to evaluate her hypertension.  She has been taking losartan and amlodipine as prescribed.  She does have some pitting edema in bilateral lower extremities which is fairly new in onset.  We discussed trying compression socks and limiting her salt intake.  She plans on further discussing the edema with her PCP.  Primary osteoarthritis of both hands: She has PIP and DIP thickening consistent with osteoarthritis of both hands.  No joint tenderness or inflammation was noted.  She is able to make a complete fist bilaterally.  She has occasional discomfort and stiffness in her hands due to underlying osteoarthritis.  We discussed the importance of joint protection and muscle strengthening.  Trochanteric bursitis, right hip - Resolved  Osteopenia of multiple sites - DEXA updated on 05/02/19: right femoral neck with BMD 0.662 with T-score -1.7.  She continues to take a calcium and  vitamin D supplement on a daily basis.  She has not had any recent falls or fractures.  She will be due to update bone density in November 2022.  Vitamin D deficiency: She is taking vitamin D 1000 units daily.  History of hypertension: She was encouraged to continue to monitor her blood pressure closely at home.  She has an upcoming appointment  with her PCP in 2 weeks.  She has been taking losartan and amlodipine as prescribed.  Her blood pressure was 135/78 today in the office which is a significant improvement since her last office visit.  She was found to have mild pitting edema bilateral lower extremities.  We discussed the use of compression stockings and limiting her salt intake.  She plans on further discussing with her PCP at her upcoming follow-up visit.  Other medical conditions are listed as follows:  History of hyperlipidemia  History of COPD  Pre-diabetes  Elevated LDL cholesterol level  Orders: Orders Placed This Encounter  Procedures  . COMPLETE METABOLIC PANEL WITH GFR  . CBC with Differential/Platelet   No orders of the defined types were placed in this encounter.     Follow-Up Instructions: Return in about 3 months (around 10/20/2020) for Rheumatoid arthritis, Osteoarthritis.   Ofilia Neas, PA-C  Note - This record has been created using Dragon software.  Chart creation errors have been sought, but may not always  have been located. Such creation errors do not reflect on  the standard of medical care.

## 2020-07-23 ENCOUNTER — Other Ambulatory Visit: Payer: Self-pay

## 2020-07-23 ENCOUNTER — Encounter: Payer: Self-pay | Admitting: Physician Assistant

## 2020-07-23 ENCOUNTER — Ambulatory Visit: Payer: Medicare HMO | Admitting: Physician Assistant

## 2020-07-23 VITALS — BP 135/78 | HR 93 | Resp 18 | Ht 66.5 in | Wt 244.4 lb

## 2020-07-23 DIAGNOSIS — M19041 Primary osteoarthritis, right hand: Secondary | ICD-10-CM | POA: Diagnosis not present

## 2020-07-23 DIAGNOSIS — E7800 Pure hypercholesterolemia, unspecified: Secondary | ICD-10-CM

## 2020-07-23 DIAGNOSIS — Z8639 Personal history of other endocrine, nutritional and metabolic disease: Secondary | ICD-10-CM

## 2020-07-23 DIAGNOSIS — M7061 Trochanteric bursitis, right hip: Secondary | ICD-10-CM

## 2020-07-23 DIAGNOSIS — R7303 Prediabetes: Secondary | ICD-10-CM | POA: Diagnosis not present

## 2020-07-23 DIAGNOSIS — Z79899 Other long term (current) drug therapy: Secondary | ICD-10-CM

## 2020-07-23 DIAGNOSIS — Z8709 Personal history of other diseases of the respiratory system: Secondary | ICD-10-CM

## 2020-07-23 DIAGNOSIS — M0579 Rheumatoid arthritis with rheumatoid factor of multiple sites without organ or systems involvement: Secondary | ICD-10-CM

## 2020-07-23 DIAGNOSIS — E559 Vitamin D deficiency, unspecified: Secondary | ICD-10-CM | POA: Diagnosis not present

## 2020-07-23 DIAGNOSIS — M8589 Other specified disorders of bone density and structure, multiple sites: Secondary | ICD-10-CM

## 2020-07-23 DIAGNOSIS — M19042 Primary osteoarthritis, left hand: Secondary | ICD-10-CM

## 2020-07-23 DIAGNOSIS — Z8679 Personal history of other diseases of the circulatory system: Secondary | ICD-10-CM | POA: Diagnosis not present

## 2020-07-23 NOTE — Patient Instructions (Signed)
Standing Labs We placed an order today for your standing lab work.   Please have your standing labs drawn in May and every 3 months   If possible, please have your labs drawn 2 weeks prior to your appointment so that the provider can discuss your results at your appointment.  We have open lab daily Monday through Thursday from 8:30-12:30 PM and 1:30-4:30 PM and Friday from 8:30-12:30 PM and 1:30-4:00 PM at the office of Dr. Shaili [name], [facility].   Please be advised, all patients with office appointments requiring lab work will take precedents over walk-in lab work.  If possible, please come for your lab work on Monday and Friday afternoons, as you may experience shorter wait times. The office is located at [address], [address], [place], [place] [address] No appointment is necessary.   Labs are drawn by [facility]. Please bring your co-pay at the time of your lab draw.  You may receive a bill from [facility] for your lab work.  If you wish to have your labs drawn at another location, please call the office 24 hours in advance to send orders.  If you have any questions regarding directions or hours of operation,  please call [phone].   As a reminder, please drink plenty of water prior to coming for your lab work. Thanks!   

## 2020-07-24 LAB — COMPLETE METABOLIC PANEL WITH GFR
AG Ratio: 1.5 (calc) (ref 1.0–2.5)
ALT: 24 U/L (ref 6–29)
AST: 25 U/L (ref 10–35)
Albumin: 4 g/dL (ref 3.6–5.1)
Alkaline phosphatase (APISO): 66 U/L (ref 37–153)
BUN: 18 mg/dL (ref 7–25)
CO2: 28 mmol/L (ref 20–32)
Calcium: 9.6 mg/dL (ref 8.6–10.4)
Chloride: 103 mmol/L (ref 98–110)
Creat: 0.83 mg/dL (ref 0.60–0.93)
GFR, Est African American: 78 mL/min/{1.73_m2} (ref >=60)
GFR, Est Non African American: 67 mL/min/{1.73_m2} (ref >=60)
Globulin: 2.6 g/dL (calc) (ref 1.9–3.7)
Glucose, Bld: 184 mg/dL — ABNORMAL HIGH (ref 65–99)
Potassium: 4.6 mmol/L (ref 3.5–5.3)
Sodium: 140 mmol/L (ref 135–146)
Total Bilirubin: 0.7 mg/dL (ref 0.2–1.2)
Total Protein: 6.6 g/dL (ref 6.1–8.1)

## 2020-07-24 LAB — CBC WITH DIFFERENTIAL/PLATELET
Absolute Monocytes: 644 cells/uL (ref 200–950)
Basophils Absolute: 63 cells/uL (ref 0–200)
Basophils Relative: 0.9 %
Eosinophils Absolute: 252 cells/uL (ref 15–500)
Eosinophils Relative: 3.6 %
HCT: 45.6 % — ABNORMAL HIGH (ref 35.0–45.0)
Hemoglobin: 14.9 g/dL (ref 11.7–15.5)
Lymphs Abs: 1134 cells/uL (ref 850–3900)
MCH: 28.2 pg (ref 27.0–33.0)
MCHC: 32.7 g/dL (ref 32.0–36.0)
MCV: 86.4 fL (ref 80.0–100.0)
MPV: 10.7 fL (ref 7.5–12.5)
Monocytes Relative: 9.2 %
Neutro Abs: 4907 cells/uL (ref 1500–7800)
Neutrophils Relative %: 70.1 %
Platelets: 245 10*3/uL (ref 140–400)
RBC: 5.28 10*6/uL — ABNORMAL HIGH (ref 3.80–5.10)
RDW: 13.2 % (ref 11.0–15.0)
Total Lymphocyte: 16.2 %
WBC: 7 10*3/uL (ref 3.8–10.8)

## 2020-07-24 NOTE — Progress Notes (Signed)
Glucose is elevated-184.  Rest of CMP WNL.  RBC count and hematocrit are slightly elevated. We will continue to monitor.

## 2020-08-05 ENCOUNTER — Ambulatory Visit (INDEPENDENT_AMBULATORY_CARE_PROVIDER_SITE_OTHER): Payer: Medicare HMO | Admitting: Physician Assistant

## 2020-08-05 ENCOUNTER — Other Ambulatory Visit: Payer: Self-pay

## 2020-08-05 ENCOUNTER — Encounter: Payer: Self-pay | Admitting: Physician Assistant

## 2020-08-05 VITALS — BP 134/83 | HR 88 | Temp 96.4°F | Ht 66.5 in | Wt 244.3 lb

## 2020-08-05 DIAGNOSIS — Z0000 Encounter for general adult medical examination without abnormal findings: Secondary | ICD-10-CM | POA: Diagnosis not present

## 2020-08-05 DIAGNOSIS — J449 Chronic obstructive pulmonary disease, unspecified: Secondary | ICD-10-CM

## 2020-08-05 DIAGNOSIS — I10 Essential (primary) hypertension: Secondary | ICD-10-CM | POA: Diagnosis not present

## 2020-08-05 DIAGNOSIS — Z5320 Procedure and treatment not carried out because of patient's decision for unspecified reasons: Secondary | ICD-10-CM | POA: Diagnosis not present

## 2020-08-05 DIAGNOSIS — R7303 Prediabetes: Secondary | ICD-10-CM | POA: Diagnosis not present

## 2020-08-05 DIAGNOSIS — E785 Hyperlipidemia, unspecified: Secondary | ICD-10-CM | POA: Diagnosis not present

## 2020-08-05 NOTE — Progress Notes (Signed)
Established Patient Office Visit  Subjective:  Patient ID: Tina Hansen, female    DOB: 06-03-1941  Age: 80 y.o. MRN: [id number]  CC:  Chief Complaint  Patient presents with  . Hypertension  . Hyperlipidemia    HPI Tina Hansen presents for follow up on hypertension and hyperlipidemia.  HTN: Pt denies chest pain, palpitations, dizziness, or headache. Patient reports an episode of significant swelling after drinking lots of electrolytes, which has improved once she stopped drinking electrolytes and took a pill of HCTZ. Taking medication as directed without side effects. Checks BP at home and readings fluctuate from 130-195/60-105. States since her severe episode of vertigo she has felt her blood pressure has been off.   HLD: Pt continues to decline statin therapy due to concerns of medication interactions and side effects. States needs to work on reducing red meat.  The 10-year ASCVD risk score Mikey Bussing DC Brooke Bonito., et al., 2013) is: 34.8%   Values used to calculate the score:     Age: 80 years     Sex: Female     Is Non-Hispanic African American: No     Diabetic: No     Tobacco smoker: No     Systolic Blood Pressure: 951 mmHg     Is BP treated: Yes     HDL Cholesterol: 75 mg/dL     Total Cholesterol: 236 mg/dL  COPD: Reports Trelegy has improved breathing and is able to be a little more active without getting so short of breath.  Past Medical History:  Diagnosis Date  . Asthma   . Cervical cancer ([place])   . Emphysema lung ([place])   . Hyperlipidemia   . Hypertension   . Rheumatoid arthritis(714.0)    [name]    Past Surgical History:  Procedure Laterality Date  . ABDOMINAL HYSTERECTOMY    . BREAST SURGERY    . Breat implants    . LIPOMA EXCISION Left    shoulder   . TUBAL LIGATION    . TUMOR REMOVAL Right    FOREARM    Family History  Problem Relation Age of Onset  . Asthma Mother   . Breast cancer Mother   . Cancer Mother   . Alcohol abuse Father   .  Diabetes Son   . Cancer Maternal Uncle   . Heart attack Maternal Uncle     Social History   Socioeconomic History  . Marital status: Divorced    Spouse name: Not on file  . Number of children: 1  . Years of education: Not on file  . Highest education level: Not on file  Occupational History  . Occupation: retired  Tobacco Use  . Smoking status: Former Smoker    Packs/day: 1.00    Years: 30.00    Pack years: 30.00    Types: Cigarettes    Quit date: 06/22/1985    Years since quitting: 35.1  . Smokeless tobacco: Never Used  Vaping Use  . Vaping Use: Never used  Substance and Sexual Activity  . Alcohol use: Yes    Comment: OCCASSIONAL GLASS OF WINE 1-2 MONTHLY  . Drug use: No  . Sexual activity: Never  Other Topics Concern  . Not on file  Social History Narrative  . Not on file   Social Determinants of Health   Financial Resource Strain: Not on file  Food Insecurity: Not on file  Transportation Needs: Not on file  Physical Activity: Not on file  Stress: Not on file  Social Connections: Not on file  Intimate Partner Violence: Not on file    Outpatient Medications Prior to Visit  Medication Sig Dispense Refill  . albuterol (PROVENTIL) (2.5 MG/3ML) 0.083% nebulizer solution USE ONE VIAL IN NEBULIZER EVERY 6 HOURS AS NEEDED 75 mL 2  . albuterol (VENTOLIN HFA) 108 (90 Base) MCG/ACT inhaler INHALE 1 TO 2 PUFFS BY MOUTH EVERY 6 HOURS AS NEEDED FOR WHEEZING AND FOR SHORTNESS OF BREATH 18 g 3  . amLODipine (NORVASC) 2.5 MG tablet Take 1 tablet by mouth once daily 90 tablet 0  . Ascorbic Acid (VITAMIN C PO) Take by mouth daily.    . Biotin 5000 MCG CAPS Take 10,000 mcg by mouth daily.    . calcium gluconate 500 MG tablet Take 500 mg by mouth daily.    . Cholecalciferol (VITAMIN D3) 1000 units CAPS Take 1 capsule by mouth daily.     . Chromium 1 MG CAPS Take 1 mg by mouth daily. Increase to 2 caps if needed    . Coenzyme Q10 (COQ-10) 100 MG CAPS Take 1 capsule by mouth daily.     Marland Kitchen ECHINACEA PO Take by mouth daily.    . fish oil-omega-3 fatty acids 1000 MG capsule Take 2 capsules by mouth daily.    . Fluticasone-Umeclidin-Vilant (TRELEGY ELLIPTA) 100-62.5-25 MCG/INH AEPB Inhale 1 puff into the lungs daily. 60 each 3  . leflunomide (ARAVA) 20 MG tablet Take 10 mg alternating with 20 mg every other day. 68 tablet 0  . losartan (COZAAR) 100 MG tablet Take 1 tablet by mouth once daily 90 tablet 0  . LUTEIN PO Take by mouth daily.    . Methylsulfonylmethane (MSM PO) Take by mouth daily.    . Milk Thistle 200 MG CAPS Take 400 mg by mouth daily.    . Multiple Vitamin (MULTIVITAMIN) capsule Take 1 capsule by mouth daily.    . NON FORMULARY Take 1 capsule by mouth daily. Collagen capsule    . vitamin E 180 MG (400 UNITS) capsule Take 400 Units by mouth daily.    . meclizine (ANTIVERT) 25 MG tablet Take 1 tablet (25 mg total) by mouth daily. (Patient not taking: Reported on 08/05/2020) 30 tablet 0   No facility-administered medications prior to visit.    Allergies  Allergen Reactions  . Other     Lamisil, vinyl or polyvinyl chloride in shoes and leather gloves   . Theophylline     REACTION: fever    ROS Review of Systems A fourteen system review of systems was performed and found to be positive as per HPI.   Objective:    Physical Exam General:  Well Developed, well nourished, in no acute distress  Neuro:  Alert and oriented,  extra-ocular muscles intact  HEENT:  Normocephalic, atraumatic, neck supple Skin:  no gross rash, warm, pink. Cardiac:  RRR, S1 S2 w/o murmur  Respiratory:  ECTA B/L with mild expiratory wheezing, no crackles or rales, Not using accessory muscles, speaking in full sentences- unlabored. Vascular:  Ext warm, no cyanosis apprec.; trace edema Psych:  No HI/SI, judgement and insight good, Euthymic mood. Full Affect.   BP 134/83   Pulse 88   Temp (!) 96.4 F (35.8 C)   Ht 5' 6.5" (1.689 m)   Wt 244 lb 4.8 oz (110.8 kg)   SpO2 96%   BMI  38.84 kg/m  Wt Readings from Last 3 Encounters:  08/05/20 244 lb 4.8 oz (110.8 kg)  07/23/20 244 lb 6.4 oz (  110.9 kg)  06/07/20 249 lb (112.9 kg)     Health Maintenance Due  Topic Date Due  . Hepatitis C Screening  Never done  . COVID-19 Vaccine (1) Never done  . TETANUS/TDAP  Never done    There are no preventive care reminders to display for this patient.  Lab Results  Component Value Date   TSH 4.560 (H) 03/20/2019   Lab Results  Component Value Date   WBC 7.0 07/23/2020   HGB 14.9 07/23/2020   HCT 45.6 (H) 07/23/2020   MCV 86.4 07/23/2020   PLT 245 07/23/2020   Lab Results  Component Value Date   NA 140 07/23/2020   K 4.6 07/23/2020   CO2 28 07/23/2020   GLUCOSE 184 (H) 07/23/2020   BUN 18 07/23/2020   CREATININE 0.83 07/23/2020   BILITOT 0.7 07/23/2020   ALKPHOS 69 03/20/2019   AST 25 07/23/2020   ALT 24 07/23/2020   PROT 6.6 07/23/2020   ALBUMIN 3.9 03/20/2019   CALCIUM 9.6 07/23/2020   Lab Results  Component Value Date   CHOL 236 (H) 12/26/2019   Lab Results  Component Value Date   HDL 75 12/26/2019   Lab Results  Component Value Date   LDLCALC 137 (H) 12/26/2019   Lab Results  Component Value Date   TRIG 122 12/26/2019   Lab Results  Component Value Date   CHOLHDL 3.1 12/26/2019   Lab Results  Component Value Date   HGBA1C 6.1 (A) 04/29/2020      Assessment & Plan:   Problem List Items Addressed This Visit      Cardiovascular and Mediastinum   Essential hypertension - Primary   Relevant Orders   Comprehensive metabolic panel   CBC   TSH   Hemoglobin A1c   Lipid panel     Respiratory   COPD mixed type (HCC)     Other   Hyperlipidemia   Relevant Orders   Comprehensive metabolic panel   CBC   TSH   Hemoglobin A1c   Lipid panel   Healthcare maintenance   Relevant Orders   Comprehensive metabolic panel   CBC   TSH   Hemoglobin A1c   Lipid panel   Pre-diabetes   Relevant Orders   Comprehensive metabolic panel    CBC   TSH   Hemoglobin A1c   Lipid panel   Statin declined     Essential Hypertension: - BP today has significantly improved from prior. Patient brings in BP device and there is a diastolic difference of 20 mmHg when compared to ours. Recommend calibration of BP device. Advised patient to check with medical supply store or contact [facility] current medication regimen. - Patient's renal function has gradually improved since HCTZ was discontinued. Recent CMP: Cr 0.83, GFR 67, electrolytes wnl. - Recommend to read labels and monitor sodium intake.  - Encourage to stay as active as possible.  COPD mixed type: -Followed by Pulmonology. -On Albuterol and Trelegy Ellipta. -SpO2 96% RA.  Hyperlipidemia: -Last lipid panel: total cholesterol 236, triglycerides 122, HDL 75, LDL 137 -Patient declines statin therapy including once weekly dose. -Recommend to reduce saturated and trans fats to improve cholesterol and reduce 10-year ASCVD risk. Recommend weight loss. -Stay as active as possible. -Patient prefers to get fasting labs with Rheumatology phlebotomist, future lab orders have been placed and advised to notify phlebotomist of additional lab.  Prediabetes: -Recent CMP, glucose elevated 184 (per patient non-fasting) and last A1c 6.1, stable. -Recommend to  follow a low carbohydrate and glucose diet. -Will repeat A1c with fasting blood work.   No orders of the defined types were placed in this encounter.   Follow-up: Return in about 3 months (around 11/02/2020) for [place] and [place].   Note:  This note was prepared with assistance of Dragon voice recognition software. Occasional wrong-word or sound-a-like substitutions may have occurred due to the inherent limitations of voice recognition software.   [name], PA-C

## 2020-08-05 NOTE — Patient Instructions (Signed)

## 2020-08-07 ENCOUNTER — Other Ambulatory Visit: Payer: Self-pay | Admitting: Pulmonary Disease

## 2020-10-08 NOTE — Progress Notes (Signed)
Office Visit Note  Patient: Tina Hansen             Date of Birth: [birth year]           MRN: [id number]             PCP: [name], PA-C Referring: [name], PA-C Visit Date: 10/22/2020 Occupation: @GUAROCC @  Subjective:  Medication management.   History of Present Illness: Tina Hansen is a 80 y.o. female with history of seropositive rheumatoid arthritis.  She states her rheumatoid arthritis is very well controlled on leflunomide.  She started leflunomide in August 2021.  Her blood pressure has been elevated recently.  She states amlodipine was stopped due to pedal edema and cough.  Her blood pressure has been fluctuating.  She also has noticed mild diarrhea on leflunomide.  Which comes and goes.  Activities of Daily Living:  Patient reports morning stiffness for 0 minutes.   Patient Denies nocturnal pain.  Difficulty dressing/grooming: Denies Difficulty climbing stairs: Denies Difficulty getting out of chair: Denies Difficulty using hands for taps, buttons, cutlery, and/or writing: Denies  Review of Systems  Constitutional: Negative for fatigue.  HENT: Positive for mouth dryness. Negative for mouth sores and nose dryness.   Eyes: Negative for pain, itching and dryness.  Respiratory: Positive for shortness of breath. Negative for difficulty breathing.   Cardiovascular: Negative for chest pain and palpitations.  Gastrointestinal: Negative for blood in stool, constipation and diarrhea.  Endocrine: Negative for increased urination.  Genitourinary: Negative for difficulty urinating.  Musculoskeletal: Positive for arthralgias and joint pain. Negative for joint swelling, myalgias, morning stiffness, muscle tenderness and myalgias.  Skin: Negative for color change, rash and redness.  Allergic/Immunologic: Negative for susceptible to infections.  Neurological: Positive for dizziness. Negative for numbness, headaches, memory loss and weakness.  Hematological:  Negative for bruising/bleeding tendency.  Psychiatric/Behavioral: Negative for confusion.    PMFS History:  Patient Active Problem List   Diagnosis Date Noted  . Pain 09/25/2019  . Fatigue 09/25/2019  . Statin declined 08/21/2019  . Non compliance with medical treatment 08/21/2019  . Cardiovascular disease 03/24/2019  . Elevated LDL cholesterol level 11/07/2018  . Pre-diabetes 11/07/2018  . BMI 40.0-44.9, adult ([place]) 08/08/2018  . Onychomycosis 04/06/2018  . Decreased GFR 01/05/2018  . Healthcare maintenance 09/08/2017  . Lipoma of left upper extremity 08/09/2017  . COPD mixed type ([place]) 06/28/2017  . History of COPD 06/26/2016  . History of hypertension 06/26/2016  . Osteopenia of multiple sites 06/26/2016  . Morbid obesity ([place]) 05/04/2016  . High risk medication use 04/25/2016  . Vocal cord dysfunction 04/29/2015  . Seropositive rheumatoid arthritis of multiple sites ([place]) 04/29/2015  . Hyperlipidemia 10/06/2007  . Essential hypertension 10/06/2007  . OTHER DISEASES OF VOCAL CORDS 10/06/2007  . Dyspnea on exertion 10/06/2007  . CERVICAL CANCER, HX OF 10/06/2007    Past Medical History:  Diagnosis Date  . Asthma   . Cervical cancer ([place])   . Emphysema lung ([place])   . Hyperlipidemia   . Hypertension   . Rheumatoid arthritis(714.0)    Ibrohim Simmers    Family History  Problem Relation Age of Onset  . Asthma Mother   . Breast cancer Mother   . Cancer Mother   . Alcohol abuse Father   . Diabetes Son   . Cancer Maternal Uncle   . Heart attack Maternal Uncle    Past Surgical History:  Procedure Laterality Date  . ABDOMINAL HYSTERECTOMY    . BREAST SURGERY    .  Breat implants    . LIPOMA EXCISION Left    shoulder   . TUBAL LIGATION    . TUMOR REMOVAL Right    FOREARM   Social History   Social History Narrative  . Not on file   Immunization History  Administered Date(s) Administered  . Pneumococcal Conjugate-13 04/06/2018  . Pneumococcal Polysaccharide-23  06/27/2007, 03/22/2009  . Zoster 05/11/2019  . Zoster Recombinat (Shingrix) 05/11/2019     Objective: Vital Signs: BP (!) 171/88 (BP Location: Left Wrist, Patient Position: Sitting, Cuff Size: Normal)   Pulse 91   Resp 18   Ht 5\' 7"  (1.702 m)   Wt 247 lb (112 kg)   BMI 38.69 kg/m    Physical Exam Vitals and nursing note reviewed.  Constitutional:      Appearance: She is well-developed.  HENT:     Head: Normocephalic and atraumatic.  Eyes:     Conjunctiva/sclera: Conjunctivae normal.  Cardiovascular:     Rate and Rhythm: Normal rate and regular rhythm.     Heart sounds: Normal heart sounds.  Pulmonary:     Effort: Pulmonary effort is normal.     Breath sounds: Normal breath sounds.  Abdominal:     General: Bowel sounds are normal.     Palpations: Abdomen is soft.  Musculoskeletal:     Cervical back: Normal range of motion.     Right lower leg: Edema present.     Left lower leg: Edema present.  Lymphadenopathy:     Cervical: No cervical adenopathy.  Skin:    General: Skin is warm and dry.     Capillary Refill: Capillary refill takes less than 2 seconds.  Neurological:     Mental Status: She is alert and oriented to person, place, and time.  Psychiatric:        Behavior: Behavior normal.      Musculoskeletal Exam: C-spine was in good range of motion without discomfort.  Shoulder joints, elbow joints, wrist joints, MCPs, PIPs and DIPs with good range of motion with no synovitis.  Hip joints, knee joints, ankles were in good range of motion.  She had no tenderness over ankles or MTPs.  CDAI Exam: CDAI Score: 0.4  Patient Global: 2 mm; Provider Global: 2 mm Swollen: 0 ; Tender: 0  Joint Exam 10/22/2020   No joint exam has been documented for this visit   There is currently no information documented on the homunculus. Go to the Rheumatology activity and complete the homunculus joint exam.  Investigation: No additional findings.  Imaging: No results  found.  Recent Labs: Lab Results  Component Value Date   WBC 7.0 07/23/2020   HGB 14.9 07/23/2020   PLT 245 07/23/2020   NA 140 07/23/2020   K 4.6 07/23/2020   CL 103 07/23/2020   CO2 28 07/23/2020   GLUCOSE 184 (H) 07/23/2020   BUN 18 07/23/2020   CREATININE 0.83 07/23/2020   BILITOT 0.7 07/23/2020   ALKPHOS 69 03/20/2019   AST 25 07/23/2020   ALT 24 07/23/2020   PROT 6.6 07/23/2020   ALBUMIN 3.9 03/20/2019   CALCIUM 9.6 07/23/2020   GFRAA 78 07/23/2020    Speciality Comments: Methotrexate discontinued due to elevated creatinine.  Arava start date February 06, 2020  Procedures:  No procedures performed Allergies: Other and Theophylline   Assessment / Plan:     Visit Diagnoses: Seropositive rheumatoid arthritis of multiple sites ([place]) - +RF, +CCP, h/o elevated sed rate: She is clinically doing very well on  leflunomide.  Her rheumatoid arthritis is well controlled.  She had no synovitis on my examination.  She has occasional loose stool.  We had detailed discussion regarding changing medications because of loose stools and elevated blood pressure.  She would prefer to stay on the current medication as she is more comfortable with the side effects compared to Biologics.  High risk medication use - Arava 10 mg alternating with 20 mg every other day.  - Plan: CBC with Differential/Platelet, COMPLETE METABOLIC PANEL WITH GFR today and then every 3 months.  I advised her to discontinue leflunomide in case she develops an infection and restart the medications once the infection resolves.  She does not want to get COVID-19 vaccination.  She states that she had pneumococcal and Shingrix vaccine.  Primary osteoarthritis of both hands-she does not have much discomfort currently.  Trochanteric bursitis, right hip -she has off-and-on discomfort.  She had mild tenderness on palpation.  Osteopenia of multiple sites - DEXA updated on 05/02/19: right femoral neck with BMD 0.662 with T-score  -1.7.  Use of calcium rich diet and vitamin D was discussed.  Regular exercise was also emphasized.  Vitamin D deficiency-she is on vitamin D supplement.  History of hypertension-her systolic blood pressure is elevated.  She will schedule follow-up appointment with her PCP.  History of COPD  History of hyperlipidemia - Plan: Lipid panel.  Increased risk of heart disease with rheumatoid arthritis was discussed.  Dietary modifications and exercise were emphasized and handout was placed in the AVS.  Pre-diabetes - Plan: Hemoglobin A1c  Vocal cord dysfunction  Orders: Orders Placed This Encounter  Procedures  . CBC with Differential/Platelet  . COMPLETE METABOLIC PANEL WITH GFR  . Hemoglobin A1c  . Lipid panel   No orders of the defined types were placed in this encounter.    Follow-Up Instructions: Return in about 5 months (around 03/24/2021) for Rheumatoid arthritis, Osteoarthritis.   Bo Merino, MD  Note - This record has been created using Editor, commissioning.  Chart creation errors have been sought, but may not always  have been located. Such creation errors do not reflect on  the standard of medical care.

## 2020-10-22 ENCOUNTER — Other Ambulatory Visit: Payer: Self-pay

## 2020-10-22 ENCOUNTER — Encounter: Payer: Self-pay | Admitting: Rheumatology

## 2020-10-22 ENCOUNTER — Ambulatory Visit: Payer: Medicare HMO | Admitting: Rheumatology

## 2020-10-22 VITALS — BP 171/88 | HR 91 | Resp 18 | Ht 67.0 in | Wt 247.0 lb

## 2020-10-22 DIAGNOSIS — M8589 Other specified disorders of bone density and structure, multiple sites: Secondary | ICD-10-CM

## 2020-10-22 DIAGNOSIS — J383 Other diseases of vocal cords: Secondary | ICD-10-CM

## 2020-10-22 DIAGNOSIS — M7061 Trochanteric bursitis, right hip: Secondary | ICD-10-CM | POA: Diagnosis not present

## 2020-10-22 DIAGNOSIS — E559 Vitamin D deficiency, unspecified: Secondary | ICD-10-CM | POA: Diagnosis not present

## 2020-10-22 DIAGNOSIS — R7303 Prediabetes: Secondary | ICD-10-CM

## 2020-10-22 DIAGNOSIS — Z79899 Other long term (current) drug therapy: Secondary | ICD-10-CM | POA: Diagnosis not present

## 2020-10-22 DIAGNOSIS — M0579 Rheumatoid arthritis with rheumatoid factor of multiple sites without organ or systems involvement: Secondary | ICD-10-CM | POA: Diagnosis not present

## 2020-10-22 DIAGNOSIS — Z8709 Personal history of other diseases of the respiratory system: Secondary | ICD-10-CM | POA: Diagnosis not present

## 2020-10-22 DIAGNOSIS — Z8639 Personal history of other endocrine, nutritional and metabolic disease: Secondary | ICD-10-CM | POA: Diagnosis not present

## 2020-10-22 DIAGNOSIS — M19042 Primary osteoarthritis, left hand: Secondary | ICD-10-CM

## 2020-10-22 DIAGNOSIS — Z8679 Personal history of other diseases of the circulatory system: Secondary | ICD-10-CM | POA: Diagnosis not present

## 2020-10-22 DIAGNOSIS — M19041 Primary osteoarthritis, right hand: Secondary | ICD-10-CM

## 2020-10-22 DIAGNOSIS — E7800 Pure hypercholesterolemia, unspecified: Secondary | ICD-10-CM

## 2020-10-22 NOTE — Patient Instructions (Signed)
Standing Labs We placed an order today for your standing lab work.   Please have your standing labs drawn in August and every 3 months  If possible, please have your labs drawn 2 weeks prior to your appointment so that the provider can discuss your results at your appointment.  We have open lab daily Monday through Thursday from 1:30-4:30 PM and Friday from 1:30-4:00 PM at the office of Dr. Bo Merino, [facility].   Please be advised, all patients with office appointments requiring lab work will take precedents over walk-in lab work.  If possible, please come for your lab work on Monday and Friday afternoons, as you may experience shorter wait times. The office is located at [address], [place], [place], [place] [address] No appointment is necessary.   Labs are drawn by [facility]. Please bring your co-pay at the time of your lab draw.  You may receive a bill from [facility] for your lab work.  If you wish to have your labs drawn at another location, please call the office 24 hours in advance to send orders.  If you have any questions regarding directions or hours of operation,  please call [phone].   As a reminder, please drink plenty of water prior to coming for your lab work. Thanks!   Heart Disease Prevention   Your inflammatory disease increases your risk of heart disease which includes heart attack, stroke, atrial fibrillation (irregular heartbeats), high blood pressure, heart failure and atherosclerosis (plaque in the arteries).  It is important to reduce your risk by:   . Keep blood pressure, cholesterol, and blood sugar at healthy levels   . Smoking Cessation   . Maintain a healthy weight  o BMI 20-25   . Eat a healthy diet  o Plenty of fresh fruit, vegetables, and whole grains  o Limit saturated fats, foods high in sodium, and added sugars  o DASH and Mediterranean diet   . Increase physical activity  o Recommend moderate physically activity for  150 minutes per week/ 30 minutes a day for five days a week These can be broken up into three separate ten-minute sessions during the day.   . Reduce Stress  . Meditation, slow breathing exercises, yoga, coloring books  . Dental visits twice a year

## 2020-10-23 LAB — COMPLETE METABOLIC PANEL WITHOUT GFR
AG Ratio: 1.6 (calc) (ref 1.0–2.5)
ALT: 23 U/L (ref 6–29)
AST: 22 U/L (ref 10–35)
Albumin: 4.1 g/dL (ref 3.6–5.1)
Alkaline phosphatase (APISO): 61 U/L (ref 37–153)
BUN: 18 mg/dL (ref 7–25)
CO2: 30 mmol/L (ref 20–32)
Calcium: 9.9 mg/dL (ref 8.6–10.4)
Chloride: 103 mmol/L (ref 98–110)
Creat: 0.85 mg/dL (ref 0.60–0.93)
GFR, Est African American: 76 mL/min/1.73m2
GFR, Est Non African American: 65 mL/min/1.73m2
Globulin: 2.5 g/dL (ref 1.9–3.7)
Glucose, Bld: 177 mg/dL — ABNORMAL HIGH (ref 65–99)
Potassium: 5.4 mmol/L — ABNORMAL HIGH (ref 3.5–5.3)
Sodium: 139 mmol/L (ref 135–146)
Total Bilirubin: 0.7 mg/dL (ref 0.2–1.2)
Total Protein: 6.6 g/dL (ref 6.1–8.1)

## 2020-10-23 LAB — CBC WITH DIFFERENTIAL/PLATELET
Absolute Monocytes: 553 cells/uL (ref 200–950)
Basophils Absolute: 68 cells/uL (ref 0–200)
Basophils Relative: 1.2 %
Eosinophils Absolute: 222 cells/uL (ref 15–500)
Eosinophils Relative: 3.9 %
HCT: 44.7 % (ref 35.0–45.0)
Hemoglobin: 14.4 g/dL (ref 11.7–15.5)
Lymphs Abs: 1123 cells/uL (ref 850–3900)
MCH: 28.5 pg (ref 27.0–33.0)
MCHC: 32.2 g/dL (ref 32.0–36.0)
MCV: 88.3 fL (ref 80.0–100.0)
MPV: 10.5 fL (ref 7.5–12.5)
Monocytes Relative: 9.7 %
Neutro Abs: 3734 cells/uL (ref 1500–7800)
Neutrophils Relative %: 65.5 %
Platelets: 210 10*3/uL (ref 140–400)
RBC: 5.06 10*6/uL (ref 3.80–5.10)
RDW: 12.7 % (ref 11.0–15.0)
Total Lymphocyte: 19.7 %
WBC: 5.7 10*3/uL (ref 3.8–10.8)

## 2020-10-23 LAB — LIPID PANEL
Cholesterol: 259 mg/dL — ABNORMAL HIGH (ref <200)
HDL: 70 mg/dL (ref >=50)
LDL Cholesterol (Calc): 161 mg/dL (calc) — ABNORMAL HIGH
Non-HDL Cholesterol (Calc): 189 mg/dL (calc) — ABNORMAL HIGH (ref <130)
Total CHOL/HDL Ratio: 3.7 (calc) (ref <5.0)
Triglycerides: 151 mg/dL — ABNORMAL HIGH (ref <150)

## 2020-10-23 LAB — HEMOGLOBIN A1C
Hgb A1c MFr Bld: 7.1 %{Hb} — ABNORMAL HIGH
Mean Plasma Glucose: 157 mg/dL
eAG (mmol/L): 8.7 mmol/L

## 2020-10-23 NOTE — Progress Notes (Signed)
CBC is normal.  Glucose is elevated at 177, potassium is mildly elevated at 5.4, hemoglobin A1c is elevated at 7.1, LDL is high at 161.

## 2020-10-29 ENCOUNTER — Ambulatory Visit (INDEPENDENT_AMBULATORY_CARE_PROVIDER_SITE_OTHER): Payer: Medicare HMO | Admitting: Physician Assistant

## 2020-10-29 ENCOUNTER — Encounter: Payer: Self-pay | Admitting: Physician Assistant

## 2020-10-29 ENCOUNTER — Other Ambulatory Visit: Payer: Self-pay

## 2020-10-29 VITALS — BP 135/73 | HR 81 | Temp 98.8°F | Ht 67.0 in | Wt 240.9 lb

## 2020-10-29 DIAGNOSIS — E119 Type 2 diabetes mellitus without complications: Secondary | ICD-10-CM

## 2020-10-29 DIAGNOSIS — Z780 Asymptomatic menopausal state: Secondary | ICD-10-CM | POA: Diagnosis not present

## 2020-10-29 DIAGNOSIS — E2839 Other primary ovarian failure: Secondary | ICD-10-CM

## 2020-10-29 DIAGNOSIS — B351 Tinea unguium: Secondary | ICD-10-CM | POA: Diagnosis not present

## 2020-10-29 DIAGNOSIS — I10 Essential (primary) hypertension: Secondary | ICD-10-CM

## 2020-10-29 DIAGNOSIS — Z0000 Encounter for general adult medical examination without abnormal findings: Secondary | ICD-10-CM | POA: Diagnosis not present

## 2020-10-29 DIAGNOSIS — E785 Hyperlipidemia, unspecified: Secondary | ICD-10-CM | POA: Diagnosis not present

## 2020-10-29 MED ORDER — CICLOPIROX 8 % EX SOLN: Freq: Every day | CUTANEOUS | 0 refills | Status: DC

## 2020-10-29 NOTE — Patient Instructions (Signed)
Preventive Care 53 Years and Older, Female Preventive care refers to lifestyle choices and visits with your health care provider that can promote health and wellness. This includes:  A yearly physical exam. This is also called an annual wellness visit.  Regular dental and eye exams.  Immunizations.  Screening for certain conditions.  Healthy lifestyle choices, such as: ? Eating a healthy diet. ? Getting regular exercise. ? Not using drugs or products that contain nicotine and tobacco. ? Limiting alcohol use. What can I expect for my preventive care visit? Physical exam Your health care provider will check your:  Height and weight. These may be used to calculate your BMI (body mass index). BMI is a measurement that tells if you are at a healthy weight.  Heart rate and blood pressure.  Body temperature.  Skin for abnormal spots. Counseling Your health care provider may ask you questions about your:  Past medical problems.  Family's medical history.  Alcohol, tobacco, and drug use.  Emotional well-being.  Home life and relationship well-being.  Sexual activity.  Diet, exercise, and sleep habits.  History of falls.  Memory and ability to understand (cognition).  Work and work Statistician.  Pregnancy and menstrual history.  Access to firearms. What immunizations do I need? Vaccines are usually given at various ages, according to a schedule. Your health care provider will recommend vaccines for you based on your age, medical history, and lifestyle or other factors, such as travel or where you work.   What tests do I need? Blood tests  Lipid and cholesterol levels. These may be checked every 5 years, or more often depending on your overall health.  Hepatitis C test.  Hepatitis B test. Screening  Lung cancer screening. You may have this screening every year starting at age 80 if you have a 30-pack-year history of smoking and currently smoke or have quit within  the past 15 years.  Colorectal cancer screening. ? All adults should have this screening starting at age 80 and continuing until age 85. ? Your health care provider may recommend screening at age 80 if you are at increased risk. ? You will have tests every 1-10 years, depending on your results and the type of screening test.  Diabetes screening. ? This is done by checking your blood sugar (glucose) after you have not eaten for a while (fasting). ? You may have this done every 1-3 years.  Mammogram. ? This may be done every 1-2 years. ? Talk with your health care provider about how often you should have regular mammograms.  Abdominal aortic aneurysm (AAA) screening. You may need this if you are a current or former smoker.  BRCA-related cancer screening. This may be done if you have a family history of breast, ovarian, tubal, or peritoneal cancers. Other tests  STD (sexually transmitted disease) testing, if you are at risk.  Bone density scan. This is done to screen for osteoporosis. You may have this done starting at age 80. Talk with your health care provider about your test results, treatment options, and if necessary, the need for more tests. Follow these instructions at home: Eating and drinking  Eat a diet that includes fresh fruits and vegetables, whole grains, lean protein, and low-fat dairy products. Limit your intake of foods with high amounts of sugar, saturated fats, and salt.  Take vitamin and mineral supplements as recommended by your health care provider.  Do not drink alcohol if your health care provider tells you not to drink.  If you drink alcohol: ? Limit how much you have to 0-1 drink a day. ? Be aware of how much alcohol is in your drink. In the [place] one drink equals one 12 oz bottle of beer (355 mL), one 5 oz glass of wine (148 mL), or one 1 oz glass of hard liquor (44 mL).   Lifestyle  Take daily care of your teeth and gums. Brush your teeth every morning  and night with fluoride toothpaste. Floss one time each day.  Stay active. Exercise for at least 30 minutes 5 or more days each week.  Do not use any products that contain nicotine or tobacco, such as cigarettes, e-cigarettes, and chewing tobacco. If you need help quitting, ask your health care provider.  Do not use drugs.  If you are sexually active, practice safe sex. Use a condom or other form of protection in order to prevent STIs (sexually transmitted infections).  Talk with your health care provider about taking a low-dose aspirin or statin.  Find healthy ways to cope with stress, such as: ? Meditation, yoga, or listening to music. ? Journaling. ? Talking to a trusted person. ? Spending time with friends and family. Safety  Always wear your seat belt while driving or riding in a vehicle.  Do not drive: ? If you have been drinking alcohol. Do not ride with someone who has been drinking. ? When you are tired or distracted. ? While texting.  Wear a helmet and other protective equipment during sports activities.  If you have firearms in your house, make sure you follow all gun safety procedures. What's next?  Visit your health care provider once a year for an annual wellness visit.  Ask your health care provider how often you should have your eyes and teeth checked.  Stay up to date on all vaccines. This information is not intended to replace advice given to you by your health care provider. Make sure you discuss any questions you have with your health care provider. Document Revised: 05/29/2020 Document Reviewed: 06/02/2018 Elsevier Patient Education  2021 [employer].

## 2020-10-29 NOTE — Progress Notes (Signed)
Subjective:   Tina Hansen is a 80 y.o. female who presents for Medicare Annual (Subsequent) preventive examination.  Review of Systems    General:   No F/C, wt loss Pulm:   No DIB, SOB, pleuritic chest pain Card:  No CP, palpitations Abd:  No n/v/d or pain Ext:  No inc edema from baseline    Objective:    Today's Vitals   10/29/20 1457 10/29/20 1542  BP: (!) 144/77 135/73  Pulse: 86 81  Temp: 98.8 F (37.1 C)   SpO2: 94%   Weight: 240 lb 14.4 oz (109.3 kg)   Height: 5\' 7"  (1.702 m)    Body mass index is 37.73 kg/m.  Advanced Directives 09/08/2017  Does Patient Have a Medical Advance Directive? No  Would patient like information on creating a medical advance directive? No - Patient declined    Current Medications (verified) Outpatient Encounter Medications as of 10/29/2020  Medication Sig  . albuterol (PROVENTIL) (2.5 MG/3ML) 0.083% nebulizer solution USE ONE VIAL IN NEBULIZER EVERY 6 HOURS AS NEEDED  . albuterol (VENTOLIN HFA) 108 (90 Base) MCG/ACT inhaler INHALE 1 TO 2 PUFFS BY MOUTH EVERY 6 HOURS AS NEEDED FOR WHEEZING AND FOR SHORTNESS OF BREATH  . Ascorbic Acid (VITAMIN C PO) Take by mouth daily.  . Biotin 5000 MCG CAPS Take 10,000 mcg by mouth daily.  . calcium gluconate 500 MG tablet Take 500 mg by mouth daily.  . Cholecalciferol (VITAMIN D3) 1000 units CAPS Take 1 capsule by mouth daily.   . Chromium 1 MG CAPS Take 1 mg by mouth daily. Increase to 2 caps if needed  . ciclopirox (PENLAC) 8 % solution Apply topically at bedtime. Apply over nail and surrounding skin. Apply daily over previous coat. After seven (7) days, may remove with alcohol and continue cycle.  . Coenzyme Q10 (COQ-10) 100 MG CAPS Take 1 capsule by mouth daily.  Marland Kitchen ECHINACEA PO Take by mouth daily.  . fish oil-omega-3 fatty acids 1000 MG capsule Take 2 capsules by mouth daily.  Marland Kitchen leflunomide (ARAVA) 20 MG tablet Take 10 mg alternating with 20 mg every other day.  . losartan (COZAAR) 100 MG  tablet Take 1 tablet by mouth once daily  . LUTEIN PO Take by mouth daily.  . Methylsulfonylmethane (MSM PO) Take by mouth daily.  . Milk Thistle 200 MG CAPS Take 400 mg by mouth daily.  . Multiple Vitamin (MULTIVITAMIN) capsule Take 1 capsule by mouth daily.  . NON FORMULARY Take 1 capsule by mouth daily. Collagen capsule  . TRELEGY ELLIPTA 100-62.5-25 MCG/INH AEPB INHALE 1 INTO THE LUNGS ONCE DAILY  . vitamin E 180 MG (400 UNITS) capsule Take 400 Units by mouth daily.   No facility-administered encounter medications on file as of 10/29/2020.    Allergies (verified) Other and Theophylline   History: Past Medical History:  Diagnosis Date  . Asthma   . Cervical cancer ([place])   . Emphysema lung ([place])   . Hyperlipidemia   . Hypertension   . Rheumatoid arthritis(714.0)    [name]   Past Surgical History:  Procedure Laterality Date  . ABDOMINAL HYSTERECTOMY    . BREAST SURGERY    . Breat implants    . LIPOMA EXCISION Left    shoulder   . TUBAL LIGATION    . TUMOR REMOVAL Right    FOREARM   Family History  Problem Relation Age of Onset  . Asthma Mother   . Breast cancer Mother   .  Cancer Mother   . Alcohol abuse Father   . Diabetes Son   . Cancer Maternal Uncle   . Heart attack Maternal Uncle    Social History   Socioeconomic History  . Marital status: Divorced    Spouse name: Not on file  . Number of children: 1  . Years of education: Not on file  . Highest education level: Not on file  Occupational History  . Occupation: retired  Tobacco Use  . Smoking status: Former Smoker    Packs/day: 1.00    Years: 30.00    Pack years: 30.00    Types: Cigarettes    Quit date: 06/22/1985    Years since quitting: 35.3  . Smokeless tobacco: Never Used  Vaping Use  . Vaping Use: Never used  Substance and Sexual Activity  . Alcohol use: Yes    Comment: OCCASSIONAL GLASS OF WINE 1-2 MONTHLY  . Drug use: No  . Sexual activity: Never  Other Topics Concern  . Not on file   Social History Narrative  . Not on file   Social Determinants of Health   Financial Resource Strain: Not on file  Food Insecurity: Not on file  Transportation Needs: Not on file  Physical Activity: Not on file  Stress: Not on file  Social Connections: Not on file    Tobacco Counseling Counseling given: Not Answered   Diabetic? Yes         Activities of Daily Living In your present state of health, do you have any difficulty performing the following activities: 10/29/2020 08/05/2020  Hearing? N N  Vision? Y N  Difficulty concentrating or making decisions? N Y  Walking or climbing stairs? Y Y  Dressing or bathing? N N  Doing errands, shopping? N N  Some recent data might be hidden    Patient Care Team: [name], PA-C as PCP - General (Physician Assistant) Noralee Space, MD as Consulting Physician (Pulmonary Disease) Bo Merino, MD as Consulting Physician (Rheumatology) Marshell Garfinkel, MD as Consulting Physician (Pulmonary Disease) [name], PA-C as Physician Assistant (Physician Assistant)  Indicate any recent Medical Services you may have received from other than [facility] providers in the past year (date may be approximate).     Assessment:   This is a routine wellness examination for [place].  Hearing/Vision screen No exam data present  Dietary issues and exercise activities discussed:  -Recommend to reduce sugar and simple carbohydrates. Follow a heart healthy diet, reduce red meat and increase lean meats and whole grains. Stay as active as possible.  Continue low sodium diet.  Goals Addressed   None    Depression Screen PHQ 2/9 Scores 10/29/2020 08/05/2020 04/29/2020 12/26/2019 08/21/2019 05/22/2019 05/08/2019  PHQ - 2 Score 0 0 0 2 0 0 1  PHQ- 9 Score 5 3 2 8 4 4 4     Fall Risk Fall Risk  10/29/2020 08/05/2020 04/29/2020 08/21/2019 01/05/2018  Falls in the past year? 1 0 0 0 No  Number falls in past yr: 0 - - 0 -  Injury with Fall? 1 - - 0 -   Risk for fall due to : No Fall Risks - - - -  Follow up Falls evaluation completed Falls evaluation completed Falls evaluation completed Falls evaluation completed -    FALL RISK PREVENTION PERTAINING TO THE HOME:  Any stairs in or around the home? Yes  If so, are there any without handrails? Yes  Home free of loose throw rugs in walkways, pet  beds, electrical cords, etc? Yes  Adequate lighting in your home to reduce risk of falls? Yes   ASSISTIVE DEVICES UTILIZED TO PREVENT FALLS:  Life alert? No  Use of a cane, walker or w/c? No  Grab bars in the bathroom? No  Shower chair or bench in shower? No  Elevated toilet seat or a handicapped toilet? Yes   TIMED UP AND GO:  Was the test performed? Yes .  Length of time to ambulate 10 feet: 9 sec.   Gait steady and fast without use of assistive device  Cognitive Function: wnl     6CIT Screen 10/29/2020  What Year? 0 points  What month? 0 points  What time? 0 points  Count back from 20 0 points  Months in reverse 0 points  Repeat phrase 0 points  Total Score 0    Immunizations Immunization History  Administered Date(s) Administered  . Pneumococcal Conjugate-13 04/06/2018  . Pneumococcal Polysaccharide-23 06/27/2007, 03/22/2009  . Zoster 05/11/2019  . Zoster Recombinat (Shingrix) 05/11/2019    TDAP status: Due, Education has been provided regarding the importance of this vaccine. Advised may receive this vaccine at local pharmacy or Health Dept. Aware to provide a copy of the vaccination record if obtained from local pharmacy or Health Dept. Verbalized acceptance and understanding.  Flu Vaccine status: Up to date  Pneumococcal vaccine status: Up to date  Covid-19 vaccine status: Declined, Education has been provided regarding the importance of this vaccine but patient still declined. Advised may receive this vaccine at local pharmacy or Health Dept.or vaccine clinic. Aware to provide a copy of the vaccination record if  obtained from local pharmacy or Health Dept. Verbalized acceptance and understanding.  Qualifies for Shingles Vaccine? Yes   Zostavax completed no  Shingrix Completed?: No.    Education has been provided regarding the importance of this vaccine. Patient has been advised to call insurance company to determine out of pocket expense if they have not yet received this vaccine. Advised may also receive vaccine at local pharmacy or Health Dept. Verbalized acceptance and understanding.  Screening Tests Health Maintenance  Topic Date Due  . COVID-19 Vaccine (1) Never done  . Hepatitis C Screening  Never done  . TETANUS/TDAP  Never done  . INFLUENZA VACCINE  01/20/2021  . DEXA SCAN  Completed  . PNA vac Low Risk Adult  Completed  . HPV VACCINES  Aged Out    Health Maintenance  Health Maintenance Due  Topic Date Due  . COVID-19 Vaccine (1) Never done  . Hepatitis C Screening  Never done  . TETANUS/TDAP  Never done    Colorectal cancer screening: No longer required.   Mammogram status: No longer required due to age.  Bone Density status: Ordered today. Pt provided with contact info and advised to call to schedule appt.  Lung Cancer Screening: (Low Dose CT Chest recommended if Age 75-80 years, 30 pack-year currently smoking OR have quit w/in 15years.) does not qualify.   Lung Cancer Screening Referral:   Additional Screening:  Hepatitis C Screening: does qualify; Completed pt declined  Vision Screening: Recommended annual ophthalmology exams for early detection of glaucoma and other disorders of the eye. Is the patient up to date with their annual eye exam?  Yes  Who is the provider or what is the name of the office in which the patient attends annual eye exams?  If pt is not established with a provider, would they like to be referred to a provider to  establish care? No .   Dental Screening: Recommended annual dental exams for proper oral hygiene  Community Resource Referral /  Chronic Care Management: CRR required this visit?  No   CCM required this visit?  No      Plan:  -Discussed with patient most recent lab results, most labs are essentially within normal limits or stable from prior with the exception of lipid panel and A1c.  A1c increased from 6.1 to 7.1.  Bad cholesterol increased from 1 7 to 161.  Patient continues to decline statin therapy and prefers to continue dietary and lifestyle changes for management of diabetes and hyperlipidemia. -BP initially elevated, BP recheck improved and stable.  Patient's blood pressure started to increase when she started Arava for RA which has worked well and wants to continue with medication. Continue to follow up with Rheumatology. -Advised to monitor potassium intake. Recent potassium mildly elevated at 5.4. -Follow up in 3 months for HTN, HLD, DM and FBW (lipid panel, cmp, a1c)   I have personally reviewed and noted the following in the patient's chart:   . Medical and social history . Use of alcohol, tobacco or illicit drugs  . Current medications and supplements including opioid prescriptions.  . Functional ability and status . Nutritional status . Physical activity . Advanced directives . List of other physicians . Hospitalizations, surgeries, and ER visits in previous 12 months . Vitals . Screenings to include cognitive, depression, and falls . Referrals and appointments  In addition, I have reviewed and discussed with patient certain preventive protocols, quality metrics, and best practice recommendations. A written personalized care plan for preventive services as well as general preventive health recommendations were provided to patient.

## 2020-11-05 ENCOUNTER — Other Ambulatory Visit: Payer: Self-pay | Admitting: Physician Assistant

## 2020-11-05 NOTE — Telephone Encounter (Signed)
Next Visit: 03/24/2021  Last Visit: 10/22/2020  Last Fill: 05/13/2020  DX: Seropositive rheumatoid arthritis of multiple sites   Current Dose per office note 10/22/2020, Arava 10 mg alternating with 20 mg every other day  Labs: 10/22/2020, CBC is normal. Glucose is elevated at 177, potassium is mildly elevated at 5.4, hemoglobin A1c is elevated at 7.1, LDL is high at 161.  Okay to refill Arava?

## 2020-12-14 ENCOUNTER — Other Ambulatory Visit: Payer: Self-pay | Admitting: Physician Assistant

## 2021-02-03 ENCOUNTER — Encounter: Payer: Self-pay | Admitting: Physician Assistant

## 2021-02-03 ENCOUNTER — Other Ambulatory Visit: Payer: Self-pay

## 2021-02-03 ENCOUNTER — Ambulatory Visit (INDEPENDENT_AMBULATORY_CARE_PROVIDER_SITE_OTHER): Payer: Medicare HMO | Admitting: Physician Assistant

## 2021-02-03 VITALS — BP 151/74 | HR 94 | Temp 99.7°F | Ht 67.0 in | Wt 240.2 lb

## 2021-02-03 DIAGNOSIS — E785 Hyperlipidemia, unspecified: Secondary | ICD-10-CM | POA: Diagnosis not present

## 2021-02-03 DIAGNOSIS — E119 Type 2 diabetes mellitus without complications: Secondary | ICD-10-CM | POA: Diagnosis not present

## 2021-02-03 DIAGNOSIS — I10 Essential (primary) hypertension: Secondary | ICD-10-CM | POA: Diagnosis not present

## 2021-02-03 LAB — POCT GLYCOSYLATED HEMOGLOBIN (HGB A1C): Hemoglobin A1C: 6.5 % — AB (ref 4.0–5.6)

## 2021-02-03 LAB — POCT UA - MICROALBUMIN
Albumin/Creatinine Ratio, Urine, POC: <30
Creatinine, POC: 300 mg/dL
Microalbumin Ur, POC: 30 mg/L

## 2021-02-03 MED ORDER — VALSARTAN 160 MG PO TABS: ORAL_TABLET | ORAL | 0 refills | Status: DC

## 2021-02-03 NOTE — Assessment & Plan Note (Signed)
-  BP elevated in office and ambulatory BP readings consistently >140/90. Patient unable to tolerate  amlodipine due to worsening peripheral edema and diuretic therapy due to renal decline which has improved and remained stable. Discussed with patient changing Losartan to Valsartan and is agreeable. Increased blood pressure likely secondary to [place] which patient wants to continue for RA, reports symptoms have greatly improved since starting medication. If blood pressure fails to improve then will consider trial of different CCB. -Recommend to continue with weight loss efforts and low sodium diet. -Will continue to monitor.

## 2021-02-03 NOTE — Assessment & Plan Note (Signed)
-  Last lipid panel: total cholesterol 259, triglycerides 151, HDL 70, LDL 161 -Patient declines statin therapy. Recommend to continue weight loss efforts and reduction of saturated and trans fats. -Will continue to monitor. Patient obtains labs with Rheumatologist and will place future order for lipid panel.

## 2021-02-03 NOTE — Patient Instructions (Signed)
High Cholesterol  High cholesterol is a condition in which the blood has high levels of a white, waxy substance similar to fat (cholesterol). The liver makes all the cholesterol that the body needs. The human body needs small amounts of cholesterol to help build cells. A person gets extra orexcess cholesterol from the food that he or she eats. The blood carries cholesterol from the liver to the rest of the body. If you have high cholesterol, deposits (plaques) may build up on the walls of your arteries. Arteries are the blood vessels that carry blood away from your heart. These plaques make the arteries narrowand stiff. Cholesterol plaques increase your risk for heart attack and stroke. Work withyour health care provider to keep your cholesterol levels in a healthy range. What increases the risk? The following factors may make you more likely to develop this condition: Eating foods that are high in animal fat (saturated fat) or cholesterol. Being overweight. Not getting enough exercise. A family history of high cholesterol (familial hypercholesterolemia). Use of tobacco products. Having diabetes. What are the signs or symptoms? There are no symptoms of this condition. How is this diagnosed? This condition may be diagnosed based on the results of a blood test. If you are older than 80 years of age, your health care provider may check your cholesterol levels every 4-6 years. You may be checked more often if you have high cholesterol or other risk factors for heart disease. The blood test for cholesterol measures: "Bad" cholesterol, or LDL cholesterol. This is the main type of cholesterol that causes heart disease. The desired level is less than 100 mg/dL. "Good" cholesterol, or HDL cholesterol. HDL helps protect against heart disease by cleaning the arteries and carrying the LDL to the liver for processing. The desired level for HDL is 60 mg/dL or higher. Triglycerides. These are fats that your  body can store or burn for energy. The desired level is less than 150 mg/dL. Total cholesterol. This measures the total amount of cholesterol in your blood and includes LDL, HDL, and triglycerides. The desired level is less than 200 mg/dL. How is this treated? This condition may be treated with: Diet changes. You may be asked to eat foods that have more fiber and less saturated fats or added sugar. Lifestyle changes. These may include regular exercise, maintaining a healthy weight, and quitting use of tobacco products. Medicines. These are given when diet and lifestyle changes have not worked. You may be prescribed a statin medicine to help lower your cholesterol levels. Follow these instructions at home: Eating and drinking  Eat a healthy, balanced diet. This diet includes: Daily servings of a variety of fresh, frozen, or canned fruits and vegetables. Daily servings of whole grain foods that are rich in fiber. Foods that are low in saturated fats and trans fats. These include poultry and fish without skin, lean cuts of meat, and low-fat dairy products. A variety of fish, especially oily fish that contain omega-3 fatty acids. Aim to eat fish at least 2 times a week. Avoid foods and drinks that have added sugar. Use healthy cooking methods, such as roasting, grilling, broiling, baking, poaching, steaming, and stir-frying. Do not fry your food except for stir-frying.  Lifestyle  Get regular exercise. Aim to exercise for a total of 150 minutes a week. Increase your activity level by doing activities such as gardening, walking, and taking the stairs. Do not use any products that contain nicotine or tobacco, such as cigarettes, e-cigarettes, and chewing tobacco.   If you need help quitting, ask your health care provider.  General instructions Take over-the-counter and prescription medicines only as told by your health care provider. Keep all follow-up visits as told by your health care provider.  This is important. Where to find more information American Heart Association: www.heart.org National Heart, Lung, and Blood Institute: www.nhlbi.nih.gov Contact a health care provider if: You have trouble achieving or maintaining a healthy diet or weight. You are starting an exercise program. You are unable to stop smoking. Get help right away if: You have chest pain. You have trouble breathing. You have any symptoms of a stroke. "BE FAST" is an easy way to remember the main warning signs of a stroke: B - Balance. Signs are dizziness, sudden trouble walking, or loss of balance. E - Eyes. Signs are trouble seeing or a sudden change in vision. F - Face. Signs are sudden weakness or numbness of the face, or the face or eyelid drooping on one side. A - Arms. Signs are weakness or numbness in an arm. This happens suddenly and usually on one side of the body. S - Speech. Signs are sudden trouble speaking, slurred speech, or trouble understanding what people say. T - Time. Time to call emergency services. Write down what time symptoms started. You have other signs of a stroke, such as: A sudden, severe headache with no known cause. Nausea or vomiting. Seizure. These symptoms may represent a serious problem that is an emergency. Do not wait to see if the symptoms will go away. Get medical help right away. Call your local emergency services (911 in the [place] Do not drive yourself to the hospital. Summary Cholesterol plaques increase your risk for heart attack and stroke. Work with your health care provider to keep your cholesterol levels in a healthy range. Eat a healthy, balanced diet, get regular exercise, and maintain a healthy weight. Do not use any products that contain nicotine or tobacco, such as cigarettes, e-cigarettes, and chewing tobacco. Get help right away if you have any symptoms of a stroke. This information is not intended to replace advice given to you by your health care  provider. Make sure you discuss any questions you have with your healthcare provider. Document Revised: 05/08/2019 Document Reviewed: 05/08/2019 Elsevier Patient Education  2022 [employer]  

## 2021-02-03 NOTE — Progress Notes (Signed)
Established Patient Office Visit  Subjective:  Patient ID: Tina Hansen, female    DOB: 1940-10-18  Age: 80 y.o. MRN: [id number]  CC:  Chief Complaint  Patient presents with   Diabetes   Hypertension   Hyperlipidemia    HPI LOVE TREST presents for follow up on hypertension, hyperlipidemia and diabetes mellitus.  HTN: Pt denies chest pain, palpitations, dizziness or increased lower extremity swelling. Taking medication as directed without side effects. Checks BP at home and readings fluctuate from 140s-170s/80-90s. Pt follows a low salt diet. Patient has not taken her medication yet for today.  HLD: Pt reports has made some dietary changes by increasing her vegetables and salads. Patient states is not interested on statin therapy.  Diabetes mellitus: Denies increased thirst or urination from baseline. Managing with diet. Reports has reduced her sugar intake especially candy and chocolate. Is limiting bread. Stays active with doing work around the house as tolerated. States her breathing is better since starting Trelegy.   Past Medical History:  Diagnosis Date   Asthma    Cervical cancer ([place])    Emphysema lung ([place])    Hyperlipidemia    Hypertension    Rheumatoid arthritis(714.0)    [name]    Past Surgical History:  Procedure Laterality Date   ABDOMINAL HYSTERECTOMY     BREAST SURGERY     Breat implants     LIPOMA EXCISION Left    shoulder    TUBAL LIGATION     TUMOR REMOVAL Right    FOREARM    Family History  Problem Relation Age of Onset   Asthma Mother    Breast cancer Mother    Cancer Mother    Alcohol abuse Father    Diabetes Son    Cancer Maternal Uncle    Heart attack Maternal Uncle     Social History   Socioeconomic History   Marital status: Divorced    Spouse name: Not on file   Number of children: 1   Years of education: Not on file   Highest education level: Not on file  Occupational History   Occupation: retired  Tobacco  Use   Smoking status: Former    Packs/day: 1.00    Years: 30.00    Pack years: 30.00    Types: Cigarettes    Quit date: 06/22/1985    Years since quitting: 35.6   Smokeless tobacco: Never  Vaping Use   Vaping Use: Never used  Substance and Sexual Activity   Alcohol use: Yes    Comment: OCCASSIONAL GLASS OF WINE 1-2 MONTHLY   Drug use: No   Sexual activity: Never  Other Topics Concern   Not on file  Social History Narrative   Not on file   Social Determinants of Health   Financial Resource Strain: Not on file  Food Insecurity: Not on file  Transportation Needs: Not on file  Physical Activity: Not on file  Stress: Not on file  Social Connections: Not on file  Intimate Partner Violence: Not on file    Outpatient Medications Prior to Visit  Medication Sig Dispense Refill   albuterol (PROVENTIL) (2.5 MG/3ML) 0.083% nebulizer solution USE ONE VIAL IN NEBULIZER EVERY 6 HOURS AS NEEDED 75 mL 2   albuterol (VENTOLIN HFA) 108 (90 Base) MCG/ACT inhaler INHALE 1 TO 2 PUFFS BY MOUTH EVERY 6 HOURS AS NEEDED FOR WHEEZING AND FOR SHORTNESS OF BREATH 18 g 3   Ascorbic Acid (VITAMIN C PO) Take by mouth daily.  Biotin 5000 MCG CAPS Take 10,000 mcg by mouth daily.     calcium gluconate 500 MG tablet Take 500 mg by mouth daily.     Cholecalciferol (VITAMIN D3) 1000 units CAPS Take 1 capsule by mouth daily.      Chromium 1 MG CAPS Take 1 mg by mouth daily. Increase to 2 caps if needed     ciclopirox (PENLAC) 8 % solution Apply topically at bedtime. Apply over nail and surrounding skin. Apply daily over previous coat. After seven (7) days, may remove with alcohol and continue cycle. 6.6 mL 0   Coenzyme Q10 (COQ-10) 100 MG CAPS Take 1 capsule by mouth daily.     ECHINACEA PO Take by mouth daily.     fish oil-omega-3 fatty acids 1000 MG capsule Take 2 capsules by mouth daily.     leflunomide (ARAVA) 20 MG tablet TAKE '10MG'$  TABLET BY MOUTH ALTERNATING WITH '20MG'$  EVERY OTHER DAY 68 tablet 0    LUTEIN PO Take by mouth daily.     Methylsulfonylmethane (MSM PO) Take by mouth daily.     Milk Thistle 200 MG CAPS Take 400 mg by mouth daily.     Multiple Vitamin (MULTIVITAMIN) capsule Take 1 capsule by mouth daily.     NON FORMULARY Take 1 capsule by mouth daily. Collagen capsule     TRELEGY ELLIPTA 100-62.5-25 MCG/INH AEPB INHALE 1 INTO THE LUNGS ONCE DAILY 60 each 5   vitamin E 180 MG (400 UNITS) capsule Take 400 Units by mouth daily.     losartan (COZAAR) 100 MG tablet Take 1 tablet by mouth once daily 90 tablet 0   No facility-administered medications prior to visit.    Allergies  Allergen Reactions   Other     Lamisil, vinyl or polyvinyl chloride in shoes and leather gloves    Theophylline     REACTION: fever    ROS Review of Systems Review of Systems:  A fourteen system review of systems was performed and found to be positive as per HPI.  Objective:    Physical Exam General:  Well Developed, well nourished, appropriate for stated age.  Neuro:  Alert and oriented,  extra-ocular muscles intact  HEENT:  Normocephalic, atraumatic, neck supple Skin:  no gross rash, warm, pink. Cardiac:  RRR Respiratory:  CTA B/L w/ slight wheezing, Not using accessory muscles, speaking in full sentences- unlabored. Vascular:  Ext warm, no cyanosis apprec.; cap RF less 2 sec. Trace of edema b/l Psych:  No HI/SI, judgement and insight good, Euthymic mood. Full Affect.  BP (!) 151/74   Pulse 94   Temp 99.7 F (37.6 C)   Ht '5\' 7"'$  (1.702 m)   Wt 240 lb 3.2 oz (109 kg)   SpO2 94%   BMI 37.62 kg/m  Wt Readings from Last 3 Encounters:  02/03/21 240 lb 3.2 oz (109 kg)  10/29/20 240 lb 14.4 oz (109.3 kg)  10/22/20 247 lb (112 kg)     Health Maintenance Due  Topic Date Due   COVID-19 Vaccine (1) Never done   TETANUS/TDAP  Never done   Zoster Vaccines- Shingrix (2 of 2) 07/06/2019   INFLUENZA VACCINE  01/20/2021    There are no preventive care reminders to display for this  patient.  Lab Results  Component Value Date   TSH 4.560 (H) 03/20/2019   Lab Results  Component Value Date   WBC 5.7 10/22/2020   HGB 14.4 10/22/2020   HCT 44.7 10/22/2020   MCV 88.3  10/22/2020   PLT 210 10/22/2020   Lab Results  Component Value Date   NA 139 10/22/2020   K 5.4 (H) 10/22/2020   CO2 30 10/22/2020   GLUCOSE 177 (H) 10/22/2020   BUN 18 10/22/2020   CREATININE 0.85 10/22/2020   BILITOT 0.7 10/22/2020   ALKPHOS 69 03/20/2019   AST 22 10/22/2020   ALT 23 10/22/2020   PROT 6.6 10/22/2020   ALBUMIN 3.9 03/20/2019   CALCIUM 9.9 10/22/2020   Lab Results  Component Value Date   CHOL 259 (H) 10/22/2020   Lab Results  Component Value Date   HDL 70 10/22/2020   Lab Results  Component Value Date   LDLCALC 161 (H) 10/22/2020   Lab Results  Component Value Date   TRIG 151 (H) 10/22/2020   Lab Results  Component Value Date   CHOLHDL 3.7 10/22/2020   Lab Results  Component Value Date   HGBA1C 6.5 (A) 02/03/2021      Assessment & Plan:   Problem List Items Addressed This Visit       Cardiovascular and Mediastinum   Essential hypertension    -BP elevated in office and ambulatory BP readings consistently >140/90. Patient unable to tolerate  amlodipine due to worsening peripheral edema and diuretic therapy due to renal decline which has improved and remained stable. Discussed with patient changing Losartan to Valsartan and is agreeable. Increased blood pressure likely secondary to [place] which patient wants to continue for RA, reports symptoms have greatly improved since starting medication. If blood pressure fails to improve then will consider trial of different CCB. -Recommend to continue with weight loss efforts and low sodium diet. -Will continue to monitor.      Relevant Medications   valsartan (DIOVAN) 160 MG tablet     Other   Hyperlipidemia    -Last lipid panel: total cholesterol 259, triglycerides 151, HDL 70, LDL 161 -Patient declines  statin therapy. Recommend to continue weight loss efforts and reduction of saturated and trans fats. -Will continue to monitor. Patient obtains labs with Rheumatologist and will place future order for lipid panel.      Relevant Medications   valsartan (DIOVAN) 160 MG tablet   Other Relevant Orders   Lipid panel   Other Visit Diagnoses     New onset type 2 diabetes mellitus ([place])    -  Primary   Relevant Medications   valsartan (DIOVAN) 160 MG tablet   Other Relevant Orders   POCT glycosylated hemoglobin (Hb A1C) (Completed)   POCT UA - Microalbumin (Completed)      New onset type 2 diabetes mellitus: -A1c has improved from 7.1 to 6.5, recommend to continue with low carbohydrate and glucose diet. -Patient is on ARB therapy. UA microalbumin normal. -Will continue to monitor.  Meds ordered this encounter  Medications   valsartan (DIOVAN) 160 MG tablet    Sig: Take 1/2 tablet daily x 1 week then take 1 tablet daily    Dispense:  90 tablet    Refill:  0    Follow-up: Return in about 4 months (around 06/05/2021) for HLD, DM.    [name], PA-C

## 2021-02-03 NOTE — Assessment & Plan Note (Signed)
>>  ASSESSMENT AND PLAN FOR ESSENTIAL HYPERTENSION WRITTEN ON 02/03/2021  5:51 PM BY ABONZA, [name], PA-C  -BP elevated in office and ambulatory BP readings consistently >140/90. Patient unable to tolerate  amlodipine due to worsening peripheral edema and diuretic therapy due to renal decline which has improved and remained stable. Discussed with patient changing Losartan to Valsartan and is agreeable. Increased blood pressure likely secondary to [place] which patient wants to continue for RA, reports symptoms have greatly improved since starting medication. If blood pressure fails to improve then will consider trial of different CCB. -Recommend to continue with weight loss efforts and low sodium diet. -Will continue to monitor.

## 2021-02-18 ENCOUNTER — Other Ambulatory Visit: Payer: Self-pay | Admitting: *Deleted

## 2021-02-18 DIAGNOSIS — Z8639 Personal history of other endocrine, nutritional and metabolic disease: Secondary | ICD-10-CM

## 2021-02-18 DIAGNOSIS — Z79899 Other long term (current) drug therapy: Secondary | ICD-10-CM

## 2021-02-19 LAB — COMPLETE METABOLIC PANEL WITH GFR
AG Ratio: 1.6 (calc) (ref 1.0–2.5)
ALT: 23 U/L (ref 6–29)
AST: 22 U/L (ref 10–35)
Albumin: 4 g/dL (ref 3.6–5.1)
Alkaline phosphatase (APISO): 63 U/L (ref 37–153)
BUN: 21 mg/dL (ref 7–25)
CO2: 29 mmol/L (ref 20–32)
Calcium: 9.1 mg/dL (ref 8.6–10.4)
Chloride: 105 mmol/L (ref 98–110)
Creat: 0.84 mg/dL (ref 0.60–0.95)
Globulin: 2.5 g/dL (calc) (ref 1.9–3.7)
Glucose, Bld: 150 mg/dL — ABNORMAL HIGH (ref 65–99)
Potassium: 4.7 mmol/L (ref 3.5–5.3)
Sodium: 140 mmol/L (ref 135–146)
Total Bilirubin: 0.6 mg/dL (ref 0.2–1.2)
Total Protein: 6.5 g/dL (ref 6.1–8.1)
eGFR: 70 mL/min/{1.73_m2} (ref >=60)

## 2021-02-19 LAB — LIPID PANEL
Cholesterol: 252 mg/dL — ABNORMAL HIGH (ref <200)
HDL: 65 mg/dL (ref >=50)
LDL Cholesterol (Calc): 160 mg/dL (calc) — ABNORMAL HIGH
Non-HDL Cholesterol (Calc): 187 mg/dL (calc) — ABNORMAL HIGH (ref <130)
Total CHOL/HDL Ratio: 3.9 (calc) (ref <5.0)
Triglycerides: 148 mg/dL (ref <150)

## 2021-02-19 LAB — CBC WITH DIFFERENTIAL/PLATELET
Absolute Monocytes: 594 cells/uL (ref 200–950)
Basophils Absolute: 70 cells/uL (ref 0–200)
Basophils Relative: 1.3 %
Eosinophils Absolute: 432 cells/uL (ref 15–500)
Eosinophils Relative: 8 %
HCT: 44.4 % (ref 35.0–45.0)
Hemoglobin: 14.6 g/dL (ref 11.7–15.5)
Lymphs Abs: 1555 cells/uL (ref 850–3900)
MCH: 29.2 pg (ref 27.0–33.0)
MCHC: 32.9 g/dL (ref 32.0–36.0)
MCV: 88.8 fL (ref 80.0–100.0)
MPV: 10.1 fL (ref 7.5–12.5)
Monocytes Relative: 11 %
Neutro Abs: 2749 cells/uL (ref 1500–7800)
Neutrophils Relative %: 50.9 %
Platelets: 221 10*3/uL (ref 140–400)
RBC: 5 10*6/uL (ref 3.80–5.10)
RDW: 12.9 % (ref 11.0–15.0)
Total Lymphocyte: 28.8 %
WBC: 5.4 10*3/uL (ref 3.8–10.8)

## 2021-02-19 NOTE — Progress Notes (Signed)
Glucose is elevated-150. Rest of CMP WNL.  CBC WNL.  Total cholesterol and LDL remain elevated. Please notify the patient and forward results to PCP as requested.

## 2021-03-03 ENCOUNTER — Other Ambulatory Visit: Payer: Self-pay | Admitting: Physician Assistant

## 2021-03-03 ENCOUNTER — Other Ambulatory Visit: Payer: Self-pay | Admitting: Pulmonary Disease

## 2021-03-03 NOTE — Telephone Encounter (Signed)
Next Visit: 03/24/2021  Last Visit: 10/22/2020  Last Fill: 11/05/2020  DX: Seropositive rheumatoid arthritis of multiple sites   Current Dose per office note 10/22/2020: Arava 10 mg alternating with 20 mg every other day  Labs: 02/18/2021 Glucose is elevated-150. Rest of CMP WNL.  CBC WNL.    Okay to refill Arava?

## 2021-03-10 NOTE — Progress Notes (Signed)
Office Visit Note  Patient: Tina Hansen             Date of Birth: [birth year]           MRN: [id number]             PCP: [name], PA-C Referring: [name], PA-C Visit Date: 03/24/2021 Occupation: '@GUAROCC'$ @  Subjective:  Medication monitoring   History of Present Illness: Tina Hansen is a 80 y.o. female with history of seropositive rheumatoid arthritis and osteoarthritis.  She is taking arava 10 mg alternating with 20 mg every other day.  She is tolerating Arava without any side effects.  She denies missing any doses of Arava recently.  She denies any recent rheumatid arthritis flares.  She denies any joint pain or joint swelling.  She denies any morning stiffness or nocturnal pain.  She has not had any difficulty with ADLs.  She denies any new concerns. She denies any recent infections. She has an updated bone density scheduled on 05/06/2021.  She continues to take vitamin D and calcium supplement on a daily basis.  She denies any recent fractures.    Activities of Daily Living:  Patient reports morning stiffness for 0 minutes.   Patient Denies nocturnal pain.  Difficulty dressing/grooming: Denies Difficulty climbing stairs: Reports Difficulty getting out of chair: Denies Difficulty using hands for taps, buttons, cutlery, and/or writing: Denies  Review of Systems  Constitutional:  Negative for fatigue.  HENT:  Positive for mouth dryness. Negative for mouth sores and nose dryness.   Eyes:  Negative for pain, itching and dryness.  Respiratory:  Negative for shortness of breath and difficulty breathing.   Cardiovascular:  Negative for chest pain and palpitations.  Gastrointestinal:  Negative for blood in stool, constipation and diarrhea.  Endocrine: Negative for increased urination.  Genitourinary:  Negative for difficulty urinating.  Musculoskeletal:  Negative for joint pain, joint pain, joint swelling, myalgias, morning stiffness, muscle tenderness  and myalgias.  Skin:  Negative for color change, rash and redness.  Allergic/Immunologic: Negative for susceptible to infections.  Neurological:  Positive for dizziness. Negative for numbness, headaches and weakness.  Hematological:  Positive for bruising/bleeding tendency.  Psychiatric/Behavioral:  Negative for confusion.    PMFS History:  Patient Active Problem List   Diagnosis Date Noted   Pain 09/25/2019   Fatigue 09/25/2019   Statin declined 08/21/2019   Non compliance with medical treatment 08/21/2019   Cardiovascular disease 03/24/2019   Elevated LDL cholesterol level 11/07/2018   Pre-diabetes 11/07/2018   BMI 40.0-44.9, adult ([place]) 08/08/2018   Onychomycosis 04/06/2018   Decreased GFR 01/05/2018   Healthcare maintenance 09/08/2017   Lipoma of left upper extremity 08/09/2017   COPD mixed type ([place]) 06/28/2017   History of COPD 06/26/2016   History of hypertension 06/26/2016   Osteopenia of multiple sites 06/26/2016   Morbid obesity ([place]) 05/04/2016   High risk medication use 04/25/2016   Vocal cord dysfunction 04/29/2015   Seropositive rheumatoid arthritis of multiple sites ([place]) 04/29/2015   Hyperlipidemia 10/06/2007   Essential hypertension 10/06/2007   OTHER DISEASES OF VOCAL CORDS 10/06/2007   Dyspnea on exertion 10/06/2007   CERVICAL CANCER, HX OF 10/06/2007    Past Medical History:  Diagnosis Date   Asthma    Cervical cancer ([place])    Emphysema lung ([place])    Hyperlipidemia    Hypertension    Rheumatoid arthritis(714.0)    [name]    Family History  Problem Relation Age of Onset  Asthma Mother    Breast cancer Mother    Cancer Mother    Alcohol abuse Father    Diabetes Son    Cancer Maternal Uncle    Heart attack Maternal Uncle    Past Surgical History:  Procedure Laterality Date   ABDOMINAL HYSTERECTOMY     BREAST SURGERY     Breat implants     LIPOMA EXCISION Left    shoulder    TUBAL LIGATION     TUMOR REMOVAL Right    FOREARM    Social History   Social History Narrative   Not on file   Immunization History  Administered Date(s) Administered   Pneumococcal Conjugate-13 04/06/2018   Pneumococcal Polysaccharide-23 06/27/2007, 03/22/2009   Zoster Recombinat (Shingrix) 05/11/2019   Zoster, Live 05/11/2019     Objective: Vital Signs: BP (!) 161/89 (BP Location: Left Wrist, Patient Position: Sitting, Cuff Size: Normal)   Pulse 87   Ht '5\' 7"'$  (1.702 m)   Wt 240 lb 9.6 oz (109.1 kg)   BMI 37.68 kg/m    Physical Exam Vitals and nursing note reviewed.  Constitutional:      Appearance: She is well-developed.  HENT:     Head: Normocephalic and atraumatic.  Eyes:     Conjunctiva/sclera: Conjunctivae normal.  Pulmonary:     Effort: Pulmonary effort is normal.  Abdominal:     Palpations: Abdomen is soft.  Musculoskeletal:     Cervical back: Normal range of motion.  Skin:    General: Skin is warm and dry.     Capillary Refill: Capillary refill takes less than 2 seconds.  Neurological:     Mental Status: She is alert and oriented to person, place, and time.  Psychiatric:        Behavior: Behavior normal.     Musculoskeletal Exam: C-spine is slightly limited range of motion without rotation to the right.  Good flexion extension of the C-spine.  Shoulder joints, elbow joints, wrist joints, MCPs, PIPs, DIPs have good range of motion with no synovitis.  Complete fist formation bilaterally.  PIP and DIP prominence consistent with osteoarthritis of both hands.  Hip joints have good range of motion with no discomfort.  Knee joints have good range of motion with no warmth or effusion.  Ankle joints have good range of motion with no tenderness or joint swelling.  CDAI Exam: CDAI Score: 0  Patient Global: 0 mm; Provider Global: 0 mm Swollen: 0 ; Tender: 0  Joint Exam 03/24/2021   No joint exam has been documented for this visit   There is currently no information documented on the homunculus. Go to the  Rheumatology activity and complete the homunculus joint exam.  Investigation: No additional findings.  Imaging: No results found.  Recent Labs: Lab Results  Component Value Date   WBC 5.4 02/18/2021   HGB 14.6 02/18/2021   PLT 221 02/18/2021   NA 140 02/18/2021   K 4.7 02/18/2021   CL 105 02/18/2021   CO2 29 02/18/2021   GLUCOSE 150 (H) 02/18/2021   BUN 21 02/18/2021   CREATININE 0.84 02/18/2021   BILITOT 0.6 02/18/2021   ALKPHOS 69 03/20/2019   AST 22 02/18/2021   ALT 23 02/18/2021   PROT 6.5 02/18/2021   ALBUMIN 3.9 03/20/2019   CALCIUM 9.1 02/18/2021   GFRAA 76 10/22/2020    Speciality Comments: Methotrexate discontinued due to elevated creatinine.  Arava start date February 06, 2020  Procedures:  No procedures performed Allergies: Other and Theophylline  Assessment / Plan:     Visit Diagnoses: Seropositive rheumatoid arthritis of multiple sites ([place]) - +RF, +CCP, h/o elevated sed rate: She has no tenderness or synovitis on examination today.  She has not had any recent rheumatoid arthritis flares.  She is clinically doing well taking Arava 10 mg alternating with 20 mg every other day.  She continues to tolerate Arava without any side effects.  She has had an improvement in her blood pressure since starting on valsartan.  Discussed the importance of close blood pressure monitoring. She is not experiencing any morning stiffness or nocturnal pain.  She has no difficulty with ADLs.  We discussed the importance of regular exercise.  She was encouraged to continue to take [place] as prescribed.  She was advised to notify us if she develops increased joint pain or joint swelling.  She will follow-up in the office in 5 months.  High risk medication use - Arava 10 mg alternating with 20 mg every other day.  CBC and CMP drawn on 02/18/21.  Results were reviewed with the patient today in the office.  She will be due to update lab work in November and every 3 months. Standing orders for  CBC and CMP are in place.  She has not had any recent infections.  Discussed the importance of holding [place] if she develops signs or symptoms of an infection and to resume once infection has completely cleared.  Primary osteoarthritis of both hands: PIP and DIP thickening consistent with osteoarthritis of both hands.  No tenderness or inflammation was noted on examination today.  Complete fist formation bilaterally.  Discussed the importance of joint protection and muscle strengthening.  Trochanteric bursitis, right hip: Resolved.  Osteopenia of multiple sites - DEXA updated on 05/02/19: right femoral neck with BMD 0.662 with T-score -1.7. Next DEXA scheduled 05/06/21. She has been taking a calcium and vitamin D supplement daily.  No recent fractures.- Plan: DG BONE DENSITY (DXA)  Vitamin D deficiency: She is taking a daily vitamin D supplement.  History of hypertension: Discussed the importance of close blood pressure monitoring.  Other medical conditions are listed as follows:  Vocal cord dysfunction  Pre-diabetes  History of COPD  History of hyperlipidemia  Orders: Orders Placed This Encounter  Procedures   DG BONE DENSITY (DXA)    No orders of the defined types were placed in this encounter.  Follow-Up Instructions: Return in about 5 months (around 08/22/2021) for Rheumatoid arthritis, Osteoarthritis.   Ofilia Neas, PA-C  Note - This record has been created using Dragon software.  Chart creation errors have been sought, but may not always  have been located. Such creation errors do not reflect on  the standard of medical care.

## 2021-03-24 ENCOUNTER — Ambulatory Visit: Payer: Medicare HMO | Admitting: Physician Assistant

## 2021-03-24 ENCOUNTER — Encounter: Payer: Self-pay | Admitting: Physician Assistant

## 2021-03-24 ENCOUNTER — Other Ambulatory Visit: Payer: Self-pay

## 2021-03-24 VITALS — BP 161/89 | HR 87 | Ht 67.0 in | Wt 240.6 lb

## 2021-03-24 DIAGNOSIS — Z79899 Other long term (current) drug therapy: Secondary | ICD-10-CM

## 2021-03-24 DIAGNOSIS — M19041 Primary osteoarthritis, right hand: Secondary | ICD-10-CM

## 2021-03-24 DIAGNOSIS — R7303 Prediabetes: Secondary | ICD-10-CM

## 2021-03-24 DIAGNOSIS — J383 Other diseases of vocal cords: Secondary | ICD-10-CM | POA: Diagnosis not present

## 2021-03-24 DIAGNOSIS — E559 Vitamin D deficiency, unspecified: Secondary | ICD-10-CM | POA: Diagnosis not present

## 2021-03-24 DIAGNOSIS — M8589 Other specified disorders of bone density and structure, multiple sites: Secondary | ICD-10-CM

## 2021-03-24 DIAGNOSIS — M0579 Rheumatoid arthritis with rheumatoid factor of multiple sites without organ or systems involvement: Secondary | ICD-10-CM

## 2021-03-24 DIAGNOSIS — M7061 Trochanteric bursitis, right hip: Secondary | ICD-10-CM

## 2021-03-24 DIAGNOSIS — Z8679 Personal history of other diseases of the circulatory system: Secondary | ICD-10-CM

## 2021-03-24 DIAGNOSIS — Z8709 Personal history of other diseases of the respiratory system: Secondary | ICD-10-CM | POA: Diagnosis not present

## 2021-03-24 DIAGNOSIS — M19042 Primary osteoarthritis, left hand: Secondary | ICD-10-CM

## 2021-03-24 DIAGNOSIS — Z8639 Personal history of other endocrine, nutritional and metabolic disease: Secondary | ICD-10-CM

## 2021-03-24 NOTE — Patient Instructions (Signed)
Standing Labs We placed an order today for your standing lab work.   Please have your standing labs drawn in November and every 3 months   If possible, please have your labs drawn 2 weeks prior to your appointment so that the provider can discuss your results at your appointment.  Please note that you may see your imaging and lab results in [place] before we have reviewed them. We may be awaiting multiple results to interpret others before contacting you. Please allow our office up to 72 hours to thoroughly review all of the results before contacting the office for clarification of your results.  We have open lab daily: Monday through Thursday from 1:30-4:30 PM and Friday from 1:30-4:00 PM at the office of Dr. Bo Merino, [facility].   Please be advised, all patients with office appointments requiring lab work will take precedent over walk-in lab work.  If possible, please come for your lab work on Monday and Friday afternoons, as you may experience shorter wait times. The office is located at [address] [place], [place], [place] [address] No appointment is necessary.   Labs are drawn by [facility]. Please bring your co-pay at the time of your lab draw.  You may receive a bill from [facility] for your lab work.  If you wish to have your labs drawn at another location, please call the office 24 hours in advance to send orders.  If you have any questions regarding directions or hours of operation,  please call [phone].   As a reminder, please drink plenty of water prior to coming for your lab work. Thanks!

## 2021-04-22 ENCOUNTER — Other Ambulatory Visit: Payer: Self-pay | Admitting: *Deleted

## 2021-04-22 DIAGNOSIS — Z79899 Other long term (current) drug therapy: Secondary | ICD-10-CM

## 2021-04-23 ENCOUNTER — Telehealth: Payer: Self-pay | Admitting: Physician Assistant

## 2021-04-23 LAB — COMPLETE METABOLIC PANEL WITH GFR
AG Ratio: 1.5 (calc) (ref 1.0–2.5)
ALT: 25 U/L (ref 6–29)
AST: 19 U/L (ref 10–35)
Albumin: 4.1 g/dL (ref 3.6–5.1)
Alkaline phosphatase (APISO): 66 U/L (ref 37–153)
BUN/Creatinine Ratio: 23 (calc) — ABNORMAL HIGH (ref 6–22)
BUN: 25 mg/dL (ref 7–25)
CO2: 24 mmol/L (ref 20–32)
Calcium: 10.1 mg/dL (ref 8.6–10.4)
Chloride: 99 mmol/L (ref 98–110)
Creat: 1.08 mg/dL — ABNORMAL HIGH (ref 0.60–0.95)
Globulin: 2.8 g/dL (calc) (ref 1.9–3.7)
Glucose, Bld: 398 mg/dL — ABNORMAL HIGH (ref 65–99)
Potassium: 5.4 mmol/L — ABNORMAL HIGH (ref 3.5–5.3)
Sodium: 134 mmol/L — ABNORMAL LOW (ref 135–146)
Total Bilirubin: 0.6 mg/dL (ref 0.2–1.2)
Total Protein: 6.9 g/dL (ref 6.1–8.1)
eGFR: 52 mL/min/{1.73_m2} — ABNORMAL LOW (ref >=60)

## 2021-04-23 LAB — CBC WITH DIFFERENTIAL/PLATELET
Absolute Monocytes: 728 cells/uL (ref 200–950)
Basophils Absolute: 68 cells/uL (ref 0–200)
Basophils Relative: 0.9 %
Eosinophils Absolute: 233 cells/uL (ref 15–500)
Eosinophils Relative: 3.1 %
HCT: 47.2 % — ABNORMAL HIGH (ref 35.0–45.0)
Hemoglobin: 15.6 g/dL — ABNORMAL HIGH (ref 11.7–15.5)
Lymphs Abs: 1463 cells/uL (ref 850–3900)
MCH: 29.1 pg (ref 27.0–33.0)
MCHC: 33.1 g/dL (ref 32.0–36.0)
MCV: 88.1 fL (ref 80.0–100.0)
MPV: 11.1 fL (ref 7.5–12.5)
Monocytes Relative: 9.7 %
Neutro Abs: 5010 cells/uL (ref 1500–7800)
Neutrophils Relative %: 66.8 %
Platelets: 211 10*3/uL (ref 140–400)
RBC: 5.36 10*6/uL — ABNORMAL HIGH (ref 3.80–5.10)
RDW: 12.3 % (ref 11.0–15.0)
Total Lymphocyte: 19.5 %
WBC: 7.5 10*3/uL (ref 3.8–10.8)

## 2021-04-23 NOTE — Telephone Encounter (Signed)
Pt states her fasting blood sugars are elevated. She states her BS was 394 yesterday and today it is 388. She stating she is having frequent urination, dizziness, "foggy headed". Advised patient that she should go to ED or call EMS to come out to evaluate. Pt declines and states she will be seen in our office instead.   No provider in office this afternoon. Pt scheduled for tomorrow at 120pm and advised again that I suggest she go to ED for evaluation and treatment today. Pt declined again and accepted apt for tomorrow. AS, CMA

## 2021-04-23 NOTE — Progress Notes (Signed)
RBC count, hgb, and hct are borderline elevated.  Rest of CBC WNL.  We will continue to monitor.  Creatinine is elevated-1.08 and GFR is slightly low-52.  Please clarify if she has been taking any NSAIDs. She should avoid the use of all NSAIDs. Glucose was very elevated-398.  Potassium is borderline elevated-5.4.  Please notify the patient and forward lab work to PCP as well.

## 2021-04-24 ENCOUNTER — Ambulatory Visit (INDEPENDENT_AMBULATORY_CARE_PROVIDER_SITE_OTHER): Payer: Medicare HMO | Admitting: Physician Assistant

## 2021-04-24 ENCOUNTER — Other Ambulatory Visit: Payer: Self-pay

## 2021-04-24 ENCOUNTER — Encounter: Payer: Self-pay | Admitting: Physician Assistant

## 2021-04-24 VITALS — BP 111/71 | HR 99 | Temp 97.6°F | Ht 67.0 in | Wt 233.0 lb

## 2021-04-24 DIAGNOSIS — E1165 Type 2 diabetes mellitus with hyperglycemia: Secondary | ICD-10-CM | POA: Diagnosis not present

## 2021-04-24 DIAGNOSIS — I10 Essential (primary) hypertension: Secondary | ICD-10-CM

## 2021-04-24 LAB — POCT GLYCOSYLATED HEMOGLOBIN (HGB A1C): Hemoglobin A1C: 9.6 % — AB (ref 4.0–5.6)

## 2021-04-24 MED ORDER — OZEMPIC (0.25 OR 0.5 MG/DOSE) 2 MG/1.5ML ~~LOC~~ SOPN: PEN_INJECTOR | SUBCUTANEOUS | 0 refills | Status: DC

## 2021-04-24 NOTE — Patient Instructions (Signed)
Diabetes Mellitus and Nutrition, Adult When you have diabetes, or diabetes mellitus, it is very important to have healthy eating habits because your blood sugar (glucose) levels are greatly affected by what you eat and drink. Eating healthy foods in the right amounts, at about the same times every day, can help you:  Control your blood glucose.  Lower your risk of heart disease.  Improve your blood pressure.  Reach or maintain a healthy weight. What can affect my meal plan? Every person with diabetes is different, and each person has different needs for a meal plan. Your health care provider may recommend that you work with a dietitian to make a meal plan that is best for you. Your meal plan may vary depending on factors such as:  The calories you need.  The medicines you take.  Your weight.  Your blood glucose, blood pressure, and cholesterol levels.  Your activity level.  Other health conditions you have, such as heart or kidney disease. How do carbohydrates affect me? Carbohydrates, also called carbs, affect your blood glucose level more than any other type of food. Eating carbs naturally raises the amount of glucose in your blood. Carb counting is a method for keeping track of how many carbs you eat. Counting carbs is important to keep your blood glucose at a healthy level, especially if you use insulin or take certain oral diabetes medicines. It is important to know how many carbs you can safely have in each meal. This is different for every person. Your dietitian can help you calculate how many carbs you should have at each meal and for each snack. How does alcohol affect me? Alcohol can cause a sudden decrease in blood glucose (hypoglycemia), especially if you use insulin or take certain oral diabetes medicines. Hypoglycemia can be a life-threatening condition. Symptoms of hypoglycemia, such as sleepiness, dizziness, and confusion, are similar to symptoms of having too much  alcohol.  Do not drink alcohol if: ? Your health care provider tells you not to drink. ? You are pregnant, may be pregnant, or are planning to become pregnant.  If you drink alcohol: ? Do not drink on an empty stomach. ? Limit how much you use to:  0-1 drink a day for women.  0-2 drinks a day for men. ? Be aware of how much alcohol is in your drink. In the [place] one drink equals one 12 oz bottle of beer (355 mL), one 5 oz glass of wine (148 mL), or one 1 oz glass of hard liquor (44 mL). ? Keep yourself hydrated with water, diet soda, or unsweetened iced tea.  Keep in mind that regular soda, juice, and other mixers may contain a lot of sugar and must be counted as carbs. What are tips for following this plan? Reading food labels  Start by checking the serving size on the "Nutrition Facts" label of packaged foods and drinks. The amount of calories, carbs, fats, and other nutrients listed on the label is based on one serving of the item. Many items contain more than one serving per package.  Check the total grams (g) of carbs in one serving. You can calculate the number of servings of carbs in one serving by dividing the total carbs by 15. For example, if a food has 30 g of total carbs per serving, it would be equal to 2 servings of carbs.  Check the number of grams (g) of saturated fats and trans fats in one serving. Choose foods that have   a low amount or none of these fats.  Check the number of milligrams (mg) of salt (sodium) in one serving. Most people should limit total sodium intake to less than 2,300 mg per day.  Always check the nutrition information of foods labeled as "low-fat" or "nonfat." These foods may be higher in added sugar or refined carbs and should be avoided.  Talk to your dietitian to identify your daily goals for nutrients listed on the label. Shopping  Avoid buying canned, pre-made, or processed foods. These foods tend to be high in fat, sodium, and added  sugar.  Shop around the outside edge of the grocery store. This is where you will most often find fresh fruits and vegetables, bulk grains, fresh meats, and fresh dairy. Cooking  Use low-heat cooking methods, such as baking, instead of high-heat cooking methods like deep frying.  Cook using healthy oils, such as olive, canola, or sunflower oil.  Avoid cooking with butter, cream, or high-fat meats. Meal planning  Eat meals and snacks regularly, preferably at the same times every day. Avoid going long periods of time without eating.  Eat foods that are high in fiber, such as fresh fruits, vegetables, beans, and whole grains. Talk with your dietitian about how many servings of carbs you can eat at each meal.  Eat 4-6 oz (112-168 g) of lean protein each day, such as lean meat, chicken, fish, eggs, or tofu. One ounce (oz) of lean protein is equal to: ? 1 oz (28 g) of meat, chicken, or fish. ? 1 egg. ?  cup (62 g) of tofu.  Eat some foods each day that contain healthy fats, such as avocado, nuts, seeds, and fish.   What foods should I eat? Fruits Berries. Apples. Oranges. Peaches. Apricots. Plums. Grapes. Mango. Papaya. Pomegranate. Kiwi. Cherries. Vegetables Lettuce. Spinach. Leafy greens, including kale, chard, collard greens, and mustard greens. Beets. Cauliflower. Cabbage. Broccoli. Carrots. Green beans. Tomatoes. Peppers. Onions. Cucumbers. Brussels sprouts. Grains Whole grains, such as whole-wheat or whole-grain bread, crackers, tortillas, cereal, and pasta. Unsweetened oatmeal. Quinoa. Brown or wild rice. Meats and other proteins Seafood. Poultry without skin. Lean cuts of poultry and beef. Tofu. Nuts. Seeds. Dairy Low-fat or fat-free dairy products such as milk, yogurt, and cheese. The items listed above may not be a complete list of foods and beverages you can eat. Contact a dietitian for more information. What foods should I avoid? Fruits Fruits canned with  syrup. Vegetables Canned vegetables. Frozen vegetables with butter or cream sauce. Grains Refined white flour and flour products such as bread, pasta, snack foods, and cereals. Avoid all processed foods. Meats and other proteins Fatty cuts of meat. Poultry with skin. Breaded or fried meats. Processed meat. Avoid saturated fats. Dairy Full-fat yogurt, cheese, or milk. Beverages Sweetened drinks, such as soda or iced tea. The items listed above may not be a complete list of foods and beverages you should avoid. Contact a dietitian for more information. Questions to ask a health care provider  Do I need to meet with a diabetes educator?  Do I need to meet with a dietitian?  What number can I call if I have questions?  When are the best times to check my blood glucose? Where to find more information:  American Diabetes Association: diabetes.org  Academy of Nutrition and Dietetics: www.eatright.org  National Institute of Diabetes and Digestive and Kidney Diseases: www.niddk.nih.gov  Association of Diabetes Care and Education Specialists: www.diabeteseducator.org Summary  It is important to have healthy eating   habits because your blood sugar (glucose) levels are greatly affected by what you eat and drink.  A healthy meal plan will help you control your blood glucose and maintain a healthy lifestyle.  Your health care provider may recommend that you work with a dietitian to make a meal plan that is best for you.  Keep in mind that carbohydrates (carbs) and alcohol have immediate effects on your blood glucose levels. It is important to count carbs and to use alcohol carefully. This information is not intended to replace advice given to you by your health care provider. Make sure you discuss any questions you have with your health care provider. Document Revised: 05/16/2019 Document Reviewed: 05/16/2019 Elsevier Patient Education  2021 Elsevier Inc.  

## 2021-04-24 NOTE — Progress Notes (Signed)
Established Patient Office Visit  Subjective:  Patient ID: Tina Hansen, female    DOB: July 31, 1940  Age: 79 y.o. MRN: [id number]  CC:  Chief Complaint  Patient presents with   Blood Sugar Problem    HPI Tina Hansen presents for concerns of elevated blood sugar. Patient has labs done 04/22/2021 and glucose was 398. Patient does report increased thirst from baseline and has been drinking more water. Patient has not been checking her sugar at home because her device was not working and her son recently bought her new batteries, checked her sugar at home yesterday and it was in the 300s. Denies increasing carbohydrate or sugar intake but does state does have cookies and a piece of cake few times per week. Denies recent corticosteroid therapy. States most recent BP readings at home have been 130/70-80s.  Past Medical History:  Diagnosis Date   Asthma    Cervical cancer ([place])    Emphysema lung ([place])    Hyperlipidemia    Hypertension    Rheumatoid arthritis(714.0)    [name]    Past Surgical History:  Procedure Laterality Date   ABDOMINAL HYSTERECTOMY     BREAST SURGERY     Breat implants     LIPOMA EXCISION Left    shoulder    TUBAL LIGATION     TUMOR REMOVAL Right    FOREARM    Family History  Problem Relation Age of Onset   Asthma Mother    Breast cancer Mother    Cancer Mother    Alcohol abuse Father    Diabetes Son    Cancer Maternal Uncle    Heart attack Maternal Uncle     Social History   Socioeconomic History   Marital status: Divorced    Spouse name: Not on file   Number of children: 1   Years of education: Not on file   Highest education level: Not on file  Occupational History   Occupation: retired  Tobacco Use   Smoking status: Former    Packs/day: 1.00    Years: 30.00    Pack years: 30.00    Types: Cigarettes    Quit date: 06/22/1985    Years since quitting: 35.8   Smokeless tobacco: Never  Vaping Use   Vaping Use: Never used   Substance and Sexual Activity   Alcohol use: Yes    Comment: OCCASSIONAL GLASS OF WINE 1-2 MONTHLY   Drug use: No   Sexual activity: Never  Other Topics Concern   Not on file  Social History Narrative   Not on file   Social Determinants of Health   Financial Resource Strain: Not on file  Food Insecurity: Not on file  Transportation Needs: Not on file  Physical Activity: Not on file  Stress: Not on file  Social Connections: Not on file  Intimate Partner Violence: Not on file    Outpatient Medications Prior to Visit  Medication Sig Dispense Refill   albuterol (PROVENTIL) (2.5 MG/3ML) 0.083% nebulizer solution USE ONE VIAL IN NEBULIZER EVERY 6 HOURS AS NEEDED 75 mL 2   albuterol (VENTOLIN HFA) 108 (90 Base) MCG/ACT inhaler INHALE 1 TO 2 PUFFS BY MOUTH EVERY 6 HOURS AS NEEDED FOR WHEEZING AND FOR SHORTNESS OF BREATH 18 g 3   Ascorbic Acid (VITAMIN C PO) Take by mouth daily.     Biotin 5000 MCG CAPS Take 10,000 mcg by mouth daily.     calcium gluconate 500 MG tablet Take 500 mg by mouth  daily.     Cholecalciferol (VITAMIN D3) 1000 units CAPS Take 1 capsule by mouth daily.      Chromium 1 MG CAPS Take 1 mg by mouth daily. Increase to 2 caps if needed     Coenzyme Q10 (COQ-10) 100 MG CAPS Take 1 capsule by mouth daily.     ECHINACEA PO Take by mouth daily.     fish oil-omega-3 fatty acids 1000 MG capsule Take 2 capsules by mouth daily.     Fluticasone-Umeclidin-Vilant (TRELEGY ELLIPTA) 100-62.5-25 MCG/INH AEPB INHALE 1 PUFF ONCE DAILY 60 each 1   leflunomide (ARAVA) 20 MG tablet TAKE 10MG TABLET BY MOUTH ALTERNATING WITH 20MG EVERY OTHER DAY 68 tablet 0   LUTEIN PO Take by mouth daily.     Methylsulfonylmethane (MSM PO) Take by mouth daily.     Milk Thistle 200 MG CAPS Take 400 mg by mouth daily.     Multiple Vitamin (MULTIVITAMIN) capsule Take 1 capsule by mouth daily.     NON FORMULARY Take 1 capsule by mouth daily. Collagen capsule     valsartan (DIOVAN) 160 MG tablet Take 1/2  tablet daily x 1 week then take 1 tablet daily 90 tablet 0   vitamin E 180 MG (400 UNITS) capsule Take 400 Units by mouth daily.     ciclopirox (PENLAC) 8 % solution Apply topically at bedtime. Apply over nail and surrounding skin. Apply daily over previous coat. After seven (7) days, may remove with alcohol and continue cycle. (Patient not taking: Reported on 04/24/2021) 6.6 mL 0   No facility-administered medications prior to visit.    Allergies  Allergen Reactions   Other     Lamisil, vinyl or polyvinyl chloride in shoes and leather gloves    Theophylline     REACTION: fever    ROS Review of Systems Review of Systems:  A fourteen system review of systems was performed and found to be positive as per HPI.   Objective:    Physical Exam General:  Well Developed, well nourished, in no acute distress  Neuro:  Alert and oriented,  extra-ocular muscles intact  HEENT:  Normocephalic, atraumatic, neck supple Skin:  no gross rash, warm, pink. Cardiac:  RRR Respiratory:  +wheezing, no crackles or rales, Not using accessory muscles, speaking in full sentences- unlabored. Vascular:  Ext warm, no cyanosis apprec.; cap RF less 2 sec. No pitting edema  Psych:  No HI/SI, judgement and insight good, Euthymic mood. Full Affect.  BP 111/71   Pulse 99   Temp 97.6 F (36.4 C)   Ht '5\' 7"'  (1.702 m)   Wt 233 lb (105.7 kg)   SpO2 95%   BMI 36.49 kg/m  Wt Readings from Last 3 Encounters:  04/24/21 233 lb (105.7 kg)  03/24/21 240 lb 9.6 oz (109.1 kg)  02/03/21 240 lb 3.2 oz (109 kg)     Health Maintenance Due  Topic Date Due   COVID-19 Vaccine (1) Never done   TETANUS/TDAP  Never done   Zoster Vaccines- Shingrix (2 of 2) 07/06/2019   INFLUENZA VACCINE  Never done    There are no preventive care reminders to display for this patient.  Lab Results  Component Value Date   TSH 4.560 (H) 03/20/2019   Lab Results  Component Value Date   WBC 7.5 04/22/2021   HGB 15.6 (H) 04/22/2021    HCT 47.2 (H) 04/22/2021   MCV 88.1 04/22/2021   PLT 211 04/22/2021   Lab Results  Component Value  Date   NA 134 (L) 04/22/2021   K 5.4 (H) 04/22/2021   CO2 24 04/22/2021   GLUCOSE 398 (H) 04/22/2021   BUN 25 04/22/2021   CREATININE 1.08 (H) 04/22/2021   BILITOT 0.6 04/22/2021   ALKPHOS 69 03/20/2019   AST 19 04/22/2021   ALT 25 04/22/2021   PROT 6.9 04/22/2021   ALBUMIN 3.9 03/20/2019   CALCIUM 10.1 04/22/2021   EGFR 52 (L) 04/22/2021   Lab Results  Component Value Date   CHOL 252 (H) 02/18/2021   Lab Results  Component Value Date   HDL 65 02/18/2021   Lab Results  Component Value Date   LDLCALC 160 (H) 02/18/2021   Lab Results  Component Value Date   TRIG 148 02/18/2021   Lab Results  Component Value Date   CHOLHDL 3.9 02/18/2021   Lab Results  Component Value Date   HGBA1C 9.6 (A) 04/24/2021      Assessment & Plan:   Problem List Items Addressed This Visit       Cardiovascular and Mediastinum   Essential hypertension   Other Visit Diagnoses     Uncontrolled type 2 diabetes mellitus with hyperglycemia ([place])    -  Primary   Relevant Medications   Semaglutide,0.25 or 0.5MG/DOS, (OZEMPIC, 0.25 OR 0.5 MG/DOSE,) 2 MG/1.5ML SOPN   Other Relevant Orders   POCT glycosylated hemoglobin (Hb A1C) (Completed)      Uncontrolled type 2 diabetes mellitus with hyperglycemia: -A1c today 9.6, increased from 6.5, uncontrolled. Discussed with patient starting treatment therapy and prefers alternative besides Metformin. Discussed GLP-1, no hx of MEN or MTC. Discussed potential side effects, including but not limited to, pancreatitis, nausea, diarrhea, or constipation. Advised to let me know if unable to tolerate medication. -Discussed low carbohydrate and glucose diet including reducing sweets.  -Continue ambulatory glucose monitoring. -Recent renal decline (eGFR 52, Cr 1.08) likely secondary to uncontrolled DM, patient is on ARB therapy and recent potassium mildly  elevated at 5.4. Will repeat CMP in 1 week. -Follow up as scheduled.  Hypertension: -BP in office at goal. -Recent CMP: Cr 1.08, eGFR 52, sodium 134, and potassium 5.4. Discussed with patient uncontrolled DM and hyperglycemia likely contributing to renal decline. Excessive thirst and increased water intake likely contributing to mild hyponatremia. Will repeat CMP in 1 week to re-evaluate renal function and electrolytes. If renal function and electrolytes fail to improve then will consider holding/changing Valsartan.  -Will continue to monitor.  Meds ordered this encounter  Medications   Semaglutide,0.25 or 0.5MG/DOS, (OZEMPIC, 0.25 OR 0.5 MG/DOSE,) 2 MG/1.5ML SOPN    Sig: Inject 0.25 mg into skin once a week x 4 weeks. Then inject 0.5 mg into skin once a week.    Dispense:  3 mL    Refill:  0    Order Specific Question:   Supervising Provider    Answer:   Beatrice Lecher D [[id number]]    Follow-up: Return for lab visit 1 week to repeat CMP; as scheduled for chronic OV.    [name], PA-C

## 2021-04-30 ENCOUNTER — Other Ambulatory Visit: Payer: Self-pay

## 2021-04-30 DIAGNOSIS — I10 Essential (primary) hypertension: Secondary | ICD-10-CM

## 2021-05-01 ENCOUNTER — Other Ambulatory Visit: Payer: Medicare HMO

## 2021-05-01 ENCOUNTER — Other Ambulatory Visit: Payer: Self-pay

## 2021-05-01 DIAGNOSIS — I10 Essential (primary) hypertension: Secondary | ICD-10-CM

## 2021-05-02 LAB — COMPREHENSIVE METABOLIC PANEL
ALT: 24 IU/L (ref 0–32)
AST: 19 IU/L (ref 0–40)
Albumin/Globulin Ratio: 1.5 (ref 1.2–2.2)
Albumin: 3.8 g/dL (ref 3.7–4.7)
Alkaline Phosphatase: 69 IU/L (ref 44–121)
BUN/Creatinine Ratio: 19 (ref 12–28)
BUN: 19 mg/dL (ref 8–27)
Bilirubin Total: 0.3 mg/dL (ref 0.0–1.2)
CO2: 21 mmol/L (ref 20–29)
Calcium: 9.3 mg/dL (ref 8.7–10.3)
Chloride: 102 mmol/L (ref 96–106)
Creatinine, Ser: 1.01 mg/dL — ABNORMAL HIGH (ref 0.57–1.00)
Globulin, Total: 2.5 g/dL (ref 1.5–4.5)
Glucose: 292 mg/dL — ABNORMAL HIGH (ref 70–99)
Potassium: 5.3 mmol/L — ABNORMAL HIGH (ref 3.5–5.2)
Sodium: 137 mmol/L (ref 134–144)
Total Protein: 6.3 g/dL (ref 6.0–8.5)
eGFR: 56 mL/min/{1.73_m2} — ABNORMAL LOW (ref >59)

## 2021-05-05 ENCOUNTER — Other Ambulatory Visit: Payer: Self-pay

## 2021-05-05 DIAGNOSIS — E119 Type 2 diabetes mellitus without complications: Secondary | ICD-10-CM

## 2021-05-05 MED ORDER — GLIPIZIDE 5 MG PO TABS: ORAL_TABLET | ORAL | 0 refills | Status: DC

## 2021-05-06 ENCOUNTER — Ambulatory Visit
Admission: RE | Admit: 2021-05-06 | Discharge: 2021-05-06 | Disposition: A | Discharge status: Home / Self Care | Payer: Medicare HMO | Source: Ambulatory Visit | Attending: Physician Assistant | Admitting: Physician Assistant

## 2021-05-06 DIAGNOSIS — Z780 Asymptomatic menopausal state: Secondary | ICD-10-CM

## 2021-05-06 DIAGNOSIS — E2839 Other primary ovarian failure: Secondary | ICD-10-CM

## 2021-05-06 DIAGNOSIS — M8589 Other specified disorders of bone density and structure, multiple sites: Secondary | ICD-10-CM | POA: Diagnosis not present

## 2021-05-27 ENCOUNTER — Other Ambulatory Visit: Payer: Self-pay | Admitting: Physician Assistant

## 2021-05-27 ENCOUNTER — Telehealth: Payer: Self-pay | Admitting: Pulmonary Disease

## 2021-05-27 DIAGNOSIS — E785 Hyperlipidemia, unspecified: Secondary | ICD-10-CM

## 2021-05-27 MED ORDER — TRELEGY ELLIPTA 100-62.5-25 MCG/ACT IN AEPB: 1.0000 | INHALATION_SPRAY | Freq: Every day | RESPIRATORY_TRACT | 5 refills | Status: DC

## 2021-05-27 NOTE — Telephone Encounter (Signed)
Next Visit: 08/26/2021  Last Visit: 03/24/2021  Last Fill: 03/03/2021  DX: Seropositive rheumatoid arthritis of multiple sites   Current Dose per office note 03/24/2021: Arava 10 mg alternating with 20 mg every other day  Labs: 04/22/2021 RBC count, hgb, and hct are borderline elevated.  Rest of CBC WNL.  We will continue to monitor. Creatinine is elevated-1.08 and GFR is slightly low-52.  Glucose was very elevated-398.  Potassium is borderline elevated-5.4.  Okay to refill Arava?

## 2021-05-27 NOTE — Telephone Encounter (Signed)
Rx for pt's Trelegy inhaler has been sent to preferred pharmacy for pt. Attempted to call pt to let her know this had been done but unable to reach. Left pt a detailed message letting her know that this had been done. Nothing further needed.

## 2021-06-02 ENCOUNTER — Encounter: Payer: Self-pay | Admitting: Physician Assistant

## 2021-06-02 ENCOUNTER — Other Ambulatory Visit: Payer: Self-pay

## 2021-06-02 ENCOUNTER — Ambulatory Visit (INDEPENDENT_AMBULATORY_CARE_PROVIDER_SITE_OTHER): Payer: Medicare HMO | Admitting: Physician Assistant

## 2021-06-02 VITALS — BP 112/74 | HR 88 | Temp 98.0°F | Ht 67.0 in | Wt 235.0 lb

## 2021-06-02 DIAGNOSIS — E785 Hyperlipidemia, unspecified: Secondary | ICD-10-CM

## 2021-06-02 DIAGNOSIS — E1165 Type 2 diabetes mellitus with hyperglycemia: Secondary | ICD-10-CM

## 2021-06-02 DIAGNOSIS — W19XXXA Unspecified fall, initial encounter: Secondary | ICD-10-CM | POA: Diagnosis not present

## 2021-06-02 DIAGNOSIS — I10 Essential (primary) hypertension: Secondary | ICD-10-CM

## 2021-06-02 MED ORDER — OZEMPIC (0.25 OR 0.5 MG/DOSE) 2 MG/1.5ML ~~LOC~~ SOPN: 0.5000 mg | PEN_INJECTOR | SUBCUTANEOUS | 1 refills | Status: DC

## 2021-06-02 NOTE — Assessment & Plan Note (Signed)
>>  ASSESSMENT AND PLAN FOR ESSENTIAL HYPERTENSION WRITTEN ON 06/02/2021  3:26 PM BY ABONZA, [name], PA-C  -BP elevated in office, BP repeated and improved. -Recommend to continue ambulatory BP monitoring. Continue current medication regimen. Patient obtains labs with Rheumatology every months and will have upcoming blood-work. -Will continue to monitor.

## 2021-06-02 NOTE — Patient Instructions (Signed)

## 2021-06-02 NOTE — Assessment & Plan Note (Addendum)
-  BP elevated in office, BP repeated and improved. -Recommend to continue ambulatory BP monitoring. Continue current medication regimen. Patient obtains labs with Rheumatology every months and will have upcoming blood-work. -Will continue to monitor.

## 2021-06-02 NOTE — Progress Notes (Signed)
Established Patient Office Visit  Subjective:  Patient ID: Tina Hansen, female    DOB: 20-Oct-1940  Age: 80 y.o. MRN: [id number]  CC:  Chief Complaint  Patient presents with   Follow-up   Diabetes   Hyperlipidemia    HPI Tina Hansen presents for follow up on diabetes mellitus, hyperlipidemia and hypertension. Reports about 3 weeks had a fall (got dizzy when walking) and fell on her knees. States knees no longer hurt and has full mobility. Left knee swelling has improved.  Diabetes: Pt denies increased urination or thirst, resolved. Pt reports medication compliance, started Ozempic 0.25 mg about 2 weeks ago, states had trouble administering injection but has resolved the issue. No hypoglycemic events. Checking glucose at home. FBS have improved and are running in 120s. Has worked on reducing carbohydrates and sweets.  HLD: Pt trying to manage to diet.  Continue with dietary changes of increasing salads and vegetables.  Does report likes to eat significant amount of cheese.  HTN: Pt denies chest pain, palpitations, dizziness or worsening lower extremity swelling. Taking medication as directed without side effects, does report was out of medication. Checks BP at home times/wk and readings average 135s/84.    Past Medical History:  Diagnosis Date   Asthma    Cervical cancer ([place])    Emphysema lung ([place])    Hyperlipidemia    Hypertension    Rheumatoid arthritis(714.0)    [name]    Past Surgical History:  Procedure Laterality Date   ABDOMINAL HYSTERECTOMY     BREAST SURGERY     Breat implants     LIPOMA EXCISION Left    shoulder    TUBAL LIGATION     TUMOR REMOVAL Right    FOREARM    Family History  Problem Relation Age of Onset   Asthma Mother    Breast cancer Mother    Cancer Mother    Alcohol abuse Father    Diabetes Son    Cancer Maternal Uncle    Heart attack Maternal Uncle     Social History   Socioeconomic History   Marital status:  Divorced    Spouse name: Not on file   Number of children: 1   Years of education: Not on file   Highest education level: Not on file  Occupational History   Occupation: retired  Tobacco Use   Smoking status: Former    Packs/day: 1.00    Years: 30.00    Pack years: 30.00    Types: Cigarettes    Quit date: 06/22/1985    Years since quitting: 35.9   Smokeless tobacco: Never  Vaping Use   Vaping Use: Never used  Substance and Sexual Activity   Alcohol use: Yes    Comment: OCCASSIONAL GLASS OF WINE 1-2 MONTHLY   Drug use: No   Sexual activity: Never  Other Topics Concern   Not on file  Social History Narrative   Not on file   Social Determinants of Health   Financial Resource Strain: Not on file  Food Insecurity: Not on file  Transportation Needs: Not on file  Physical Activity: Not on file  Stress: Not on file  Social Connections: Not on file  Intimate Partner Violence: Not on file    Outpatient Medications Prior to Visit  Medication Sig Dispense Refill   albuterol (PROVENTIL) (2.5 MG/3ML) 0.083% nebulizer solution USE ONE VIAL IN NEBULIZER EVERY 6 HOURS AS NEEDED 75 mL 2   albuterol (VENTOLIN HFA) 108 (90 Base)  MCG/ACT inhaler INHALE 1 TO 2 PUFFS BY MOUTH EVERY 6 HOURS AS NEEDED FOR WHEEZING AND FOR SHORTNESS OF BREATH 18 g 3   Ascorbic Acid (VITAMIN C PO) Take by mouth daily.     Biotin 5000 MCG CAPS Take 10,000 mcg by mouth daily.     calcium gluconate 500 MG tablet Take 500 mg by mouth daily.     Cholecalciferol (VITAMIN D3) 1000 units CAPS Take 1 capsule by mouth daily.      Chromium 1 MG CAPS Take 1 mg by mouth daily. Increase to 2 caps if needed     Coenzyme Q10 (COQ-10) 100 MG CAPS Take 1 capsule by mouth daily.     ECHINACEA PO Take by mouth daily.     fish oil-omega-3 fatty acids 1000 MG capsule Take 2 capsules by mouth daily.     Fluticasone-Umeclidin-Vilant (TRELEGY ELLIPTA) 100-62.5-25 MCG/ACT AEPB Inhale 1 puff into the lungs daily. 60 each 5   glipiZIDE  (GLUCOTROL) 5 MG tablet Take 2.5mg  (one half tablet) by mouth before eating supper 15 tablet 0   leflunomide (ARAVA) 20 MG tablet TAKE 10 MG TABLET BY MOUTH ALTERNATING WITH 20 MG EVERY OTHER DAY 68 tablet 0   LUTEIN PO Take by mouth daily.     Methylsulfonylmethane (MSM PO) Take by mouth daily.     Milk Thistle 200 MG CAPS Take 400 mg by mouth daily.     Multiple Vitamin (MULTIVITAMIN) capsule Take 1 capsule by mouth daily.     NON FORMULARY Take 1 capsule by mouth daily. Collagen capsule     valsartan (DIOVAN) 160 MG tablet TAKE 1/2 (ONE-HALF) TABLET BY MOUTH ONCE DAILY FOR  ONE  WEEK,  THEN  TAKE  ONE  TABLET  DAILY 90 tablet 0   vitamin E 180 MG (400 UNITS) capsule Take 400 Units by mouth daily.     Semaglutide,0.25 or 0.5MG /DOS, (OZEMPIC, 0.25 OR 0.5 MG/DOSE,) 2 MG/1.5ML SOPN Inject 0.25 mg into skin once a week x 4 weeks. Then inject 0.5 mg into skin once a week. 3 mL 0   No facility-administered medications prior to visit.    Allergies  Allergen Reactions   Other     Lamisil, vinyl or polyvinyl chloride in shoes and leather gloves    Theophylline     REACTION: fever    ROS Review of Systems Review of Systems:  A fourteen system review of systems was performed and found to be positive as per HPI.  Objective:   Physical Exam General:  Well Developed, well nourished, appropriate for stated age.  Neuro:  Alert and oriented,  extra-ocular muscles intact  HEENT:  Normocephalic, atraumatic, neck supple Skin:  no gross rash, warm, pink. Cardiac:  RRR, S1 S2 Respiratory: +wheezing and mild rhonchi, no crackles or rales, no respiratory distress  MSK: Good ROM of LE, 2+ patellar reflex b/l, no deformity of bilateral knees noted, mild swelling of left knee with healed bruise over anterior knee  Vascular:  Ext warm, no cyanosis apprec.; cap RF less 2 sec. No pitting edema  Psych:  No HI/SI, judgement and insight good, Euthymic mood. Full Affect.  BP 112/74   Pulse 88   Temp 98 F  (36.7 C)   Ht 5\' 7"  (1.702 m)   Wt 235 lb (106.6 kg)   SpO2 98%   BMI 36.81 kg/m  Wt Readings from Last 3 Encounters:  06/02/21 235 lb (106.6 kg)  04/24/21 233 lb (105.7 kg)  03/24/21 240  lb 9.6 oz (109.1 kg)     Health Maintenance Due  Topic Date Due   COVID-19 Vaccine (1) Never done   TETANUS/TDAP  Never done   Zoster Vaccines- Shingrix (2 of 2) 07/06/2019   INFLUENZA VACCINE  Never done    There are no preventive care reminders to display for this patient.  Lab Results  Component Value Date   TSH 4.560 (H) 03/20/2019   Lab Results  Component Value Date   WBC 7.5 04/22/2021   HGB 15.6 (H) 04/22/2021   HCT 47.2 (H) 04/22/2021   MCV 88.1 04/22/2021   PLT 211 04/22/2021   Lab Results  Component Value Date   NA 137 05/01/2021   K 5.3 (H) 05/01/2021   CO2 21 05/01/2021   GLUCOSE 292 (H) 05/01/2021   BUN 19 05/01/2021   CREATININE 1.01 (H) 05/01/2021   BILITOT 0.3 05/01/2021   ALKPHOS 69 05/01/2021   AST 19 05/01/2021   ALT 24 05/01/2021   PROT 6.3 05/01/2021   ALBUMIN 3.8 05/01/2021   CALCIUM 9.3 05/01/2021   EGFR 56 (L) 05/01/2021   Lab Results  Component Value Date   CHOL 252 (H) 02/18/2021   Lab Results  Component Value Date   HDL 65 02/18/2021   Lab Results  Component Value Date   LDLCALC 160 (H) 02/18/2021   Lab Results  Component Value Date   TRIG 148 02/18/2021   Lab Results  Component Value Date   CHOLHDL 3.9 02/18/2021   Lab Results  Component Value Date   HGBA1C 9.6 (A) 04/24/2021      Assessment & Plan:   Problem List Items Addressed This Visit       Cardiovascular and Mediastinum   Essential hypertension    -BP elevated in office, BP repeated and improved. -Recommend to continue ambulatory BP monitoring. Continue current medication regimen. Patient obtains labs with Rheumatology every months and will have upcoming blood-work. -Will continue to monitor.        Other   Hyperlipidemia    -Last lipid panel: Total  cholesterol 252, triglycerides 148, HDL 65, LDL 160 -Patient repeatedly declines statin therapy. -Recommend to continue with dietary changes and reduce saturated fat intake including dairy products. -Will continue to monitor.      Other Visit Diagnoses     Uncontrolled type 2 diabetes mellitus with hyperglycemia ([place])    -  Primary   Relevant Medications   Semaglutide,0.25 or 0.5MG /DOS, (OZEMPIC, 0.25 OR 0.5 MG/DOSE,) 2 MG/1.5ML SOPN   Fall, initial encounter          Uncontrolled type 2 diabetes mellitus with hyperglycemia: -A1c 04/24/2021 9.6, will repeat A1c in 3 months.  Ambulatory glucose readings have improved since starting medication therapy.  Recommend to continue Ozempic 0.25 mg for 2 more weeks and then start 0.5 mg weekly and glipizide 2.5 mg before supper.  Continue to monitor glucose including for hypoglycemic events.  Discussed referral to diabetic nutritionist, patient not interested at this time.  Recommend to continue with dietary changes. -Will continue to monitor.  Fall, initial encounter: -Knees are improving. Recommend ice therapy.  -Recommend to stay well hydrated and slow position changes to avoid getting dizzy (pt has hx of vertigo).   Meds ordered this encounter  Medications   Semaglutide,0.25 or 0.5MG /DOS, (OZEMPIC, 0.25 OR 0.5 MG/DOSE,) 2 MG/1.5ML SOPN    Sig: Inject 0.5 mg into the skin once a week.    Dispense:  3 mL    Refill:  1  Order Specific Question:   Supervising Provider    Answer:   Beatrice Lecher D [[id number]]     Follow-up: Return in about 3 months (around 08/31/2021) for DM, HTN, HLD.    [name], PA-C

## 2021-06-02 NOTE — Assessment & Plan Note (Addendum)
-  Last lipid panel: Total cholesterol 252, triglycerides 148, HDL 65, LDL 160 -Patient repeatedly declines statin therapy. -Recommend to continue with dietary changes and reduce saturated fat intake including dairy products. -Will continue to monitor.

## 2021-06-05 ENCOUNTER — Other Ambulatory Visit: Payer: Self-pay | Admitting: Physician Assistant

## 2021-06-05 DIAGNOSIS — E119 Type 2 diabetes mellitus without complications: Secondary | ICD-10-CM

## 2021-06-06 ENCOUNTER — Encounter: Payer: Self-pay | Admitting: Pulmonary Disease

## 2021-06-06 ENCOUNTER — Ambulatory Visit: Payer: Medicare HMO | Admitting: Pulmonary Disease

## 2021-06-06 ENCOUNTER — Other Ambulatory Visit: Payer: Self-pay

## 2021-06-06 VITALS — BP 136/74 | HR 91 | Temp 97.7°F | Ht 66.0 in | Wt 237.6 lb

## 2021-06-06 DIAGNOSIS — J449 Chronic obstructive pulmonary disease, unspecified: Secondary | ICD-10-CM | POA: Diagnosis not present

## 2021-06-06 MED ORDER — TRELEGY ELLIPTA 100-62.5-25 MCG/ACT IN AEPB: 1.0000 | INHALATION_SPRAY | Freq: Every day | RESPIRATORY_TRACT | 5 refills | Status: DC

## 2021-06-06 NOTE — Progress Notes (Signed)
SECRET KRISTENSEN    [id number]    1940/09/04  Primary Care Physician:Abonza, Samantha Crimes  Referring Physician: [name], PA-C [address] [place],  [place] [address]  Chief complaint: Follow-up for COPD  HPI: 80 year old with vocal cord dysfunction, obesity, COPD, rheumatoid arthritis Previously followed with Dr. [name].  Maintained on Symbicort for several years with stable symptoms Has chronic dyspnea on exertion.  Denies any cough, sputum production  Follows with Dr. [name] for seronegative rheumatoid arthritis Methotrexate stopped and she was changed to [facility] by rheumatology in August 2021.  She is tolerating it well so far  Pets: No pets Occupation: Retired Geophysicist/field seismologist Exposures: No known exposures, no mold, hot tub, Jacuzzi Smoking history: 30-pack-year smoker.  Quit in 1987 Travel history: No significant travel history Relevant family history: No significant family history of lung disease  Interval history: Symbicort stopped and she was started on Trelegy at last visit.  She feels that the new inhaler is working much better for her Dyspnea stable with no issues  Outpatient Encounter Medications as of 06/06/2021  Medication Sig   albuterol (PROVENTIL) (2.5 MG/3ML) 0.083% nebulizer solution USE ONE VIAL IN NEBULIZER EVERY 6 HOURS AS NEEDED   albuterol (VENTOLIN HFA) 108 (90 Base) MCG/ACT inhaler INHALE 1 TO 2 PUFFS BY MOUTH EVERY 6 HOURS AS NEEDED FOR WHEEZING AND FOR SHORTNESS OF BREATH   Ascorbic Acid (VITAMIN C PO) Take by mouth daily.   Biotin 5000 MCG CAPS Take 10,000 mcg by mouth daily.   calcium gluconate 500 MG tablet Take 500 mg by mouth daily.   Cholecalciferol (VITAMIN D3) 1000 units CAPS Take 1 capsule by mouth daily.    Chromium 1 MG CAPS Take 1 mg by mouth daily. Increase to 2 caps if needed   Coenzyme Q10 (COQ-10) 100 MG CAPS Take 1 capsule by mouth daily.   ECHINACEA PO Take by mouth daily.   fish oil-omega-3 fatty  acids 1000 MG capsule Take 2 capsules by mouth daily.   Fluticasone-Umeclidin-Vilant (TRELEGY ELLIPTA) 100-62.5-25 MCG/ACT AEPB Inhale 1 puff into the lungs daily.   glipiZIDE (GLUCOTROL) 5 MG tablet TAKE ONE-HALF TABLET BY MOUTH BEFORE EATING SUPPER   leflunomide (ARAVA) 20 MG tablet TAKE 10 MG TABLET BY MOUTH ALTERNATING WITH 20 MG EVERY OTHER DAY   LUTEIN PO Take by mouth daily.   Methylsulfonylmethane (MSM PO) Take by mouth daily.   Milk Thistle 200 MG CAPS Take 400 mg by mouth daily.   Multiple Vitamin (MULTIVITAMIN) capsule Take 1 capsule by mouth daily.   NON FORMULARY Take 1 capsule by mouth daily. Collagen capsule   Semaglutide,0.25 or 0.5MG /DOS, (OZEMPIC, 0.25 OR 0.5 MG/DOSE,) 2 MG/1.5ML SOPN Inject 0.5 mg into the skin once a week.   valsartan (DIOVAN) 160 MG tablet TAKE 1/2 (ONE-HALF) TABLET BY MOUTH ONCE DAILY FOR  ONE  WEEK,  THEN  TAKE  ONE  TABLET  DAILY   vitamin E 180 MG (400 UNITS) capsule Take 400 Units by mouth daily.   No facility-administered encounter medications on file as of 06/06/2021.    Physical Exam: Blood pressure 136/74, pulse 91, temperature 97.7 F (36.5 C), temperature source Oral, height 5\' 6"  (1.676 m), weight 237 lb 9.6 oz (107.8 kg), SpO2 96 %. Gen:      No acute distress HEENT:  EOMI, sclera anicteric Neck:     No masses; no thyromegaly Lungs:    Clear to auscultation bilaterally; normal respiratory effort CV:  Regular rate and rhythm; no murmurs Abd:      + bowel sounds; soft, non-tender; no palpable masses, no distension Ext:    No edema; adequate peripheral perfusion Skin:      Warm and dry; no rash Neuro: alert and oriented x 3 Psych: normal mood and affect   Data Reviewed: Imaging: Chest x-ray 06/28/2017- hyperinflated lungs.  No acute cardiopulmonary abnormality. Chest x-ray 12/29/2018-hyperinflated lungs.  No acute abnormality Chest x-ray 03/15/2020-emphysema.  No acute cardiopulmonary abnormality. I have reviewed the images  personally.  PFTs: 05/09/2015 FVC 2 [65%), FEV1 1 [43%], F/F 49 Severe obstruction  06/07/2020 FVC 2.33 [1%], FEV1 1.20 [55%], F/F 52, TLC 5.68 [101%], DLCO 13.58 [65%] Severe obstruction, moderate diffusion defect  Assessment:  Severe COPD Symptoms improved with Symbicort was changed to Trelegy Continue current therapy  Seronegative rheumatoid arthritis No evidence of interstitial lung disease in the past She would like to avoid CT scan of the chest to limit radiation exposure. PFTs did not show any restriction  Plan/Recommendations: - Continue trelegy  Follow-up in 1 year  Marshell Garfinkel MD [facility] 06/06/2021, 2:30 PM  CC: [name], PA-C

## 2021-06-06 NOTE — Patient Instructions (Signed)
I am glad you are doing well with your breathing We will renew the Trelegy inhaler for 1 year Follow-up in 1 year.

## 2021-07-02 ENCOUNTER — Other Ambulatory Visit: Payer: Self-pay | Admitting: Physician Assistant

## 2021-07-02 DIAGNOSIS — E119 Type 2 diabetes mellitus without complications: Secondary | ICD-10-CM

## 2021-08-05 DIAGNOSIS — E119 Type 2 diabetes mellitus without complications: Secondary | ICD-10-CM | POA: Diagnosis not present

## 2021-08-12 NOTE — Progress Notes (Signed)
Office Visit Note  Patient: Tina Hansen             Date of Birth: 1940/07/12           MRN: [id number]             PCP: [name], PA-C Referring: [name], PA-C Visit Date: 08/26/2021 Occupation: @GUAROCC @  Subjective:  Medication management.   History of Present Illness: Tina Hansen is a 81 y.o. female with history of rheumatoid arthritis, and osteoarthritis.  She states that in December she fell which she believes was due to vertigo.  She landed on her knees on hardwood floor.  She did not see anyone after the fall.  She states the knee pain improved over time.  She continues to have some numbness in the left knee and numbness in her bilateral feet.  Arthritis is well controlled on leflunomide.  She denies any joint swelling or inflammation.  Activities of Daily Living:  Patient reports morning stiffness for 0 minutes.   Patient Denies nocturnal pain.  Difficulty dressing/grooming: Denies Difficulty climbing stairs: Reports Difficulty getting out of chair: Denies Difficulty using hands for taps, buttons, cutlery, and/or writing: Denies  Review of Systems  Constitutional:  Negative for fatigue.  HENT:  Positive for mouth dryness. Negative for mouth sores and nose dryness.   Eyes:  Negative for pain, itching and dryness.  Respiratory:  Positive for shortness of breath. Negative for difficulty breathing.   Cardiovascular:  Negative for chest pain and palpitations.  Gastrointestinal:  Negative for blood in stool, constipation and diarrhea.  Endocrine: Positive for increased urination.  Genitourinary:  Negative for difficulty urinating.  Musculoskeletal:  Negative for joint pain, joint pain, joint swelling, myalgias, morning stiffness, muscle tenderness and myalgias.  Skin:  Negative for color change, rash and redness.  Allergic/Immunologic: Negative for susceptible to infections.  Neurological:  Positive for dizziness and numbness. Negative for  headaches and weakness.  Hematological:  Negative for bruising/bleeding tendency.  Psychiatric/Behavioral:  Negative for confusion.    PMFS History:  Patient Active Problem List   Diagnosis Date Noted   Pain 09/25/2019   Fatigue 09/25/2019   Statin declined 08/21/2019   Non compliance with medical treatment 08/21/2019   Cardiovascular disease 03/24/2019   Elevated LDL cholesterol level 11/07/2018   Pre-diabetes 11/07/2018   BMI 40.0-44.9, adult ([place]) 08/08/2018   Onychomycosis 04/06/2018   Decreased GFR 01/05/2018   Healthcare maintenance 09/08/2017   Lipoma of left upper extremity 08/09/2017   COPD mixed type ([place]) 06/28/2017   History of COPD 06/26/2016   History of hypertension 06/26/2016   Osteopenia of multiple sites 06/26/2016   Morbid obesity ([place]) 05/04/2016   High risk medication use 04/25/2016   Vocal cord dysfunction 04/29/2015   Seropositive rheumatoid arthritis of multiple sites ([place]) 04/29/2015   Hyperlipidemia 10/06/2007   Essential hypertension 10/06/2007   OTHER DISEASES OF VOCAL CORDS 10/06/2007   Dyspnea on exertion 10/06/2007   CERVICAL CANCER, HX OF 10/06/2007    Past Medical History:  Diagnosis Date   Asthma    Cervical cancer ([place])    Diabetes ([place])    Emphysema lung ([place])    Hyperlipidemia    Hypertension    Rheumatoid arthritis(714.0)    [place]dal[facility] Mcqueary    Family History  Problem Relation Age of Onset   Asthma Mother    Breast cancer Mother    Cancer Mother    Alcohol abuse Father    Diabetes Son    Cancer  Maternal Uncle    Heart attack Maternal Uncle    Past Surgical History:  Procedure Laterality Date   ABDOMINAL HYSTERECTOMY     BREAST SURGERY     Breat implants     LIPOMA EXCISION Left    shoulder    TUBAL LIGATION     TUMOR REMOVAL Right    FOREARM   Social History   Social History Narrative   Not on file   Immunization History  Administered Date(s) Administered   Pneumococcal Conjugate-13 04/06/2018   Pneumococcal  Polysaccharide-23 06/27/2007, 03/22/2009   Zoster Recombinat (Shingrix) 05/11/2019   Zoster, Live 05/11/2019     Objective: Vital Signs: BP (!) 173/82 (BP Location: Left Wrist, Patient Position: Sitting, Cuff Size: Normal)    Pulse 96    Ht 5\' 6"  (1.676 m)    Wt 235 lb (106.6 kg)    BMI 37.93 kg/m    Physical Exam Vitals and nursing note reviewed.  Constitutional:      Appearance: She is well-developed.  HENT:     Head: Normocephalic and atraumatic.  Eyes:     Conjunctiva/sclera: Conjunctivae normal.  Cardiovascular:     Rate and Rhythm: Normal rate and regular rhythm.     Heart sounds: Normal heart sounds.  Pulmonary:     Effort: Pulmonary effort is normal.     Breath sounds: Normal breath sounds.  Abdominal:     General: Bowel sounds are normal.     Palpations: Abdomen is soft.  Musculoskeletal:     Cervical back: Normal range of motion.  Lymphadenopathy:     Cervical: No cervical adenopathy.  Skin:    General: Skin is warm and dry.     Capillary Refill: Capillary refill takes less than 2 seconds.  Neurological:     Mental Status: She is alert and oriented to person, place, and time.  Psychiatric:        Behavior: Behavior normal.     Musculoskeletal Exam: C-spine was in good range of motion.  Shoulder joints and elbow joints with good range of motion.  She had no synovitis over wrist joints, MCPs.  PIP and DIP thickening was noted.  Hip joints and knee joints were in good range of motion.  She had no warmth swelling or effusion on her knee joints.  There was no tenderness over ankles or MTPs.  CDAI Exam: CDAI Score: 0.2  Patient Global: 1 mm; Provider Global: 1 mm Swollen: 0 ; Tender: 0  Joint Exam 08/26/2021   No joint exam has been documented for this visit   There is currently no information documented on the homunculus. Go to the Rheumatology activity and complete the homunculus joint exam.  Investigation: No additional findings.  Imaging: No results  found.  Recent Labs: Lab Results  Component Value Date   WBC 7.5 04/22/2021   HGB 15.6 (H) 04/22/2021   PLT 211 04/22/2021   NA 137 05/01/2021   K 5.3 (H) 05/01/2021   CL 102 05/01/2021   CO2 21 05/01/2021   GLUCOSE 292 (H) 05/01/2021   BUN 19 05/01/2021   CREATININE 1.01 (H) 05/01/2021   BILITOT 0.3 05/01/2021   ALKPHOS 69 05/01/2021   AST 19 05/01/2021   ALT 24 05/01/2021   PROT 6.3 05/01/2021   ALBUMIN 3.8 05/01/2021   CALCIUM 9.3 05/01/2021   GFRAA 76 10/22/2020    Speciality Comments: Methotrexate discontinued due to elevated creatinine.  Arava start date February 06, 2020  Procedures:  No procedures performed  Allergies: Other and Theophylline   Assessment / Plan:     Visit Diagnoses: Seropositive rheumatoid arthritis of multiple sites ([place]) - +RF, +CCP, h/o elevated sed rate: Rheumatoid arthritis appears to be well controlled without any synovitis.  She has been tolerating leflunomide without any side effects.  High risk medication use - Arava 10 mg alternating with 20 mg every other day.  -We will obtain labs today.  Last labs were in November which were within normal limits.  Creatinine was mildly elevated.  She was advised to stop leflunomide in case she develops an infection and resume after the infection resolves.  Information about immunization was placed in the AVS.  Plan: CBC with Differential/Platelet, COMPLETE METABOLIC PANEL WITH GFR  Primary osteoarthritis of both hands-joint protection muscle strengthening was discussed  Trochanteric bursitis, right hip - Resolved.  Osteopenia of multiple sites - DEXA 05/06/2021: The BMD measured at Forearm Radius 33% is 0.739 g/cm2 with a T-1.7 scoreof -1.7.  DEXA findings were discussed.  Use of calcium rich diet was discussed.  Regular exercise was emphasized  Vitamin D deficiency-he is on vitamin D supplement.  History of hypertension-blood pressure was elevated today.  Patient states it was due to stress.  Her blood  pressure has been normal otherwise.  History of hyperlipidemia-dietary modifications were discussed.  History of diabetes mellitus-she was recently diagnosed with diabetes.  Neuropathy-she complains of numbness in her bilateral fifth toes.  She believes is related to the fall in December.  I offered referral for nerve conduction velocity which she declined.  Vertigo-patient states that she has history of vertigo for many years.  History of recent fall-she fell in December which was related to vertigo per patient.  She landed up on her knees.  No warmth swelling or effusion was noted.  Vocal cord dysfunction-related to prior medication use per patient.  History of COPD  Orders: Orders Placed This Encounter  Procedures   CBC with Differential/Platelet   COMPLETE METABOLIC PANEL WITH GFR   No orders of the defined types were placed in this encounter.    Follow-Up Instructions: Return in about 5 months (around 01/26/2022) for Rheumatoid arthritis, Osteoarthritis.   Bo Merino, MD  Note - This record has been created using Editor, commissioning.  Chart creation errors have been sought, but may not always  have been located. Such creation errors do not reflect on  the standard of medical care.

## 2021-08-26 ENCOUNTER — Ambulatory Visit: Payer: Medicare HMO | Admitting: Rheumatology

## 2021-08-26 ENCOUNTER — Encounter: Payer: Self-pay | Admitting: Rheumatology

## 2021-08-26 ENCOUNTER — Other Ambulatory Visit: Payer: Self-pay

## 2021-08-26 VITALS — BP 173/82 | HR 96 | Ht 66.0 in | Wt 235.0 lb

## 2021-08-26 DIAGNOSIS — M8589 Other specified disorders of bone density and structure, multiple sites: Secondary | ICD-10-CM | POA: Diagnosis not present

## 2021-08-26 DIAGNOSIS — M19041 Primary osteoarthritis, right hand: Secondary | ICD-10-CM | POA: Diagnosis not present

## 2021-08-26 DIAGNOSIS — M0579 Rheumatoid arthritis with rheumatoid factor of multiple sites without organ or systems involvement: Secondary | ICD-10-CM | POA: Diagnosis not present

## 2021-08-26 DIAGNOSIS — E559 Vitamin D deficiency, unspecified: Secondary | ICD-10-CM

## 2021-08-26 DIAGNOSIS — M7061 Trochanteric bursitis, right hip: Secondary | ICD-10-CM

## 2021-08-26 DIAGNOSIS — G629 Polyneuropathy, unspecified: Secondary | ICD-10-CM

## 2021-08-26 DIAGNOSIS — J383 Other diseases of vocal cords: Secondary | ICD-10-CM

## 2021-08-26 DIAGNOSIS — Z8679 Personal history of other diseases of the circulatory system: Secondary | ICD-10-CM

## 2021-08-26 DIAGNOSIS — R7303 Prediabetes: Secondary | ICD-10-CM

## 2021-08-26 DIAGNOSIS — Z8639 Personal history of other endocrine, nutritional and metabolic disease: Secondary | ICD-10-CM | POA: Diagnosis not present

## 2021-08-26 DIAGNOSIS — R42 Dizziness and giddiness: Secondary | ICD-10-CM | POA: Diagnosis not present

## 2021-08-26 DIAGNOSIS — Z79899 Other long term (current) drug therapy: Secondary | ICD-10-CM | POA: Diagnosis not present

## 2021-08-26 DIAGNOSIS — M19042 Primary osteoarthritis, left hand: Secondary | ICD-10-CM

## 2021-08-26 DIAGNOSIS — Z9181 History of falling: Secondary | ICD-10-CM

## 2021-08-26 DIAGNOSIS — Z8709 Personal history of other diseases of the respiratory system: Secondary | ICD-10-CM

## 2021-08-26 LAB — CBC WITH DIFFERENTIAL/PLATELET
Absolute Monocytes: 624 cells/uL (ref 200–950)
Basophils Absolute: 72 cells/uL (ref 0–200)
Basophils Relative: 1.1 %
Eosinophils Absolute: 208 cells/uL (ref 15–500)
Eosinophils Relative: 3.2 %
HCT: 47.1 % — ABNORMAL HIGH (ref 35.0–45.0)
Hemoglobin: 15.2 g/dL (ref 11.7–15.5)
Lymphs Abs: 1209 cells/uL (ref 850–3900)
MCH: 28.3 pg (ref 27.0–33.0)
MCHC: 32.3 g/dL (ref 32.0–36.0)
MCV: 87.7 fL (ref 80.0–100.0)
MPV: 10.7 fL (ref 7.5–12.5)
Monocytes Relative: 9.6 %
Neutro Abs: 4388 cells/uL (ref 1500–7800)
Neutrophils Relative %: 67.5 %
Platelets: 220 10*3/uL (ref 140–400)
RBC: 5.37 10*6/uL — ABNORMAL HIGH (ref 3.80–5.10)
RDW: 12.8 % (ref 11.0–15.0)
Total Lymphocyte: 18.6 %
WBC: 6.5 10*3/uL (ref 3.8–10.8)

## 2021-08-26 LAB — COMPLETE METABOLIC PANEL WITH GFR
AG Ratio: 1.6 (calc) (ref 1.0–2.5)
ALT: 17 U/L (ref 6–29)
AST: 20 U/L (ref 10–35)
Albumin: 4.3 g/dL (ref 3.6–5.1)
Alkaline phosphatase (APISO): 57 U/L (ref 37–153)
BUN: 23 mg/dL (ref 7–25)
CO2: 29 mmol/L (ref 20–32)
Calcium: 10.1 mg/dL (ref 8.6–10.4)
Chloride: 104 mmol/L (ref 98–110)
Creat: 0.85 mg/dL (ref 0.60–0.95)
Globulin: 2.7 g/dL (calc) (ref 1.9–3.7)
Glucose, Bld: 131 mg/dL — ABNORMAL HIGH (ref 65–99)
Potassium: 5.1 mmol/L (ref 3.5–5.3)
Sodium: 139 mmol/L (ref 135–146)
Total Bilirubin: 0.5 mg/dL (ref 0.2–1.2)
Total Protein: 7 g/dL (ref 6.1–8.1)
eGFR: 69 mL/min/{1.73_m2} (ref >=60)

## 2021-08-26 NOTE — Patient Instructions (Signed)
Standing Labs ?We placed an order today for your standing lab work.  ? ?Please have your standing labs drawn in June and every 3 months ? ?If possible, please have your labs drawn 2 weeks prior to your appointment so that the provider can discuss your results at your appointment. ? ?Please note that you may see your imaging and lab results in MyChart before we have reviewed them. ?We may be awaiting multiple results to interpret others before contacting you. ?Please allow our office up to 72 hours to thoroughly review all of the results before contacting the office for clarification of your results. ? ?We have open lab daily: ?Monday through Thursday from 1:30-4:30 PM and Friday from 1:30-4:00 PM ?at the office of Dr. Raeden Belzer, [facility].   ?Please be advised, all patients with office appointments requiring lab work will take precedent over walk-in lab work.  ?If possible, please come for your lab work on Monday and Friday afternoons, as you may experience shorter wait times. ?The office is located at [address], [address], [place], [place] [address] ?No appointment is necessary.   ?Labs are drawn by [facility]. Please bring your co-pay at the time of your lab draw.  You may receive a bill from [facility] for your lab work. ? ?Please note if you are on Hydroxychloroquine and and an order has been placed for a Hydroxychloroquine level, you will need to have it drawn 4 hours or more after your last dose. ? ?If you wish to have your labs drawn at another location, please call the office 24 hours in advance to send orders. ? ?If you have any questions regarding directions or hours of operation,  ?please call [phone].   ?As a reminder, please drink plenty of water prior to coming for your lab work. Thanks!  ? ?Vaccines ?You are taking a medication(s) that can suppress your immune system.  The following immunizations are recommended: ?Flu annually ?Covid-19  ?Td/Tdap (tetanus, diphtheria, pertussis)  every 10 years ?Pneumonia (Prevnar 15 then Pneumovax 23 at least 1 year apart.  Alternatively, can take Prevnar 20 without needing additional dose) ?Shingrix: 2 doses from 4 weeks to 6 months apart ? ?Please check with your PCP to make sure you are up to date.  ? ?If you have signs or symptoms of an infection or start antibiotics: ?First, call your PCP for workup of your infection. ?Hold your medication through the infection, until you complete your antibiotics, and until symptoms resolve if you take the following: ?Injectable medication (Actemra, Benlysta, Cimzia, Cosentyx, Enbrel, Humira, Kevzara, Orencia, Remicade, Simponi, Stelara, Taltz, Tremfya) ?Methotrexate ?Leflunomide (Arava) ?Mycophenolate (Cellcept) ?Xeljanz, Olumiant, or Rinvoq  ?

## 2021-08-27 NOTE — Progress Notes (Signed)
BBC is normal.  CMP is normal except glucose is elevated.

## 2021-09-01 ENCOUNTER — Other Ambulatory Visit: Payer: Self-pay

## 2021-09-01 ENCOUNTER — Encounter: Payer: Self-pay | Admitting: Physician Assistant

## 2021-09-01 ENCOUNTER — Ambulatory Visit (INDEPENDENT_AMBULATORY_CARE_PROVIDER_SITE_OTHER): Payer: Medicare HMO | Admitting: Physician Assistant

## 2021-09-01 VITALS — BP 134/82 | HR 88 | Temp 98.0°F | Ht 66.0 in | Wt 235.0 lb

## 2021-09-01 DIAGNOSIS — E785 Hyperlipidemia, unspecified: Secondary | ICD-10-CM

## 2021-09-01 DIAGNOSIS — E1165 Type 2 diabetes mellitus with hyperglycemia: Secondary | ICD-10-CM | POA: Diagnosis not present

## 2021-09-01 DIAGNOSIS — I10 Essential (primary) hypertension: Secondary | ICD-10-CM | POA: Diagnosis not present

## 2021-09-01 DIAGNOSIS — J449 Chronic obstructive pulmonary disease, unspecified: Secondary | ICD-10-CM | POA: Diagnosis not present

## 2021-09-01 LAB — POCT GLYCOSYLATED HEMOGLOBIN (HGB A1C): Hemoglobin A1C: 5.8 % — AB (ref 4.0–5.6)

## 2021-09-01 NOTE — Assessment & Plan Note (Addendum)
-  A1c today has significantly improved from 9.6 to 5.8, will continue current medication regimen. See med list. Discussed monitoring carbohydrate and glucose intake. Recommend to continue ambulatory glucose monitoring. Will continue to monitor. ?

## 2021-09-01 NOTE — Assessment & Plan Note (Signed)
>>  ASSESSMENT AND PLAN FOR ESSENTIAL HYPERTENSION WRITTEN ON 09/01/2021  2:32 PM BY ABONZA, [name], PA-C  -BP elevated, BP repeated and improved. Stable. -Continue current medication regimen. -Will continue to monitor.

## 2021-09-01 NOTE — Assessment & Plan Note (Signed)
-  Followed by Pulmonology. 

## 2021-09-01 NOTE — Assessment & Plan Note (Signed)
-  Patient has repeatedly declined statin therapy. Managing with diet. Encourage weight loss as well (pt on GLP-1 therapy). Will continue to monitor. ?

## 2021-09-01 NOTE — Progress Notes (Signed)
?Established patient visit ? ? ?Patient: Tina Hansen   DOB: 1940-09-02   81 y.o. Female  MRN: [id number] ?Visit Date: 09/01/2021 ? ?Chief Complaint  ?Patient presents with  ? Follow-up  ? Diabetes  ? Hyperlipidemia  ? Hypertension  ? ?Subjective  ?  ?HPI  ?Patient presents for follow up on diabetes mellitus, hypertension and hyperlipidemia.  ? ?Diabetes: Pt denies increased urination or thirst. Pt reports medication compliance. No hypoglycemic events. Checking glucose at home. FBS range from 114-140, last couple of days FBS have been higher in the 200s range due to eating more bread.  ? ?HTN: Pt denies chest pain, palpitations, dizziness, syncope, or lower extremity swelling. Taking medication as directed without side effects.   ? ?HLD: Pt managing with diet. Not interested on medication therapy. ? ?COPD: Reports since being on Trelegy her breathing has been better. Still gets some shortness of breath with activity but able to do more.   ? ?Medications: ?Outpatient Medications Prior to Visit  ?Medication Sig  ? albuterol (PROVENTIL) (2.5 MG/3ML) 0.083% nebulizer solution USE ONE VIAL IN NEBULIZER EVERY 6 HOURS AS NEEDED  ? albuterol (VENTOLIN HFA) 108 (90 Base) MCG/ACT inhaler INHALE 1 TO 2 PUFFS BY MOUTH EVERY 6 HOURS AS NEEDED FOR WHEEZING AND FOR SHORTNESS OF BREATH  ? Ascorbic Acid (VITAMIN C PO) Take by mouth daily.  ? Biotin 5000 MCG CAPS Take 10,000 mcg by mouth daily.  ? calcium gluconate 500 MG tablet Take 500 mg by mouth daily.  ? Cholecalciferol (VITAMIN D3) 1000 units CAPS Take 1 capsule by mouth daily.   ? Chromium 1 MG CAPS Take 1 mg by mouth daily. Increase to 2 caps if needed  ? Coenzyme Q10 (COQ-10) 100 MG CAPS Take 1 capsule by mouth daily.  ? ECHINACEA PO Take by mouth daily.  ? fish oil-omega-3 fatty acids 1000 MG capsule Take 2 capsules by mouth daily.  ? Fluticasone-Umeclidin-Vilant (TRELEGY ELLIPTA) 100-62.5-25 MCG/ACT AEPB Inhale 1 puff into the lungs daily.  ? glipiZIDE (GLUCOTROL) 5  MG tablet TAKE 1/2 (ONE-HALF) TABLET BY MOUTH ONCE DAILY BEFORE SUPPER  ? leflunomide (ARAVA) 20 MG tablet TAKE 10 MG TABLET BY MOUTH ALTERNATING WITH 20 MG EVERY OTHER DAY  ? LUTEIN PO Take by mouth daily.  ? Methylsulfonylmethane (MSM PO) Take by mouth daily.  ? Milk Thistle 200 MG CAPS Take 400 mg by mouth daily.  ? Multiple Vitamin (MULTIVITAMIN) capsule Take 1 capsule by mouth daily.  ? NON FORMULARY Take 1 capsule by mouth daily. Collagen capsule  ? Semaglutide,0.25 or 0.'5MG'$ /DOS, (OZEMPIC, 0.25 OR 0.5 MG/DOSE,) 2 MG/1.5ML SOPN Inject 0.5 mg into the skin once a week.  ? valsartan (DIOVAN) 160 MG tablet TAKE 1/2 (ONE-HALF) TABLET BY MOUTH ONCE DAILY FOR  ONE  WEEK,  THEN  TAKE  ONE  TABLET  DAILY  ? vitamin E 180 MG (400 UNITS) capsule Take 400 Units by mouth daily.  ? ?No facility-administered medications prior to visit.  ? ? ?Review of Systems ?Review of Systems:  ?A fourteen system review of systems was performed and found to be positive as per HPI. ? ? ?  Objective  ?  ?BP 134/82   Pulse 88   Temp 98 ?F (36.7 ?C)   Ht '5\' 6"'$  (1.676 m)   Wt 235 lb (106.6 kg)   SpO2 98%   BMI 37.93 kg/m?  ?BP Readings from Last 3 Encounters:  ?09/01/21 134/82  ?08/26/21 (!) 173/82  ?06/06/21 136/74  ? ?Wt  Readings from Last 3 Encounters:  ?09/01/21 235 lb (106.6 kg)  ?08/26/21 235 lb (106.6 kg)  ?06/06/21 237 lb 9.6 oz (107.8 kg)  ? ? ?Physical Exam  ?General:  Well Developed, well nourished, appropriate for stated age.  ?Neuro:  Alert and oriented,  extra-ocular muscles intact  ?HEENT:  Normocephalic, atraumatic, neck supple  ?Skin:  no gross rash, warm, pink. ?Cardiac:  RRR, S1 S2 ?Respiratory: CTA B/L  ?Vascular:  Ext warm, no cyanosis apprec.; cap RF less 2 sec. ?Psych:  No HI/SI, judgement and insight good, Euthymic mood. Full Affect. ? ? ?Results for orders placed or performed in visit on 09/01/21  ?POCT glycosylated hemoglobin (Hb A1C)  ?Result Value Ref Range  ? Hemoglobin A1C 5.8 (A) 4.0 - 5.6 %  ? HbA1c POC (<>  result, manual entry)    ? HbA1c, POC (prediabetic range)    ? HbA1c, POC (controlled diabetic range)    ? ? Assessment & Plan  ?  ? ? ?Problem List Items Addressed This Visit   ? ?  ? Cardiovascular and Mediastinum  ? Essential hypertension  ?  -BP elevated, BP repeated and improved. Stable. ?-Continue current medication regimen. ?-Will continue to monitor. ?  ?  ?  ? Respiratory  ? COPD mixed type ([place])  ?  -Followed by Pulmonology. ? ?  ?  ?  ? Endocrine  ? Uncontrolled type 2 diabetes mellitus with hyperglycemia (HCC) - Primary  ?  -A1c today has significantly improved from 9.6 to 5.8, will continue current medication regimen. See med list. Discussed monitoring carbohydrate and glucose intake. Recommend to continue ambulatory glucose monitoring. Will continue to monitor. ?  ?  ? Relevant Orders  ? POCT glycosylated hemoglobin (Hb A1C) (Completed)  ?  ? Other  ? Hyperlipidemia  ?  -Patient has repeatedly declined statin therapy. Managing with diet. Encourage weight loss as well (pt on GLP-1 therapy). Will continue to monitor. ?  ?  ? ? ?Return in about 4 months (around 01/01/2022) for DM, HTN, HLD.  ?   ? ? ? ?[name], PA-C  ?[facility] ?[phone] (phone) ?[phone] (fax) ? ?[facility] ?

## 2021-09-01 NOTE — Assessment & Plan Note (Signed)
-  BP elevated, BP repeated and improved. Stable. ?-Continue current medication regimen. ?-Will continue to monitor. ?

## 2021-09-01 NOTE — Patient Instructions (Signed)
Cholesterol Content in Foods ?Cholesterol is a waxy, fat-like substance that helps to carry fat in the blood. The body needs cholesterol in small amounts, but too much cholesterol can cause damage to the arteries and heart. ?What foods have cholesterol? ?Cholesterol is found in animal-based foods, such as meat, seafood, and dairy. Generally, low-fat dairy and lean meats have less cholesterol than full-fat dairy and fatty meats. The milligrams of cholesterol per serving (mg per serving) of common cholesterol-containing foods are listed below. ?Meats and other proteins ?Egg -- one large whole egg has 186 mg. ?Veal shank -- 4 oz (113 g) has 141 mg. ?Lean ground turkey (93% lean) -- 4 oz (113 g) has 118 mg. ?Fat-trimmed lamb loin -- 4 oz (113 g) has 106 mg. ?Lean ground beef (90% lean) -- 4 oz (113 g) has 100 mg. ?Lobster -- 3.5 oz (99 g) has 90 mg. ?Pork loin chops -- 4 oz (113 g) has 86 mg. ?Canned salmon -- 3.5 oz (99 g) has 83 mg. ?Fat-trimmed beef top loin -- 4 oz (113 g) has 78 mg. ?Frankfurter -- 1 frank (3.5 oz or 99 g) has 77 mg. ?Crab -- 3.5 oz (99 g) has 71 mg. ?Roasted chicken without skin, white meat -- 4 oz (113 g) has 66 mg. ?Light bologna -- 2 oz (57 g) has 45 mg. ?Deli-cut turkey -- 2 oz (57 g) has 31 mg. ?Canned tuna -- 3.5 oz (99 g) has 31 mg. ?Bacon -- 1 oz (28 g) has 29 mg. ?Oysters and mussels (raw) -- 3.5 oz (99 g) has 25 mg. ?Mackerel -- 1 oz (28 g) has 22 mg. ?Trout -- 1 oz (28 g) has 20 mg. ?Pork sausage -- 1 link (1 oz or 28 g) has 17 mg. ?Salmon -- 1 oz (28 g) has 16 mg. ?Tilapia -- 1 oz (28 g) has 14 mg. ?Dairy ?Soft-serve ice cream -- ? cup (4 oz or 86 g) has 103 mg. ?Whole-milk yogurt -- 1 cup (8 oz or 245 g) has 29 mg. ?Cheddar cheese -- 1 oz (28 g) has 28 mg. ?American cheese -- 1 oz (28 g) has 28 mg. ?Whole milk -- 1 cup (8 oz or 250 mL) has 23 mg. ?2% milk -- 1 cup (8 oz or 250 mL) has 18 mg. ?Cream cheese -- 1 tablespoon (Tbsp) (14.5 g) has 15 mg. ?Cottage cheese -- ? cup (4 oz or 113  g) has 14 mg. ?Low-fat (1%) milk -- 1 cup (8 oz or 250 mL) has 10 mg. ?Sour cream -- 1 Tbsp (12 g) has 8.5 mg. ?Low-fat yogurt -- 1 cup (8 oz or 245 g) has 8 mg. ?Nonfat Greek yogurt -- 1 cup (8 oz or 228 g) has 7 mg. ?Half-and-half cream -- 1 Tbsp (15 mL) has 5 mg. ?Fats and oils ?Cod liver oil -- 1 tablespoon (Tbsp) (13.6 g) has 82 mg. ?Butter -- 1 Tbsp (14 g) has 15 mg. ?Lard -- 1 Tbsp (12.8 g) has 14 mg. ?Bacon grease -- 1 Tbsp (12.9 g) has 14 mg. ?Mayonnaise -- 1 Tbsp (13.8 g) has 5-10 mg. ?Margarine -- 1 Tbsp (14 g) has 3-10 mg. ?The items listed above may not be a complete list of foods with cholesterol. Exact amounts of cholesterol in these foods may vary depending on specific ingredients and brands. Contact a dietitian for more information. ?What foods do not have cholesterol? ?Most plant-based foods do not have cholesterol unless you combine them with a food that has cholesterol.   Foods without cholesterol include: ?Grains and cereals. ?Vegetables. ?Fruits. ?Vegetable oils, such as olive, canola, and sunflower oil. ?Legumes, such as peas, beans, and lentils. ?Nuts and seeds. ?Egg whites. ?The items listed above may not be a complete list of foods that do not have cholesterol. Contact a dietitian for more information. ?Summary ?The body needs cholesterol in small amounts, but too much cholesterol can cause damage to the arteries and heart. ?Cholesterol is found in animal-based foods, such as meat, seafood, and dairy. Generally, low-fat dairy and lean meats have less cholesterol than full-fat dairy and fatty meats. ?This information is not intended to replace advice given to you by your health care provider. Make sure you discuss any questions you have with your health care provider. ?Document Revised: 10/18/2020 Document Reviewed: 10/18/2020 ?Elsevier Patient Education ? 2022 Elsevier Inc. ? ?

## 2021-09-06 ENCOUNTER — Other Ambulatory Visit: Payer: Self-pay | Admitting: Physician Assistant

## 2021-09-06 DIAGNOSIS — E1165 Type 2 diabetes mellitus with hyperglycemia: Secondary | ICD-10-CM

## 2021-09-23 ENCOUNTER — Other Ambulatory Visit: Payer: Self-pay | Admitting: Physician Assistant

## 2021-09-23 DIAGNOSIS — E785 Hyperlipidemia, unspecified: Secondary | ICD-10-CM

## 2021-10-02 ENCOUNTER — Telehealth: Payer: Self-pay | Admitting: Physician Assistant

## 2021-10-02 NOTE — Telephone Encounter (Signed)
Patient cannot get any ozempic from the pharmacy and she spoke with [place] yesterday regarding this-the rep was supposed to stop by today and leave samples however has not yet-patient is asking is there an alternative medication she can take? ?

## 2021-10-02 NOTE — Telephone Encounter (Signed)
Called pt she is advised of the sample of Qzempic that's here  in office for pick up ?

## 2021-10-15 ENCOUNTER — Other Ambulatory Visit: Payer: Self-pay | Admitting: Physician Assistant

## 2021-10-15 NOTE — Telephone Encounter (Signed)
Next Visit: 01/26/2022 ? ?Last Visit: 08/26/2021 ? ?Last Fill: 05/27/2021 ? ?DX: Seropositive rheumatoid arthritis of multiple sites  ? ?Current Dose per office note 08/26/2021: Arava 10 mg alternating with 20 mg every other day ? ?Labs: 08/26/2021 BBC is normal.  CMP is normal except glucose is elevated. ? ?Okay to refill Arava?  ?

## 2021-10-28 ENCOUNTER — Other Ambulatory Visit: Payer: Self-pay | Admitting: Physician Assistant

## 2021-10-28 DIAGNOSIS — E119 Type 2 diabetes mellitus without complications: Secondary | ICD-10-CM

## 2021-11-29 ENCOUNTER — Other Ambulatory Visit: Payer: Self-pay | Admitting: Physician Assistant

## 2021-12-03 ENCOUNTER — Other Ambulatory Visit: Payer: Self-pay | Admitting: Physician Assistant

## 2021-12-03 ENCOUNTER — Telehealth: Payer: Self-pay | Admitting: Physician Assistant

## 2021-12-03 DIAGNOSIS — I10 Essential (primary) hypertension: Secondary | ICD-10-CM

## 2021-12-03 DIAGNOSIS — E785 Hyperlipidemia, unspecified: Secondary | ICD-10-CM

## 2021-12-03 DIAGNOSIS — E1165 Type 2 diabetes mellitus with hyperglycemia: Secondary | ICD-10-CM

## 2021-12-03 DIAGNOSIS — Z0000 Encounter for general adult medical examination without abnormal findings: Secondary | ICD-10-CM

## 2021-12-03 NOTE — Telephone Encounter (Signed)
Patient is going to see Dr. [name] tomorrow and will be having labs done. She has a follow up visit first of July with you and she is asking if you would like to put in order for her to get whatever needs to be drawn so she doesn't have to give blood twice. Please advise.

## 2021-12-03 NOTE — Telephone Encounter (Signed)
Labs have been ordered per [facility]. AS, CMA

## 2021-12-08 ENCOUNTER — Other Ambulatory Visit: Payer: Self-pay | Admitting: *Deleted

## 2021-12-08 DIAGNOSIS — Z79899 Other long term (current) drug therapy: Secondary | ICD-10-CM | POA: Diagnosis not present

## 2021-12-08 DIAGNOSIS — M0579 Rheumatoid arthritis with rheumatoid factor of multiple sites without organ or systems involvement: Secondary | ICD-10-CM

## 2021-12-09 LAB — CBC WITH DIFFERENTIAL/PLATELET
Absolute Monocytes: 588 cells/uL (ref 200–950)
Basophils Absolute: 58 cells/uL (ref 0–200)
Basophils Relative: 1.1 %
Eosinophils Absolute: 239 cells/uL (ref 15–500)
Eosinophils Relative: 4.5 %
HCT: 44.5 % (ref 35.0–45.0)
Hemoglobin: 14.3 g/dL (ref 11.7–15.5)
Lymphs Abs: 1320 cells/uL (ref 850–3900)
MCH: 28.8 pg (ref 27.0–33.0)
MCHC: 32.1 g/dL (ref 32.0–36.0)
MCV: 89.7 fL (ref 80.0–100.0)
MPV: 10.2 fL (ref 7.5–12.5)
Monocytes Relative: 11.1 %
Neutro Abs: 3095 cells/uL (ref 1500–7800)
Neutrophils Relative %: 58.4 %
Platelets: 221 10*3/uL (ref 140–400)
RBC: 4.96 10*6/uL (ref 3.80–5.10)
RDW: 12.7 % (ref 11.0–15.0)
Total Lymphocyte: 24.9 %
WBC: 5.3 10*3/uL (ref 3.8–10.8)

## 2021-12-09 LAB — COMPLETE METABOLIC PANEL WITH GFR
AG Ratio: 1.6 (calc) (ref 1.0–2.5)
ALT: 14 U/L (ref 6–29)
AST: 15 U/L (ref 10–35)
Albumin: 4 g/dL (ref 3.6–5.1)
Alkaline phosphatase (APISO): 55 U/L (ref 37–153)
BUN: 19 mg/dL (ref 7–25)
CO2: 27 mmol/L (ref 20–32)
Calcium: 9.5 mg/dL (ref 8.6–10.4)
Chloride: 109 mmol/L (ref 98–110)
Creat: 0.92 mg/dL (ref 0.60–0.95)
Globulin: 2.5 g/dL (calc) (ref 1.9–3.7)
Glucose, Bld: 134 mg/dL — ABNORMAL HIGH (ref 65–99)
Potassium: 5 mmol/L (ref 3.5–5.3)
Sodium: 143 mmol/L (ref 135–146)
Total Bilirubin: 0.4 mg/dL (ref 0.2–1.2)
Total Protein: 6.5 g/dL (ref 6.1–8.1)
eGFR: 63 mL/min/{1.73_m2} (ref >=60)

## 2021-12-09 NOTE — Progress Notes (Signed)
CBC and CMP are normal.  Glucose is elevated.

## 2021-12-25 NOTE — Patient Instructions (Signed)

## 2021-12-28 ENCOUNTER — Other Ambulatory Visit: Payer: Self-pay | Admitting: Physician Assistant

## 2021-12-29 ENCOUNTER — Ambulatory Visit (INDEPENDENT_AMBULATORY_CARE_PROVIDER_SITE_OTHER): Payer: Medicare HMO | Admitting: Physician Assistant

## 2021-12-29 ENCOUNTER — Encounter: Payer: Self-pay | Admitting: Physician Assistant

## 2021-12-29 VITALS — BP 150/77 | HR 91 | Temp 97.7°F | Ht 66.0 in | Wt 237.0 lb

## 2021-12-29 DIAGNOSIS — J449 Chronic obstructive pulmonary disease, unspecified: Secondary | ICD-10-CM | POA: Diagnosis not present

## 2021-12-29 DIAGNOSIS — E1165 Type 2 diabetes mellitus with hyperglycemia: Secondary | ICD-10-CM | POA: Diagnosis not present

## 2021-12-29 DIAGNOSIS — I10 Essential (primary) hypertension: Secondary | ICD-10-CM | POA: Diagnosis not present

## 2021-12-29 DIAGNOSIS — E785 Hyperlipidemia, unspecified: Secondary | ICD-10-CM

## 2021-12-29 LAB — POCT GLYCOSYLATED HEMOGLOBIN (HGB A1C): Hemoglobin A1C: 5.8 % — AB (ref 4.0–5.6)

## 2021-12-29 MED ORDER — SEMAGLUTIDE (1 MG/DOSE) 4 MG/3ML ~~LOC~~ SOPN: 0.5000 mg | PEN_INJECTOR | SUBCUTANEOUS | 1 refills | Status: DC

## 2021-12-29 NOTE — Progress Notes (Signed)
Established patient visit   Patient: Tina Hansen   DOB: 01-18-1941   81 y.o. Female  MRN: [id number] Visit Date: 12/29/2021  Chief Complaint  Patient presents with   Follow-up   Subjective    HPI  Patient presents for chronic follow-up.  Diabetes mellitus: Pt denies increased urination or thirst from baseline. Pt reports medication compliance. No hypoglycemic events. Not checking glucose at home routinely.   HTN: Pt denies chest pain, palpitations, dizziness. Taking medication as directed without side effects. Reports checked her blood pressure yesterday before taking her medications which was around 148/90.  HLD: Pt managing with diet. Not interested on statin therapy.   COPD: Reports breathing has been better since being on Trelegy. Able to be more active without getting short of breath so easily.   Medications: Outpatient Medications Prior to Visit  Medication Sig   albuterol (PROVENTIL) (2.5 MG/3ML) 0.083% nebulizer solution USE ONE VIAL IN NEBULIZER EVERY 6 HOURS AS NEEDED   albuterol (VENTOLIN HFA) 108 (90 Base) MCG/ACT inhaler INHALE 1 TO 2 PUFFS BY MOUTH EVERY 6 HOURS AS NEEDED FOR WHEEZING AND FOR SHORTNESS OF BREATH   Ascorbic Acid (VITAMIN C PO) Take by mouth daily.   Biotin 5000 MCG CAPS Take 10,000 mcg by mouth daily.   calcium gluconate 500 MG tablet Take 500 mg by mouth daily.   Cholecalciferol (VITAMIN D3) 1000 units CAPS Take 1 capsule by mouth daily.    Chromium 1 MG CAPS Take 1 mg by mouth daily. Increase to 2 caps if needed   Coenzyme Q10 (COQ-10) 100 MG CAPS Take 1 capsule by mouth daily.   ECHINACEA PO Take by mouth daily.   fish oil-omega-3 fatty acids 1000 MG capsule Take 2 capsules by mouth daily.   Fluticasone-Umeclidin-Vilant (TRELEGY ELLIPTA) 100-62.5-25 MCG/ACT AEPB Inhale 1 puff into the lungs daily.   glipiZIDE (GLUCOTROL) 5 MG tablet TAKE 1/2 (ONE-HALF) TABLET BY MOUTH ONCE DAILY BEFORE SUPPER   leflunomide (ARAVA) 20 MG tablet TAKE 1/2  (ONE-HALF) TABLET BY MOUTH EVERY OTHER DAY ALTERNATING  WITH  ONE  TABLET  EVERY  OTHER  DAY   LUTEIN PO Take by mouth daily.   Methylsulfonylmethane (MSM PO) Take by mouth daily.   Milk Thistle 200 MG CAPS Take 400 mg by mouth daily.   Multiple Vitamin (MULTIVITAMIN) capsule Take 1 capsule by mouth daily.   NON FORMULARY Take 1 capsule by mouth daily. Collagen capsule   valsartan (DIOVAN) 160 MG tablet Take 1 tablet (160 mg total) by mouth daily.   vitamin E 180 MG (400 UNITS) capsule Take 400 Units by mouth daily.   [DISCONTINUED] OZEMPIC, 0.25 OR 0.5 MG/DOSE, 2 MG/1.5ML SOPN INJECT 0.25MG INTO THE SKIN WEEKLY FOR 4 WEEKS. THEN INJECT 0.5MG INTO THE SKIN WEEKLY   [DISCONTINUED] OZEMPIC, 0.25 OR 0.5 MG/DOSE, 2 MG/3ML SOPN INJECT 0.25MG INTO THE SKIN WEEKLY FOR 4 WEEKS, THEN INJECT 0.5MG WEEKLY   No facility-administered medications prior to visit.    Review of Systems Review of Systems:  A fourteen system review of systems was performed and found to be positive as per HPI.  Last CBC Lab Results  Component Value Date   WBC 5.3 12/08/2021   HGB 14.3 12/08/2021   HCT 44.5 12/08/2021   MCV 89.7 12/08/2021   MCH 28.8 12/08/2021   RDW 12.7 12/08/2021   PLT 221 15/52/0802   Last metabolic panel Lab Results  Component Value Date   GLUCOSE 134 (H) 12/08/2021   NA 143 12/08/2021  K 5.0 12/08/2021   CL 109 12/08/2021   CO2 27 12/08/2021   BUN 19 12/08/2021   CREATININE 0.92 12/08/2021   EGFR 63 12/08/2021   CALCIUM 9.5 12/08/2021   PROT 6.5 12/08/2021   ALBUMIN 3.8 05/01/2021   LABGLOB 2.5 05/01/2021   AGRATIO 1.5 05/01/2021   BILITOT 0.4 12/08/2021   ALKPHOS 69 05/01/2021   AST 15 12/08/2021   ALT 14 12/08/2021   Last lipids Lab Results  Component Value Date   CHOL 252 (H) 02/18/2021   HDL 65 02/18/2021   LDLCALC 160 (H) 02/18/2021   TRIG 148 02/18/2021   CHOLHDL 3.9 02/18/2021   Last hemoglobin A1c Lab Results  Component Value Date   HGBA1C 5.8 (A) 12/29/2021    Last thyroid functions Lab Results  Component Value Date   TSH 4.560 (H) 03/20/2019   T3TOTAL 149 03/20/2019       Objective    BP (!) 150/77   Pulse 91   Temp 97.7 F (36.5 C)   Ht '5\' 6"'  (1.676 m)   Wt 237 lb (107.5 kg)   SpO2 95%   BMI 38.25 kg/m  BP Readings from Last 3 Encounters:  12/29/21 (!) 150/77  09/01/21 134/82  08/26/21 (!) 173/82   Wt Readings from Last 3 Encounters:  12/29/21 237 lb (107.5 kg)  09/01/21 235 lb (106.6 kg)  08/26/21 235 lb (106.6 kg)    Physical Exam  General:  In no acute distress, cooperative, appropriate for stated age.  Neuro:  Alert and oriented,  extra-ocular muscles intact  HEENT:  Normocephalic, atraumatic, neck supple  Skin:  no gross rash, warm, pink. Cardiac:  RRR, S1 S2 Respiratory: CTA B/L  Vascular:  Ext warm, no cyanosis apprec.; cap RF less 2 sec. No pitting edema on exam. Psych:  No HI/SI, judgement and insight good, Euthymic mood. Full Affect.   Results for orders placed or performed in visit on 12/29/21  POCT glycosylated hemoglobin (Hb A1C)  Result Value Ref Range   Hemoglobin A1C 5.8 (A) 4.0 - 5.6 %   HbA1c POC (<> result, manual entry)     HbA1c, POC (prediabetic range)     HbA1c, POC (controlled diabetic range)      Assessment & Plan      Problem List Items Addressed This Visit       Cardiovascular and Mediastinum   Essential hypertension    -BP elevated in office likely secondary to patient not taking her BP medication (Valsartan 160 mg) yet. Advised to check BP at home ideally at least 2 hrs after taking her medication and notify the office if BP consistently >140/90. Last CMP, renal function and electrolytes normal. Will continue to monitor.        Respiratory   COPD mixed type ([place])    -Followed by Pulmonology.        Endocrine   Uncontrolled type 2 diabetes mellitus with hyperglycemia (HCC) - Primary    -A1c remains well controlled at 5.8. Will continue Glipizide 2.5 mg daily and Ozempic  0.5 mg once a week. Discussed with patient if A1c remains well controlled then recommend stopping Glipizide and continue with Ozempic 0.5 mg weekly. Pt verbalized understanding. Recommend daily ambulatory glucose monitoring. Will continue to monitor.      Relevant Medications   Semaglutide, 1 MG/DOSE, 4 MG/3ML SOPN   Other Relevant Orders   POCT glycosylated hemoglobin (Hb A1C) (Completed)     Other   Hyperlipidemia    -Discussed with  patient lipid panel order was placed to obtain with her rheumatology labs, lab was not collected. Placed a new future lab order to obtain when she gets her routine labs with rheumatology. Patient declines statin therapy due to side effect concerns. Recommend to follow a low fat diet. Will continue to monitor.      Relevant Orders   Lipid Profile    Return in about 4 months (around 05/01/2022) for DM, HTN, HLD.        [name], PA-C  [facility] [phone] (phone) [phone] (fax)  [place]

## 2021-12-29 NOTE — Assessment & Plan Note (Signed)
-  A1c remains well controlled at 5.8. Will continue Glipizide 2.5 mg daily and Ozempic 0.5 mg once a week. Discussed with patient if A1c remains well controlled then recommend stopping Glipizide and continue with Ozempic 0.5 mg weekly. Pt verbalized understanding. Recommend daily ambulatory glucose monitoring. Will continue to monitor.

## 2021-12-29 NOTE — Assessment & Plan Note (Signed)
-  Followed by Pulmonology.

## 2021-12-29 NOTE — Assessment & Plan Note (Signed)
>>  ASSESSMENT AND PLAN FOR ESSENTIAL HYPERTENSION WRITTEN ON 12/29/2021  2:37 PM BY ABONZA, [name], PA-C  -BP elevated in office likely secondary to patient not taking her BP medication (Valsartan 160 mg) yet. Advised to check BP at home ideally at least 2 hrs after taking her medication and notify the office if BP consistently >140/90. Last CMP, renal function and electrolytes normal. Will continue to monitor.

## 2021-12-29 NOTE — Assessment & Plan Note (Signed)
-  BP elevated in office likely secondary to patient not taking her BP medication (Valsartan 160 mg) yet. Advised to check BP at home ideally at least 2 hrs after taking her medication and notify the office if BP consistently >140/90. Last CMP, renal function and electrolytes normal. Will continue to monitor.

## 2021-12-29 NOTE — Assessment & Plan Note (Signed)
-  Discussed with patient lipid panel order was placed to obtain with her rheumatology labs, lab was not collected. Placed a new future lab order to obtain when she gets her routine labs with rheumatology. Patient declines statin therapy due to side effect concerns. Recommend to follow a low fat diet. Will continue to monitor.

## 2022-01-12 NOTE — Progress Notes (Deleted)
Office Visit Note  Patient: Tina Hansen             Date of Birth: October 17, 1940           MRN: [id number]             PCP: [name], PA-C Referring: [name], PA-C Visit Date: 01/26/2022 Occupation: '@GUAROCC'$ @  Subjective:  No chief complaint on file.   History of Present Illness: Tina Hansen is a 81 y.o. female ***   Activities of Daily Living:  Patient reports morning stiffness for *** {minute/hour:19697}.   Patient {ACTIONS;DENIES/REPORTS:21021675::"Denies"} nocturnal pain.  Difficulty dressing/grooming: {ACTIONS;DENIES/REPORTS:21021675::"Denies"} Difficulty climbing stairs: {ACTIONS;DENIES/REPORTS:21021675::"Denies"} Difficulty getting out of chair: {ACTIONS;DENIES/REPORTS:21021675::"Denies"} Difficulty using hands for taps, buttons, cutlery, and/or writing: {ACTIONS;DENIES/REPORTS:21021675::"Denies"}  No Rheumatology ROS completed.   PMFS History:  Patient Active Problem List   Diagnosis Date Noted   Uncontrolled type 2 diabetes mellitus with hyperglycemia ([place]) 09/01/2021   Pain 09/25/2019   Fatigue 09/25/2019   Statin declined 08/21/2019   Non compliance with medical treatment 08/21/2019   Cardiovascular disease 03/24/2019   Elevated LDL cholesterol level 11/07/2018   Pre-diabetes 11/07/2018   BMI 40.0-44.9, adult (Tina Hansen) 08/08/2018   Onychomycosis 04/06/2018   Decreased GFR 01/05/2018   Healthcare maintenance 09/08/2017   Lipoma of left upper extremity 08/09/2017   COPD mixed type (Tina Hansen) 06/28/2017   History of COPD 06/26/2016   History of hypertension 06/26/2016   Osteopenia of multiple sites 06/26/2016   Morbid obesity (Tina Hansen) 05/04/2016   High risk medication use 04/25/2016   Vocal cord dysfunction 04/29/2015   Seropositive rheumatoid arthritis of multiple sites (Tina Hansen) 04/29/2015   Hyperlipidemia 10/06/2007   Essential hypertension 10/06/2007   OTHER DISEASES OF VOCAL CORDS 10/06/2007   Dyspnea on exertion 10/06/2007   CERVICAL  CANCER, HX OF 10/06/2007    Past Medical History:  Diagnosis Date   Asthma    Cervical cancer (Tina Hansen)    Diabetes (Tina Hansen)    Emphysema lung (Tina Hansen)    Hyperlipidemia    Hypertension    Rheumatoid arthritis(714.0)    Tina Hansen    Family History  Problem Relation Age of Onset   Asthma Mother    Breast cancer Mother    Cancer Mother    Alcohol abuse Father    Diabetes Son    Cancer Maternal Uncle    Heart attack Maternal Uncle    Past Surgical History:  Procedure Laterality Date   ABDOMINAL HYSTERECTOMY     BREAST SURGERY     Breat implants     LIPOMA EXCISION Left    shoulder    TUBAL LIGATION     TUMOR REMOVAL Right    FOREARM   Social History   Social History Narrative   Not on file   Immunization History  Administered Date(s) Administered   Pneumococcal Conjugate-13 04/06/2018   Pneumococcal Polysaccharide-23 06/27/2007, 03/22/2009   Zoster Recombinat (Shingrix) 05/11/2019   Zoster, Live 05/11/2019     Objective: Vital Signs: There were no vitals taken for this visit.   Physical Exam   Musculoskeletal Exam: ***  CDAI Exam: CDAI Score: -- Patient Global: --; Provider Global: -- Swollen: --; Tender: -- Joint Exam 01/26/2022   No joint exam has been documented for this visit   There is currently no information documented on the homunculus. Go to the Rheumatology activity and complete the homunculus joint exam.  Investigation: No additional findings.  Imaging: No results found.  Recent Labs: Lab Results  Component Value Date  WBC 5.3 12/08/2021   HGB 14.3 12/08/2021   PLT 221 12/08/2021   NA 143 12/08/2021   K 5.0 12/08/2021   CL 109 12/08/2021   CO2 27 12/08/2021   GLUCOSE 134 (H) 12/08/2021   BUN 19 12/08/2021   CREATININE 0.92 12/08/2021   BILITOT 0.4 12/08/2021   ALKPHOS 69 05/01/2021   AST 15 12/08/2021   ALT 14 12/08/2021   PROT 6.5 12/08/2021   ALBUMIN 3.8 05/01/2021   CALCIUM 9.5 12/08/2021   GFRAA 76 10/22/2020     Speciality Comments: Methotrexate discontinued due to elevated creatinine.  Arava start date February 06, 2020  Procedures:  No procedures performed Allergies: Other and Theophylline   Assessment / Plan:     Visit Diagnoses: No diagnosis found.  Orders: No orders of the defined types were placed in this encounter.  No orders of the defined types were placed in this encounter.   Face-to-face time spent with patient was *** minutes. Greater than 50% of time was spent in counseling and coordination of care.  Follow-Up Instructions: No follow-ups on file.   Earnestine Mealing, CMA  Note - This record has been created using Editor, commissioning.  Chart creation errors have been sought, but may not always  have been located. Such creation errors do not reflect on  the standard of medical care.

## 2022-01-15 ENCOUNTER — Ambulatory Visit (INDEPENDENT_AMBULATORY_CARE_PROVIDER_SITE_OTHER): Payer: Medicare HMO | Admitting: Physician Assistant

## 2022-01-15 ENCOUNTER — Encounter: Payer: Self-pay | Admitting: Physician Assistant

## 2022-01-15 VITALS — BP 139/98 | HR 93 | Resp 12 | Ht 66.5 in | Wt 239.2 lb

## 2022-01-15 DIAGNOSIS — M8589 Other specified disorders of bone density and structure, multiple sites: Secondary | ICD-10-CM | POA: Diagnosis not present

## 2022-01-15 DIAGNOSIS — E559 Vitamin D deficiency, unspecified: Secondary | ICD-10-CM

## 2022-01-15 DIAGNOSIS — Z79899 Other long term (current) drug therapy: Secondary | ICD-10-CM | POA: Diagnosis not present

## 2022-01-15 DIAGNOSIS — M0579 Rheumatoid arthritis with rheumatoid factor of multiple sites without organ or systems involvement: Secondary | ICD-10-CM | POA: Diagnosis not present

## 2022-01-15 DIAGNOSIS — M7061 Trochanteric bursitis, right hip: Secondary | ICD-10-CM | POA: Diagnosis not present

## 2022-01-15 DIAGNOSIS — R42 Dizziness and giddiness: Secondary | ICD-10-CM

## 2022-01-15 DIAGNOSIS — Z8639 Personal history of other endocrine, nutritional and metabolic disease: Secondary | ICD-10-CM

## 2022-01-15 DIAGNOSIS — M19041 Primary osteoarthritis, right hand: Secondary | ICD-10-CM

## 2022-01-15 DIAGNOSIS — M19042 Primary osteoarthritis, left hand: Secondary | ICD-10-CM

## 2022-01-15 DIAGNOSIS — G629 Polyneuropathy, unspecified: Secondary | ICD-10-CM

## 2022-01-15 DIAGNOSIS — Z8679 Personal history of other diseases of the circulatory system: Secondary | ICD-10-CM | POA: Diagnosis not present

## 2022-01-15 DIAGNOSIS — Z9181 History of falling: Secondary | ICD-10-CM | POA: Diagnosis not present

## 2022-01-15 DIAGNOSIS — Z8709 Personal history of other diseases of the respiratory system: Secondary | ICD-10-CM

## 2022-01-15 DIAGNOSIS — J383 Other diseases of vocal cords: Secondary | ICD-10-CM | POA: Diagnosis not present

## 2022-01-15 NOTE — Patient Instructions (Signed)
Standing Labs We placed an order today for your standing lab work.   Please have your standing labs drawn in September and every 3 months   If possible, please have your labs drawn 2 weeks prior to your appointment so that the provider can discuss your results at your appointment.  Please note that you may see your imaging and lab results in MyChart before we have reviewed them. We may be awaiting multiple results to interpret others before contacting you. Please allow our office up to 72 hours to thoroughly review all of the results before contacting the office for clarification of your results.  We have open lab daily: Monday through Thursday from 1:30-4:30 PM and Friday from 1:30-4:00 PM at the office of Dr. Shaili [name], [facility].   Please be advised, all patients with office appointments requiring lab work will take precedent over walk-in lab work.  If possible, please come for your lab work on Monday and Friday afternoons, as you may experience shorter wait times. The office is located at [address], [address], [place], [place] [address] No appointment is necessary.   Labs are drawn by [facility]. Please bring your co-pay at the time of your lab draw.  You may receive a bill from [facility] for your lab work.  Please note if you are on Hydroxychloroquine and and an order has been placed for a Hydroxychloroquine level, you will need to have it drawn 4 hours or more after your last dose.  If you wish to have your labs drawn at another location, please call the office 24 hours in advance to send orders.  If you have any questions regarding directions or hours of operation,  please call [phone].   As a reminder, please drink plenty of water prior to coming for your lab work. Thanks!  

## 2022-01-15 NOTE — Progress Notes (Signed)
Office Visit Note  Patient: Tina Hansen             Date of Birth: 21-Mar-1941           MRN: [id number]             PCP: [name], PA-C Referring: [name], PA-C Visit Date: 01/15/2022 Occupation: '@GUAROCC'$ @  Subjective:  Medication monitoring   History of Present Illness: Tina Hansen is a 81 y.o. female with history of seropositive rheumatoid arthritis, osteoarthritis, and osteopenia. She is taking arava 10 mg alternating with 20 mg every other day.  She continues to tolerate arava without any side effects.  She has not missed any doses of Arava recently.  She denies any signs or symptoms of a rheumatoid arthritis flare.  She is not experiencing any morning stiffness or nocturnal pain.  She has not had any difficulty with ADLs.  She states yesterday she was carrying some groceries and is having some increased soreness in her right wrist which has been very mild.    Activities of Daily Living:  Patient reports morning stiffness for 0 minutes.   Patient Denies nocturnal pain.  Difficulty dressing/grooming: Denies Difficulty climbing stairs: Reports Difficulty getting out of chair: Denies Difficulty using hands for taps, buttons, cutlery, and/or writing: Denies  Review of Systems  Constitutional:  Negative for fatigue.  HENT:  Negative for mouth sores, mouth dryness and nose dryness.   Eyes:  Negative for pain, visual disturbance and dryness.  Respiratory:  Negative for cough, hemoptysis and difficulty breathing.   Cardiovascular:  Negative for chest pain, palpitations, hypertension and swelling in legs/feet.  Gastrointestinal:  Negative for blood in stool, constipation and diarrhea.  Endocrine: Negative for increased urination.  Genitourinary:  Negative for painful urination and involuntary urination.  Musculoskeletal:  Negative for joint pain, joint pain, joint swelling, myalgias, muscle weakness, morning stiffness, muscle tenderness and myalgias.  Skin:   Negative for color change, pallor, rash, hair loss, nodules/bumps, skin tightness, ulcers and sensitivity to sunlight.  Allergic/Immunologic: Negative for susceptible to infections.  Neurological:  Positive for dizziness. Negative for numbness, headaches and weakness.  Hematological:  Negative for swollen glands.  Psychiatric/Behavioral:  Positive for sleep disturbance. Negative for depressed mood. The patient is not nervous/anxious.     PMFS History:  Patient Active Problem List   Diagnosis Date Noted   Uncontrolled type 2 diabetes mellitus with hyperglycemia ([place]) 09/01/2021   Pain 09/25/2019   Fatigue 09/25/2019   Statin declined 08/21/2019   Non compliance with medical treatment 08/21/2019   Cardiovascular disease 03/24/2019   Elevated LDL cholesterol level 11/07/2018   Pre-diabetes 11/07/2018   BMI 40.0-44.9, adult ([place]) 08/08/2018   Onychomycosis 04/06/2018   Decreased GFR 01/05/2018   Healthcare maintenance 09/08/2017   Lipoma of left upper extremity 08/09/2017   COPD mixed type ([place]) 06/28/2017   History of COPD 06/26/2016   History of hypertension 06/26/2016   Osteopenia of multiple sites 06/26/2016   Morbid obesity ([place]) 05/04/2016   High risk medication use 04/25/2016   Vocal cord dysfunction 04/29/2015   Seropositive rheumatoid arthritis of multiple sites ([place]) 04/29/2015   Hyperlipidemia 10/06/2007   Essential hypertension 10/06/2007   OTHER DISEASES OF VOCAL CORDS 10/06/2007   Dyspnea on exertion 10/06/2007   CERVICAL CANCER, HX OF 10/06/2007    Past Medical History:  Diagnosis Date   Asthma    Cervical cancer ([place])    Diabetes ([place])    Emphysema lung ([place])  Hyperlipidemia    Hypertension    Rheumatoid arthritis(714.0)    [name]    Family History  Problem Relation Age of Onset   Asthma Mother    Breast cancer Mother    Cancer Mother    Alcohol abuse Father    Diabetes Son    Cancer Maternal Uncle    Heart attack Maternal Uncle    Past  Surgical History:  Procedure Laterality Date   ABDOMINAL HYSTERECTOMY     BREAST SURGERY     Breat implants     LIPOMA EXCISION Left    shoulder    TUBAL LIGATION     TUMOR REMOVAL Right    FOREARM   Social History   Social History Narrative   Not on file   Immunization History  Administered Date(s) Administered   Pneumococcal Conjugate-13 04/06/2018   Pneumococcal Polysaccharide-23 06/27/2007, 03/22/2009   Zoster Recombinat (Shingrix) 05/11/2019   Zoster, Live 05/11/2019     Objective: Vital Signs: BP (!) 139/98 (BP Location: Left Arm, Patient Position: Sitting, Cuff Size: Small)   Pulse 93   Resp 12   Ht 5' 6.5" (1.689 m)   Wt 239 lb 3.2 oz (108.5 kg)   BMI 38.03 kg/m    Physical Exam Vitals and nursing note reviewed.  Constitutional:      Appearance: She is well-developed.  HENT:     Head: Normocephalic and atraumatic.  Eyes:     Conjunctiva/sclera: Conjunctivae normal.  Cardiovascular:     Rate and Rhythm: Normal rate and regular rhythm.     Heart sounds: Normal heart sounds.  Pulmonary:     Effort: Pulmonary effort is normal.     Breath sounds: Normal breath sounds.  Abdominal:     General: Bowel sounds are normal.     Palpations: Abdomen is soft.  Musculoskeletal:     Cervical back: Normal range of motion.  Skin:    General: Skin is warm and dry.     Capillary Refill: Capillary refill takes less than 2 seconds.  Neurological:     Mental Status: She is alert and oriented to person, place, and time.  Psychiatric:        Behavior: Behavior normal.      Musculoskeletal Exam: C-spine has good ROM.  Postural thoracic kyphosis.  Shoulder joints, elbow joints, wrist joints, MCPs, PIPs, and DIPs good ROM with no synovitis. PIP and DIP thickening consistent with osteoarthritis of both hands.  CMC joint prominence.  Hip joints have good ROM with no groin pain.  Knee joints have good ROM with no warmth or effusion.  Ankle joints have good ROM with no tenderness  or synovitis. No tenderness over MTPs.   CDAI Exam: CDAI Score: 0.2  Patient Global: 1 mm; Provider Global: 1 mm Swollen: 0 ; Tender: 0  Joint Exam 01/15/2022   No joint exam has been documented for this visit   There is currently no information documented on the homunculus. Go to the Rheumatology activity and complete the homunculus joint exam.  Investigation: No additional findings.  Imaging: No results found.  Recent Labs: Lab Results  Component Value Date   WBC 5.3 12/08/2021   HGB 14.3 12/08/2021   PLT 221 12/08/2021   NA 143 12/08/2021   K 5.0 12/08/2021   CL 109 12/08/2021   CO2 27 12/08/2021   GLUCOSE 134 (H) 12/08/2021   BUN 19 12/08/2021   CREATININE 0.92 12/08/2021   BILITOT 0.4 12/08/2021   ALKPHOS 69 05/01/2021  AST 15 12/08/2021   ALT 14 12/08/2021   PROT 6.5 12/08/2021   ALBUMIN 3.8 05/01/2021   CALCIUM 9.5 12/08/2021   GFRAA 76 10/22/2020    Speciality Comments: Methotrexate discontinued due to elevated creatinine.  Arava start date February 06, 2020  Procedures:  No procedures performed Allergies: Other and Theophylline   Assessment / Plan:     Visit Diagnoses: Seropositive rheumatoid arthritis of multiple sites ([place]) - +RF, +CCP, h/o elevated sed rate: She has no joint tenderness or synovitis on examination today.  She has not had any signs or symptoms of a rheumatoid arthritis flare.  She has clinically been doing well on Arava 10 mg alternating with 20 mg every other day.  She continues to tolerate Arava without any side effects and has not missed any doses recently.  She has not been experiencing any morning stiffness, nocturnal pain, or difficulty with ADLs.  She will remain on [place] as prescribed.  She was advised to notify us if she develops increased joint pain or joint swelling.  She will follow-up in the office in 5 months or sooner if needed.  High risk medication use - Arava 10 mg alternating with 20 mg every other day.  CBC and CMP  updated on 12/08/21. She will be due to update lab work in September and every 3 months.  She has not had any recent or recurrent infections.  Discussed the importance of holding arava if she develops signs or symptoms of an infection and to resume once the infection has completely cleared.   Primary osteoarthritis of both hands: She has PIP and DIP thickening consistent with osteoarthritis of both hands.  CMC joint prominence noted bilaterally.  Complete fist formation noted.  Discussed the importance of joint protection and muscle strengthening.  She is not experiencing any increased joint pain or stiffness at this time.  Trochanteric bursitis, right hip - Resolved.  Osteopenia of multiple sites - DEXA 05/06/2021: The BMD measured at Forearm Radius 33% is 0.739 g/cm2 with a T-1.7 scoreof -1.7.  She is taking calcium and vitamin D supplement daily.  She has not had any recent falls or fractures.  She will be due to update a bone density in November 2024.  Vitamin D deficiency: She is taking calcium vitamin D supplement daily.  Other medical conditions are listed as follows:  History of hypertension   History of diabetes mellitus  History of hyperlipidemia  Neuropathy - she complains of numbness in her bilateral fifth toes.  She believes is related to the fall in December.   Vertigo  Vocal cord dysfunction - related to prior medication use per patient.  History of recent fall  History of COPD  Orders: No orders of the defined types were placed in this encounter.  No orders of the defined types were placed in this encounter.    Follow-Up Instructions: Return in about 5 months (around 06/17/2022) for Rheumatoid arthritis, Osteoarthritis.   Ofilia Neas, PA-C  Note - This record has been created using Dragon software.  Chart creation errors have been sought, but may not always  have been located. Such creation errors do not reflect on  the standard of medical care.

## 2022-01-22 ENCOUNTER — Other Ambulatory Visit: Payer: Self-pay | Admitting: Physician Assistant

## 2022-01-22 DIAGNOSIS — E119 Type 2 diabetes mellitus without complications: Secondary | ICD-10-CM

## 2022-01-25 ENCOUNTER — Other Ambulatory Visit: Payer: Self-pay | Admitting: Physician Assistant

## 2022-01-26 ENCOUNTER — Ambulatory Visit: Payer: Medicare HMO | Admitting: Physician Assistant

## 2022-01-26 DIAGNOSIS — Z8679 Personal history of other diseases of the circulatory system: Secondary | ICD-10-CM

## 2022-01-26 DIAGNOSIS — G629 Polyneuropathy, unspecified: Secondary | ICD-10-CM

## 2022-01-26 DIAGNOSIS — M0579 Rheumatoid arthritis with rheumatoid factor of multiple sites without organ or systems involvement: Secondary | ICD-10-CM

## 2022-01-26 DIAGNOSIS — R42 Dizziness and giddiness: Secondary | ICD-10-CM

## 2022-01-26 DIAGNOSIS — Z79899 Other long term (current) drug therapy: Secondary | ICD-10-CM

## 2022-01-26 DIAGNOSIS — E559 Vitamin D deficiency, unspecified: Secondary | ICD-10-CM

## 2022-01-26 DIAGNOSIS — M7061 Trochanteric bursitis, right hip: Secondary | ICD-10-CM

## 2022-01-26 DIAGNOSIS — Z9181 History of falling: Secondary | ICD-10-CM

## 2022-01-26 DIAGNOSIS — Z8709 Personal history of other diseases of the respiratory system: Secondary | ICD-10-CM

## 2022-01-26 DIAGNOSIS — M19041 Primary osteoarthritis, right hand: Secondary | ICD-10-CM

## 2022-01-26 DIAGNOSIS — J383 Other diseases of vocal cords: Secondary | ICD-10-CM

## 2022-01-26 DIAGNOSIS — Z8639 Personal history of other endocrine, nutritional and metabolic disease: Secondary | ICD-10-CM

## 2022-01-26 DIAGNOSIS — M8589 Other specified disorders of bone density and structure, multiple sites: Secondary | ICD-10-CM

## 2022-02-10 ENCOUNTER — Other Ambulatory Visit: Payer: Self-pay | Admitting: Physician Assistant

## 2022-02-10 NOTE — Telephone Encounter (Signed)
Next Visit: 06/01/2022  Last Visit: 01/15/2022  Last Fill: 10/15/2021  DX: Seropositive rheumatoid arthritis of multiple sites   Current Dose per office note 01/15/2022: Arava 10 mg alternating with 20 mg every other day.   Labs: 12/08/2021 CBC and CMP are normal.  Glucose is elevated.  Okay to refill Arava?

## 2022-03-25 ENCOUNTER — Other Ambulatory Visit: Payer: Self-pay | Admitting: *Deleted

## 2022-03-25 ENCOUNTER — Other Ambulatory Visit: Payer: Self-pay

## 2022-03-25 DIAGNOSIS — Z79899 Other long term (current) drug therapy: Secondary | ICD-10-CM

## 2022-03-25 DIAGNOSIS — Z8639 Personal history of other endocrine, nutritional and metabolic disease: Secondary | ICD-10-CM

## 2022-03-25 DIAGNOSIS — M0579 Rheumatoid arthritis with rheumatoid factor of multiple sites without organ or systems involvement: Secondary | ICD-10-CM | POA: Diagnosis not present

## 2022-03-26 LAB — CBC WITH DIFFERENTIAL/PLATELET
Absolute Monocytes: 620 cells/uL (ref 200–950)
Basophils Absolute: 62 cells/uL (ref 0–200)
Basophils Relative: 1 %
Eosinophils Absolute: 316 cells/uL (ref 15–500)
Eosinophils Relative: 5.1 %
HCT: 43.1 % (ref 35.0–45.0)
Hemoglobin: 14.3 g/dL (ref 11.7–15.5)
Lymphs Abs: 1376 cells/uL (ref 850–3900)
MCH: 29.2 pg (ref 27.0–33.0)
MCHC: 33.2 g/dL (ref 32.0–36.0)
MCV: 88 fL (ref 80.0–100.0)
MPV: 10.1 fL (ref 7.5–12.5)
Monocytes Relative: 10 %
Neutro Abs: 3825 cells/uL (ref 1500–7800)
Neutrophils Relative %: 61.7 %
Platelets: 226 10*3/uL (ref 140–400)
RBC: 4.9 10*6/uL (ref 3.80–5.10)
RDW: 12.7 % (ref 11.0–15.0)
Total Lymphocyte: 22.2 %
WBC: 6.2 10*3/uL (ref 3.8–10.8)

## 2022-03-26 LAB — COMPLETE METABOLIC PANEL WITH GFR
AG Ratio: 1.7 (calc) (ref 1.0–2.5)
ALT: 22 U/L (ref 6–29)
AST: 21 U/L (ref 10–35)
Albumin: 4.1 g/dL (ref 3.6–5.1)
Alkaline phosphatase (APISO): 61 U/L (ref 37–153)
BUN/Creatinine Ratio: 23 (calc) — ABNORMAL HIGH (ref 6–22)
BUN: 24 mg/dL (ref 7–25)
CO2: 27 mmol/L (ref 20–32)
Calcium: 9.5 mg/dL (ref 8.6–10.4)
Chloride: 105 mmol/L (ref 98–110)
Creat: 1.04 mg/dL — ABNORMAL HIGH (ref 0.60–0.95)
Globulin: 2.4 g/dL (calc) (ref 1.9–3.7)
Glucose, Bld: 167 mg/dL — ABNORMAL HIGH (ref 65–99)
Potassium: 5.2 mmol/L (ref 3.5–5.3)
Sodium: 141 mmol/L (ref 135–146)
Total Bilirubin: 0.4 mg/dL (ref 0.2–1.2)
Total Protein: 6.5 g/dL (ref 6.1–8.1)
eGFR: 54 mL/min/{1.73_m2} — ABNORMAL LOW (ref >=60)

## 2022-03-26 LAB — LIPID PANEL
Cholesterol: 234 mg/dL — ABNORMAL HIGH (ref <200)
HDL: 69 mg/dL (ref >=50)
LDL Cholesterol (Calc): 139 mg/dL (calc) — ABNORMAL HIGH
Non-HDL Cholesterol (Calc): 165 mg/dL (calc) — ABNORMAL HIGH (ref <130)
Total CHOL/HDL Ratio: 3.4 (calc) (ref <5.0)
Triglycerides: 140 mg/dL (ref <150)

## 2022-03-26 NOTE — Progress Notes (Signed)
Glucose is elevated.  Creatinine is mildly elevated.  LDL is elevated but improved.  CBC is normal.

## 2022-05-04 ENCOUNTER — Other Ambulatory Visit (INDEPENDENT_AMBULATORY_CARE_PROVIDER_SITE_OTHER): Payer: Medicare HMO

## 2022-05-04 ENCOUNTER — Ambulatory Visit (INDEPENDENT_AMBULATORY_CARE_PROVIDER_SITE_OTHER): Payer: Medicare HMO | Admitting: Physician Assistant

## 2022-05-04 ENCOUNTER — Other Ambulatory Visit: Payer: Self-pay | Admitting: Physician Assistant

## 2022-05-04 ENCOUNTER — Encounter: Payer: Self-pay | Admitting: Physician Assistant

## 2022-05-04 VITALS — BP 138/72 | HR 101 | Resp 18 | Ht 66.0 in | Wt 244.0 lb

## 2022-05-04 DIAGNOSIS — I10 Essential (primary) hypertension: Secondary | ICD-10-CM | POA: Diagnosis not present

## 2022-05-04 DIAGNOSIS — E1165 Type 2 diabetes mellitus with hyperglycemia: Secondary | ICD-10-CM

## 2022-05-04 DIAGNOSIS — E785 Hyperlipidemia, unspecified: Secondary | ICD-10-CM

## 2022-05-04 DIAGNOSIS — R600 Localized edema: Secondary | ICD-10-CM | POA: Diagnosis not present

## 2022-05-04 DIAGNOSIS — R32 Unspecified urinary incontinence: Secondary | ICD-10-CM | POA: Diagnosis not present

## 2022-05-04 LAB — POCT GLYCOSYLATED HEMOGLOBIN (HGB A1C): HbA1c POC (<> result, manual entry): 6.1 % (ref 4.0–5.6)

## 2022-05-04 MED ORDER — SEMAGLUTIDE (1 MG/DOSE) 4 MG/3ML ~~LOC~~ SOPN: 0.5000 mg | PEN_INJECTOR | SUBCUTANEOUS | 2 refills | Status: DC

## 2022-05-04 MED ORDER — VALSARTAN 160 MG PO TABS: 160.0000 mg | ORAL_TABLET | Freq: Every day | ORAL | 1 refills | Status: DC

## 2022-05-04 NOTE — Assessment & Plan Note (Signed)
-  Stable, goal <140/90. Continue Valsartan 160 mg daily. Discussed CMP from October 2023 and ensuring adequate hydration. Patient has routine labs checked with rheumatology.

## 2022-05-04 NOTE — Assessment & Plan Note (Signed)
-  A1c today has increased from 5.8 to 6.1, discussed with patient will resend rx for Ozempic 0.5 mg once a week. Due to side effects with glipizide including weight gain recommend stopping medication and maintaining on GLP-1 agonist therapy such as Ozempic which also has cardiovascular benefits. Advised to notify the office if unable to get Ozempic. Recommend to continue ambulatory glucose monitoring and low carbohydrate/glucose diet. Recommend obtaining urine microalbumin at follow-up visit.

## 2022-05-04 NOTE — Assessment & Plan Note (Signed)
>>  ASSESSMENT AND PLAN FOR ESSENTIAL HYPERTENSION WRITTEN ON 05/04/2022  4:52 PM BY ABONZA, [name], PA-C  -Stable, goal <140/90. Continue Valsartan 160 mg daily. Discussed CMP from October 2023 and ensuring adequate hydration. Patient has routine labs checked with rheumatology.

## 2022-05-04 NOTE — Assessment & Plan Note (Signed)
-  Discussed lipid panel from October 2023 which mildly improved from prior. Pt has declined medication therapy repeatedly due to side effect concerns. Recommend to continue to work on diet and lifestyle changes including heart healthy diet.

## 2022-05-04 NOTE — Patient Instructions (Signed)

## 2022-05-04 NOTE — Progress Notes (Signed)
Established patient visit   Patient: Tina Hansen   DOB: 1941-03-07   81 y.o. Female  MRN: [id number] Visit Date: 05/04/2022  Chief Complaint  Patient presents with   Follow-up   Diabetes   Hypertension   Subjective    HPI  Patient presents for chronic follow-up visit.  Diabetes: No increased thirst. Does report having increased urinary frequency and at times leakage. Pt reports medication compliance with glipizide, states unable to get Ozempic and unsure why. No hypoglycemic events. Checking glucose at home. FBS range from 130-150s. Patient reports has been careful about her diet and eating smaller portions.   HTN: No chest pain, palpitations, dizziness, orthopnea or syncope. Reports lower extremity swelling fluctuates. Taking medication as directed without side effects. No increased intake with sodium intake.  HLD: Pt trying to manage with diet.    Medications: Outpatient Medications Prior to Visit  Medication Sig   albuterol (PROVENTIL) (2.5 MG/3ML) 0.083% nebulizer solution USE ONE VIAL IN NEBULIZER EVERY 6 HOURS AS NEEDED   albuterol (VENTOLIN HFA) 108 (90 Base) MCG/ACT inhaler INHALE 1 TO 2 PUFFS BY MOUTH EVERY 6 HOURS AS NEEDED FOR WHEEZING AND FOR SHORTNESS OF BREATH   Ascorbic Acid (VITAMIN C PO) Take by mouth daily.   Biotin 5000 MCG CAPS Take 10,000 mcg by mouth daily.   calcium gluconate 500 MG tablet Take 500 mg by mouth daily.   Cholecalciferol (VITAMIN D3) 1000 units CAPS Take 1 capsule by mouth daily.    Chromium 1 MG CAPS Take 1 mg by mouth daily. Increase to 2 caps if needed   Coenzyme Q10 (COQ-10) 100 MG CAPS Take 1 capsule by mouth daily.   ECHINACEA PO Take by mouth daily.   fish oil-omega-3 fatty acids 1000 MG capsule Take 2 capsules by mouth daily.   Fluticasone-Umeclidin-Vilant (TRELEGY ELLIPTA) 100-62.5-25 MCG/ACT AEPB Inhale 1 puff into the lungs daily.   glipiZIDE (GLUCOTROL) 5 MG tablet TAKE 1/2 (ONE-HALF) TABLET BY MOUTH ONCE DAILY BEFORE  SUPPER   leflunomide (ARAVA) 20 MG tablet TAKE 1/2 TABLET BY MOUTH EVERY OTHER DAY ALTERNATING WITH 1 TABLET EVERY OTHER DAY   LUTEIN PO Take by mouth daily.   Methylsulfonylmethane (MSM PO) Take by mouth daily.   Milk Thistle 200 MG CAPS Take 400 mg by mouth daily.   Multiple Vitamin (MULTIVITAMIN) capsule Take 1 capsule by mouth daily.   NON FORMULARY Take 1 capsule by mouth daily. Collagen capsule   vitamin E 180 MG (400 UNITS) capsule Take 400 Units by mouth daily.   [DISCONTINUED] OZEMPIC, 0.25 OR 0.5 MG/DOSE, 2 MG/3ML SOPN INJECT 0.25MG INTO THE SKIN WEEKLY FOR 4 WEEKS, THEN INJECT 0.5MG WEEKLY   [DISCONTINUED] Semaglutide, 1 MG/DOSE, 4 MG/3ML SOPN Inject 0.5 mg as directed once a week.   [DISCONTINUED] valsartan (DIOVAN) 160 MG tablet Take 1 tablet (160 mg total) by mouth daily.   No facility-administered medications prior to visit.    Review of Systems Review of Systems:  A fourteen system review of systems was performed and found to be positive as per HPI.  Last CBC Lab Results  Component Value Date   WBC 6.2 03/25/2022   HGB 14.3 03/25/2022   HCT 43.1 03/25/2022   MCV 88.0 03/25/2022   MCH 29.2 03/25/2022   RDW 12.7 03/25/2022   PLT 226 75/17/0017   Last metabolic panel Lab Results  Component Value Date   GLUCOSE 167 (H) 03/25/2022   NA 141 03/25/2022   K 5.2 03/25/2022   CL 105  03/25/2022   CO2 27 03/25/2022   BUN 24 03/25/2022   CREATININE 1.04 (H) 03/25/2022   EGFR 54 (L) 03/25/2022   CALCIUM 9.5 03/25/2022   PROT 6.5 03/25/2022   ALBUMIN 3.8 05/01/2021   LABGLOB 2.5 05/01/2021   AGRATIO 1.5 05/01/2021   BILITOT 0.4 03/25/2022   ALKPHOS 69 05/01/2021   AST 21 03/25/2022   ALT 22 03/25/2022   Last lipids Lab Results  Component Value Date   CHOL 234 (H) 03/25/2022   HDL 69 03/25/2022   LDLCALC 139 (H) 03/25/2022   TRIG 140 03/25/2022   CHOLHDL 3.4 03/25/2022   Last hemoglobin A1c Lab Results  Component Value Date   HGBA1C 6.1 05/04/2022    Last thyroid functions Lab Results  Component Value Date   TSH 4.560 (H) 03/20/2019   T3TOTAL 149 03/20/2019   Last vitamin D Lab Results  Component Value Date   VD25OH 54 11/04/2017     Objective    BP 138/72 (BP Location: Left Arm, Patient Position: Sitting, Cuff Size: Large)   Pulse (!) 101   Resp 18   Ht _0  (1.676 m)   Wt 244 lb (110.7 kg)   SpO2 96%   BMI 39.38 kg/m  BP Readings from Last 3 Encounters:  05/04/22 138/72  01/15/22 (!) 139/98  12/29/21 (!) 150/77   Wt Readings from Last 3 Encounters:  05/04/22 244 lb (110.7 kg)  01/15/22 239 lb 3.2 oz (108.5 kg)  12/29/21 237 lb (107.5 kg)    Physical Exam  General:  Cooperative, in no acute distress, appropriate for stated age.  Neuro:  Alert and oriented,  extra-ocular muscles intact  HEENT:  Normocephalic, atraumatic, neck supple  Skin:  no gross rash, warm, pink. Cardiac:  RRR, S1 S2 Respiratory: CTA B/L  Vascular:  Ext warm, no cyanosis apprec.;trace of pitting edema b/l Psych:  No HI/SI, judgement and insight good, Euthymic mood. Full Affect.   Results for orders placed or performed in visit on 05/04/22  POCT HgB A1C  Result Value Ref Range   Hemoglobin A1C     HbA1c POC (<> result, manual entry) 6.1 4.0 - 5.6 %   HbA1c, POC (prediabetic range)     HbA1c, POC (controlled diabetic range)      Assessment & Plan      Problem List Items Addressed This Visit       Cardiovascular and Mediastinum   Essential hypertension    -Stable, goal <140/90. Continue Valsartan 160 mg daily. Discussed CMP from October 2023 and ensuring adequate hydration. Patient has routine labs checked with rheumatology.      Relevant Medications   valsartan (DIOVAN) 160 MG tablet     Endocrine   Uncontrolled type 2 diabetes mellitus with hyperglycemia (HCC) - Primary    -A1c today has increased from 5.8 to 6.1, discussed with patient will resend rx for Ozempic 0.5 mg once a week. Due to side effects with glipizide  including weight gain recommend stopping medication and maintaining on GLP-1 agonist therapy such as Ozempic which also has cardiovascular benefits. Advised to notify the office if unable to get Ozempic. Recommend to continue ambulatory glucose monitoring and low carbohydrate/glucose diet. Recommend obtaining urine microalbumin at follow-up visit.       Relevant Medications   Semaglutide, 1 MG/DOSE, 4 MG/3ML SOPN   valsartan (DIOVAN) 160 MG tablet     Other   Hyperlipidemia    -Discussed lipid panel from October 2023 which mildly improved from prior.  Pt has declined medication therapy repeatedly due to side effect concerns. Recommend to continue to work on diet and lifestyle changes including heart healthy diet.       Relevant Medications   valsartan (DIOVAN) 160 MG tablet   Other Visit Diagnoses     Lower extremity edema       Urinary incontinence, unspecified type          Lower extremity edema: -Recommend to continue with low salt diet and elevation. Offered loop diuretic to take as needed, pt deferred at this time.  Urinary incontinence: -Discussed referral to urology/urogyn, pt deferred at this time. Recommend to avoid diuretic beverages.  Return in about 3 months (around 08/04/2022) for DM, HTN, HLD.        [name], PA-C  [facility] [phone] (phone) [phone] (fax)  [place]

## 2022-05-19 NOTE — Progress Notes (Signed)
Office Visit Note  Patient: Tina Hansen             Date of Birth: 05-09-1941           MRN: [id number]             PCP: Tina Reid, PA-C Referring: Tina Reid, PA-C Visit Date: 06/01/2022 Occupation: '@GUAROCC'$ @  Subjective:  Medication monitoring   History of Present Illness: Tina Hansen is a 81 y.o. female with history of seropositive rheumatoid arthritis and osteoarthritis.  She is taking Arava 10 mg alternating with 20 mg every other day.   She is tolerating arava without any side effects.  She denies any signs or symptoms of a rheumatoid arthritis flare.  She has clinically been doing well on arava as monotherapy.  Her morning stiffness has been lasting less than 5 minutes every morning.  She denies any nocturnal pain.  She denies any joint swelling.  She denies any recent or recurrent infections.  She denies any new medical conditions.  She has occasional fluid extension in her lower legs which improves with elevation.   Activities of Daily Living:  Patient reports morning stiffness for 5 minutes  Patient Denies nocturnal pain.  Difficulty dressing/grooming: Denies Difficulty climbing stairs: Reports Difficulty getting out of chair: Denies Difficulty using hands for taps, buttons, cutlery, and/or writing: Denies  Review of Systems  Constitutional:  Positive for fatigue.  HENT:  Positive for mouth dryness. Negative for mouth sores and nose dryness.   Eyes:  Negative for pain, visual disturbance and dryness.  Respiratory:  Positive for cough (HX of COPD). Negative for hemoptysis, shortness of breath and difficulty breathing.   Cardiovascular:  Negative for chest pain, palpitations, hypertension and swelling in legs/feet.  Gastrointestinal:  Negative for blood in stool, constipation and diarrhea.  Endocrine: Negative for increased urination.  Genitourinary:  Positive for nocturia. Negative for painful urination.  Musculoskeletal:  Positive for morning  stiffness. Negative for joint pain, joint pain, joint swelling, myalgias, muscle weakness, muscle tenderness and myalgias.  Skin:  Negative for color change, pallor, rash, hair loss, nodules/bumps, skin tightness, ulcers and sensitivity to sunlight.  Neurological:  Positive for dizziness (Hx of vertigo). Negative for numbness, headaches and weakness.  Hematological:  Negative for swollen glands.  Psychiatric/Behavioral:  Negative for depressed mood and sleep disturbance. The patient is not nervous/anxious.     PMFS History:  Patient Active Problem List   Diagnosis Date Noted   Uncontrolled type 2 diabetes mellitus with hyperglycemia ([place]) 09/01/2021   Pain 09/25/2019   Fatigue 09/25/2019   Statin declined 08/21/2019   Non compliance with medical treatment 08/21/2019   Cardiovascular disease 03/24/2019   Elevated LDL cholesterol level 11/07/2018   Pre-diabetes 11/07/2018   BMI 40.0-44.9, adult ([place]) 08/08/2018   Onychomycosis 04/06/2018   Decreased GFR 01/05/2018   Healthcare maintenance 09/08/2017   Lipoma of left upper extremity 08/09/2017   COPD mixed type ([place]) 06/28/2017   History of COPD 06/26/2016   History of hypertension 06/26/2016   Osteopenia of multiple sites 06/26/2016   Morbid obesity ([place]) 05/04/2016   High risk medication use 04/25/2016   Vocal cord dysfunction 04/29/2015   Seropositive rheumatoid arthritis of multiple sites ([place]) 04/29/2015   Hyperlipidemia 10/06/2007   Essential hypertension 10/06/2007   OTHER DISEASES OF VOCAL CORDS 10/06/2007   Dyspnea on exertion 10/06/2007   CERVICAL CANCER, HX OF 10/06/2007    Past Medical History:  Diagnosis Date   Asthma    Cervical  cancer ([place])    Diabetes ([place])    Emphysema lung ([place])    Hyperlipidemia    Hypertension    Rheumatoid arthritis(714.0)    [name]    Family History  Problem Relation Age of Onset   Asthma Mother    Breast cancer Mother    Cancer Mother    Alcohol abuse Father    Diabetes Son     Cancer Maternal Uncle    Heart attack Maternal Uncle    Past Surgical History:  Procedure Laterality Date   ABDOMINAL HYSTERECTOMY     BREAST SURGERY     Breat implants     LIPOMA EXCISION Left    shoulder    TUBAL LIGATION     TUMOR REMOVAL Right    FOREARM   Social History   Social History Narrative   Not on file   Immunization History  Administered Date(s) Administered   Pneumococcal Conjugate-13 04/06/2018   Pneumococcal Polysaccharide-23 06/27/2007, 03/22/2009   Zoster Recombinat (Shingrix) 05/11/2019, 07/14/2019   Zoster, Live 05/11/2019     Objective: Vital Signs: BP (!) 151/83 (BP Location: Left Wrist, Patient Position: Sitting, Cuff Size: Normal)   Pulse 92   Ht '5\' 7"'$  (1.702 m)   Wt 236 lb 9.6 oz (107.3 kg)   BMI 37.06 kg/m    Physical Exam Vitals and nursing note reviewed.  Constitutional:      Appearance: She is well-developed.  HENT:     Head: Normocephalic and atraumatic.  Eyes:     Conjunctiva/sclera: Conjunctivae normal.  Cardiovascular:     Rate and Rhythm: Normal rate and regular rhythm.     Heart sounds: Normal heart sounds.  Pulmonary:     Effort: Pulmonary effort is normal.     Breath sounds: Normal breath sounds.  Abdominal:     General: Bowel sounds are normal.     Palpations: Abdomen is soft.  Musculoskeletal:     Cervical back: Normal range of motion.  Lymphadenopathy:     Cervical: No cervical adenopathy.  Skin:    General: Skin is warm and dry.     Capillary Refill: Capillary refill takes less than 2 seconds.  Neurological:     Mental Status: She is alert and oriented to person, place, and time.  Psychiatric:        Behavior: Behavior normal.      Musculoskeletal Exam: C-spine has good range of motion.  No midline spinal tenderness.  No SI joint tenderness. Shoulder joints, elbow joints, wrist joints, MCPs, PIPs, DIPs have good range of motion with no synovitis.  PIP and DIP thickening consistent with osteoarthritis of  both hands.  [place] joint prominence bilaterally.  Hip joints have good range of motion with no groin pain.  Knee joints have good range of motion with no warmth or effusion.  Ankle joints have good range of motion with no tenderness or joint swelling.  CDAI Exam: CDAI Score: -- Patient Global: 1 mm; Provider Global: 1 mm Swollen: --; Tender: -- Joint Exam 06/01/2022   No joint exam has been documented for this visit   There is currently no information documented on the homunculus. Go to the Rheumatology activity and complete the homunculus joint exam.  Investigation: No additional findings.  Imaging: No results found.  Recent Labs: Lab Results  Component Value Date   WBC 6.2 03/25/2022   HGB 14.3 03/25/2022   PLT 226 03/25/2022   NA 141 03/25/2022   K 5.2 03/25/2022   CL 105  03/25/2022   CO2 27 03/25/2022   GLUCOSE 167 (H) 03/25/2022   BUN 24 03/25/2022   CREATININE 1.04 (H) 03/25/2022   BILITOT 0.4 03/25/2022   ALKPHOS 69 05/01/2021   AST 21 03/25/2022   ALT 22 03/25/2022   PROT 6.5 03/25/2022   ALBUMIN 3.8 05/01/2021   CALCIUM 9.5 03/25/2022   GFRAA 76 10/22/2020    Speciality Comments: Methotrexate discontinued due to elevated creatinine.  Arava start date February 06, 2020  Procedures:  No procedures performed Allergies: Other and Theophylline   Assessment / Plan:     Visit Diagnoses: Seropositive rheumatoid arthritis of multiple sites ([place]) - +RF, +CCP, h/o elevated sed rate: She has no synovitis on examination today.  She has not had any signs or symptoms of a rheumatoid arthritis flare.  She has clinically been doing well taking Arava 10 mg alternating with 20 mg every other day.  She continues to tolerate Arava without any side effects and has not missed any doses recently.  Her morning stiffness has been lasting less than 5 minutes daily.  She has not had any nocturnal pain or difficulty with ADLs.  She will remain on Areva as monotherapy.  She was advised to  notify us if she develops increased joint pain or joint swelling.  She will follow-up in the office in 5 months or sooner if needed.  High risk medication use - Arava 10 mg alternating with 20 mg every other day. CBC and CMP updated on 03/25/22. Her next lab work will be due in January and every 3 months.  Standing orders for CBC and CMP remain in place. She has not had any recent or recurrent infections.  Discussed the importance of holding arava if she develops signs or symptoms of an infection and to resume once the infection has completely cleared.   Primary osteoarthritis of both hands: She has PIP and DIP thickening consistent with osteoarthritis of both hands.  Complete fist formation bilaterally.  Trochanteric bursitis, right hip - Resolved.  Osteopenia of multiple sites - DEXA 05/06/2021: The BMD measured at Forearm Radius 33% is 0.739 g/cm2 with a T-1.7 scoreof -1.7.  Due to update DEXA in November 2024.  Discussed the importance of resistive exercises along with a calcium vitamin D supplement daily.  Vitamin D deficiency: She is taking vitamin D 1000 units daily.    Other medical conditions are listed as follows:  History of hypertension: BP was 151/83  today in the office.   History of diabetes mellitus  History of hyperlipidemia  Neuropathy  Vertigo  Vocal cord dysfunction - related to prior medication use per patient.  History of COPD  History of recent fall  Orders: No orders of the defined types were placed in this encounter.  No orders of the defined types were placed in this encounter.    Follow-Up Instructions: Return in about 5 months (around 10/31/2022) for Rheumatoid arthritis, Osteoarthritis.   Ofilia Neas, PA-C  Note - This record has been created using Dragon software.  Chart creation errors have been sought, but may not always  have been located. Such creation errors do not reflect on  the standard of medical care.

## 2022-05-21 ENCOUNTER — Other Ambulatory Visit: Payer: Self-pay | Admitting: Rheumatology

## 2022-05-21 NOTE — Telephone Encounter (Signed)
Next Visit: 06/01/2022   Last Visit: 01/15/2022   Last Fill: 02/10/2022   DX: Seropositive rheumatoid arthritis of multiple sites    Current Dose per office note 01/15/2022: Arava 10 mg alternating with 20 mg every other day.    Labs: 03/25/2022 Glucose is elevated.  Creatinine is mildly elevated.  LDL is elevated but improved.  CBC is normal.    Okay to refill Arava?

## 2022-06-01 ENCOUNTER — Ambulatory Visit: Payer: Medicare HMO | Attending: Physician Assistant | Admitting: Physician Assistant

## 2022-06-01 ENCOUNTER — Encounter: Payer: Self-pay | Admitting: Physician Assistant

## 2022-06-01 VITALS — BP 151/83 | HR 92 | Ht 67.0 in | Wt 236.6 lb

## 2022-06-01 DIAGNOSIS — R42 Dizziness and giddiness: Secondary | ICD-10-CM

## 2022-06-01 DIAGNOSIS — M19041 Primary osteoarthritis, right hand: Secondary | ICD-10-CM | POA: Diagnosis not present

## 2022-06-01 DIAGNOSIS — E559 Vitamin D deficiency, unspecified: Secondary | ICD-10-CM

## 2022-06-01 DIAGNOSIS — G629 Polyneuropathy, unspecified: Secondary | ICD-10-CM

## 2022-06-01 DIAGNOSIS — Z8679 Personal history of other diseases of the circulatory system: Secondary | ICD-10-CM

## 2022-06-01 DIAGNOSIS — J383 Other diseases of vocal cords: Secondary | ICD-10-CM | POA: Diagnosis not present

## 2022-06-01 DIAGNOSIS — M19042 Primary osteoarthritis, left hand: Secondary | ICD-10-CM

## 2022-06-01 DIAGNOSIS — Z8639 Personal history of other endocrine, nutritional and metabolic disease: Secondary | ICD-10-CM | POA: Diagnosis not present

## 2022-06-01 DIAGNOSIS — Z79899 Other long term (current) drug therapy: Secondary | ICD-10-CM

## 2022-06-01 DIAGNOSIS — Z8709 Personal history of other diseases of the respiratory system: Secondary | ICD-10-CM

## 2022-06-01 DIAGNOSIS — Z9181 History of falling: Secondary | ICD-10-CM

## 2022-06-01 DIAGNOSIS — M8589 Other specified disorders of bone density and structure, multiple sites: Secondary | ICD-10-CM

## 2022-06-01 DIAGNOSIS — M0579 Rheumatoid arthritis with rheumatoid factor of multiple sites without organ or systems involvement: Secondary | ICD-10-CM

## 2022-06-01 DIAGNOSIS — M7061 Trochanteric bursitis, right hip: Secondary | ICD-10-CM

## 2022-06-01 NOTE — Patient Instructions (Signed)
Standing Labs We placed an order today for your standing lab work.   Please have your standing labs drawn in January and every 3 months   Please have your labs drawn 2 weeks prior to your appointment so that the provider can discuss your lab results at your appointment.  Please note that you may see your imaging and lab results in [place] before we have reviewed them. We will contact you once all results are reviewed. Please allow our office up to 72 hours to thoroughly review all of the results before contacting the office for clarification of your results.  Lab hours are:   Monday through Thursday from 8:00 am -12:30 pm and 1:00 pm-5:00 pm and Friday from 8:00 am-12:00 pm.  Please be advised, all patients with office appointments requiring lab work will take precedent over walk-in lab work.   Labs are drawn by [facility]. Please bring your co-pay at the time of your lab draw.  You may receive a bill from [facility] for your lab work.  Please note if you are on Hydroxychloroquine and and an order has been placed for a Hydroxychloroquine level, you will need to have it drawn 4 hours or more after your last dose.  If you wish to have your labs drawn at another location, please call the office 24 hours in advance so we can fax the orders.  The office is located at [address], [place], [place], [place] [address] No appointment is necessary.    If you have any questions regarding directions or hours of operation,  please call [phone].   As a reminder, please drink plenty of water prior to coming for your lab work. Thanks!  If you have signs or symptoms of an infection or start antibiotics: First, call your PCP for workup of your infection. Hold your medication through the infection, until you complete your antibiotics, and until symptoms resolve if you take the following: Injectable medication (Actemra, Benlysta, Cimzia, Cosentyx, Enbrel, Humira, Kevzara, Orencia, Remicade, Simponi,  Stelara, Taltz, Tremfya) Methotrexate Leflunomide (Arava) Mycophenolate (Cellcept) Morrie Sheldon, Olumiant, or Rinvoq   Vaccines You are taking a medication(s) that can suppress your immune system.  The following immunizations are recommended: Flu annually Covid-19  Td/Tdap (tetanus, diphtheria, pertussis) every 10 years Pneumonia (Prevnar 15 then Pneumovax 23 at least 1 year apart.  Alternatively, can take Prevnar 20 without needing additional dose) Shingrix: 2 doses from 4 weeks to 6 months apart  Please check with your PCP to make sure you are up to date.

## 2022-07-06 ENCOUNTER — Other Ambulatory Visit: Payer: Self-pay | Admitting: Pulmonary Disease

## 2022-07-08 ENCOUNTER — Other Ambulatory Visit: Payer: Self-pay | Admitting: Nurse Practitioner

## 2022-07-08 ENCOUNTER — Other Ambulatory Visit: Payer: Self-pay

## 2022-07-08 ENCOUNTER — Telehealth: Payer: Self-pay | Admitting: *Deleted

## 2022-07-08 DIAGNOSIS — E1165 Type 2 diabetes mellitus with hyperglycemia: Secondary | ICD-10-CM

## 2022-07-08 MED ORDER — SEMAGLUTIDE (1 MG/DOSE) 4 MG/3ML ~~LOC~~ SOPN: 0.5000 mg | PEN_INJECTOR | SUBCUTANEOUS | 2 refills | Status: DC

## 2022-07-08 NOTE — Telephone Encounter (Signed)
[employer] specialist calling to say that patient has not been able to get her Ozempic due to not having it.  They have checked with several local pharmacies and are requesting it to be sent to [facility].    Semaglutide, 1 MG/DOSE, 4 MG/3ML [facility]    [facility].

## 2022-07-08 NOTE — Telephone Encounter (Signed)
Rx was sent to [facility]

## 2022-07-08 NOTE — Telephone Encounter (Signed)
Can you do me a favor and ask the patient is she is taking 0.'5mg'$  or 1 mg weekly. I see in the note, that the prescription is written as 1 mg per dose, but should take 0.5 mg weekly. I know that the pen deliveries are different.  Please and thank you.

## 2022-07-09 NOTE — Telephone Encounter (Signed)
Patient states she has been taking 0.'5mg'$  which has not been helping her blood sugars. BS has been running in the 300s.

## 2022-07-14 ENCOUNTER — Ambulatory Visit
Admission: EM | Admit: 2022-07-14 | Discharge: 2022-07-14 | Disposition: A | Discharge status: Home / Self Care | Payer: Medicare HMO | Attending: Internal Medicine | Admitting: Internal Medicine

## 2022-07-14 DIAGNOSIS — I10 Essential (primary) hypertension: Secondary | ICD-10-CM | POA: Diagnosis not present

## 2022-07-14 DIAGNOSIS — E1165 Type 2 diabetes mellitus with hyperglycemia: Secondary | ICD-10-CM

## 2022-07-14 LAB — POCT FASTING CBG KUC MANUAL ENTRY: POCT Glucose (KUC): 334 mg/dL — AB (ref 70–99)

## 2022-07-14 MED ORDER — HYDRALAZINE HCL 10 MG PO TABS: 10.0000 mg | ORAL_TABLET | Freq: Three times a day (TID) | ORAL | 0 refills | Status: DC

## 2022-07-14 NOTE — ED Triage Notes (Addendum)
[facility] with [facility] of high blood sugars and htn. Pt reports only symptom is brain fogg. Pt reports she has been having these issues for 2-3 weeks. She was diagnosed with dm 1 year ago and was treated with ozempic and then switched medications and then was recently put back on ozempic due to an elevated A1c. Pt reports he bs have been anywhere from 200-400 fasting, she has been taking her medications and eating well. No symptoms of illness denies fever, sob, congesion. Pt denies any polyuria, polydipsia, polyphagia.   Pt is scheduled to see pcp on 2/13 but is concerned about sugar levels, now and they are not able to see her until then.

## 2022-07-14 NOTE — Discharge Instructions (Signed)
I have started you on a new blood pressure medication. please pick this up from the pharmacy.  Monitor your blood pressure very closely at home.  I would also like you to increase your Ozempic dose to 1 mg.  It appears that your PCP has already sent this in for you so it should be arriving soon.  Monitor blood sugar very closely at home.  Please go to the ER if blood sugar is consistently 400 or higher or if your blood pressure is consistently 170s at the top number or higher.

## 2022-07-14 NOTE — ED Provider Notes (Signed)
[facility]    CSN: [id number] Arrival date & time: 07/14/22  1250      History   Chief Complaint Chief Complaint  Patient presents with   Blood Sugar Problem   Hypertension    HPI Tina Hansen is a 82 y.o. female.   Patient presents today with concerns of high blood pressure and high blood glucose.  Patient reports that she has been measuring her blood pressure and blood glucose over the past week or so.  Her systolic blood pressure has been ranging from 140s to 190s with averaging in the 007M and diastolic blood pressure has been in the 100s.  States that her blood sugar has been ranging in the high 200s to 300s.  She has been trying to eat more appropriate meals but states that this has not been helpful.  She reports that she does have dizziness but this is baseline for her.  Denies headache, blurred vision, nausea, vomiting, chest pain.  She does have shortness of breath at baseline given history of emphysema but states it has not increased since symptoms started.   Hypertension    Past Medical History:  Diagnosis Date   Asthma    Cervical cancer ([place])    Diabetes ([place])    Emphysema lung ([place])    Hyperlipidemia    Hypertension    Rheumatoid arthritis(714.0)    [name]    Patient Active Problem List   Diagnosis Date Noted   Uncontrolled type 2 diabetes mellitus with hyperglycemia ([place]) 09/01/2021   Pain 09/25/2019   Fatigue 09/25/2019   Statin declined 08/21/2019   Non compliance with medical treatment 08/21/2019   Cardiovascular disease 03/24/2019   Elevated LDL cholesterol level 11/07/2018   Pre-diabetes 11/07/2018   BMI 40.0-44.9, adult ([place]) 08/08/2018   Onychomycosis 04/06/2018   Decreased GFR 01/05/2018   Healthcare maintenance 09/08/2017   Lipoma of left upper extremity 08/09/2017   COPD mixed type ([place]) 06/28/2017   History of COPD 06/26/2016   History of hypertension 06/26/2016   Osteopenia of multiple sites 06/26/2016    Morbid obesity ([place]) 05/04/2016   High risk medication use 04/25/2016   Vocal cord dysfunction 04/29/2015   Seropositive rheumatoid arthritis of multiple sites ([place]) 04/29/2015   Hyperlipidemia 10/06/2007   Essential hypertension 10/06/2007   OTHER DISEASES OF VOCAL CORDS 10/06/2007   Dyspnea on exertion 10/06/2007   CERVICAL CANCER, HX OF 10/06/2007    Past Surgical History:  Procedure Laterality Date   ABDOMINAL HYSTERECTOMY     BREAST SURGERY     Breat implants     LIPOMA EXCISION Left    shoulder    TUBAL LIGATION     TUMOR REMOVAL Right    FOREARM    OB History   No obstetric history on file.      Home Medications    Prior to Admission medications   Medication Sig Start Date End Date Taking? Authorizing Provider  hydrALAZINE (APRESOLINE) 10 MG tablet Take 1 tablet (10 mg total) by mouth 3 (three) times daily. 07/14/22  Yes , Hildred Alamin E, FNP  albuterol (PROVENTIL) (2.5 MG/3ML) 0.083% nebulizer solution USE ONE VIAL IN NEBULIZER EVERY 6 HOURS AS NEEDED 07/11/18   Noralee Space, MD  albuterol (VENTOLIN HFA) 108 (90 Base) MCG/ACT inhaler INHALE 1 TO 2 PUFFS BY MOUTH EVERY 6 HOURS AS NEEDED FOR WHEEZING AND FOR SHORTNESS OF BREATH 04/30/20   Mannam, Hart Robinsons, MD  Ascorbic Acid (VITAMIN C PO) Take by mouth daily.  [provider]  Biotin 5000 MCG CAPS Take 10,000 mcg by mouth daily.    [provider]  calcium gluconate 500 MG tablet Take 500 mg by mouth daily.    [provider]  Cholecalciferol (VITAMIN D3) 1000 units CAPS Take 1 capsule by mouth daily.     [provider]  Chromium 1 MG CAPS Take 1 mg by mouth daily. Increase to 2 caps if needed    [provider]  Coenzyme Q10 (COQ-10) 100 MG CAPS Take 1 capsule by mouth daily.    [provider]  ECHINACEA PO Take by mouth daily.    [provider]  fish oil-omega-3 fatty acids 1000 MG capsule Take 2 capsules by mouth daily.    [provider]   glipiZIDE (GLUCOTROL) 5 MG tablet TAKE 1/2 (ONE-HALF) TABLET BY MOUTH ONCE DAILY BEFORE SUPPER Patient not taking: Reported on 06/01/2022 01/22/22   [name], PA-C  leflunomide (ARAVA) 20 MG tablet TAKE 1/2 TABLET EVERY OTHER DAY ALTERNATING WITH 1 TABLET EVERY OTHER DAY 05/21/22   Ofilia Neas, PA-C  LUTEIN PO Take by mouth daily.    [provider]  Methylsulfonylmethane (MSM PO) Take by mouth daily.    [provider]  Milk Thistle 200 MG CAPS Take 400 mg by mouth daily.    [provider]  Multiple Vitamin (MULTIVITAMIN) capsule Take 1 capsule by mouth daily.    [provider]  NON FORMULARY Take 1 capsule by mouth daily. Collagen capsule    [provider]  Semaglutide, 1 MG/DOSE, (OZEMPIC, 1 MG/DOSE,) 4 MG/3ML SOPN Inject 1 mg into the skin once a week. 07/10/22   Boscia, Greer Ee, NP  TRELEGY ELLIPTA 100-62.5-25 MCG/ACT AEPB INHALE 1 PUFF INTO LUNGS ONCE DAILY 07/06/22   Mannam, Hart Robinsons, MD  valsartan (DIOVAN) 160 MG tablet Take 1 tablet (160 mg total) by mouth daily. 05/04/22   [name], PA-C  vitamin E 180 MG (400 UNITS) capsule Take 400 Units by mouth daily.    [provider]    Family History Family History  Problem Relation Age of Onset   Asthma Mother    Breast cancer Mother    Cancer Mother    Alcohol abuse Father    Diabetes Son    Cancer Maternal Uncle    Heart attack Maternal Uncle     Social History Social History   Tobacco Use   Smoking status: Former    Packs/day: 1.00    Years: 30.00    Total pack years: 30.00    Types: Cigarettes    Quit date: 06/22/1985    Years since quitting: 37.0    Passive exposure: Never   Smokeless tobacco: Never  Vaping Use   Vaping Use: Never used  Substance Use Topics   Alcohol use: Yes    Comment: OCCASSIONAL GLASS OF WINE 1-2 MONTHLY   Drug use: No     Allergies   Other and Theophylline   Review of Systems Review of Systems Per HPI  Physical  Exam Triage Vital Signs ED Triage Vitals [07/14/22 1311]  Enc Vitals Group     BP 135/88     Pulse Rate 92     Resp 20     Temp (!) 97.5 F (36.4 C)     Temp src      SpO2 92 %     Weight      Height      Head Circumference  Peak Flow      Pain Score 0     Pain Loc      Pain Edu?      Excl. in [place]?    No data found.  Updated Vital Signs BP 135/88   Pulse 92   Temp (!) 97.5 F (36.4 C)   Resp 20   SpO2 95%   Visual Acuity Right Eye Distance:   Left Eye Distance:   Bilateral Distance:    Right Eye Near:   Left Eye Near:    Bilateral Near:     Physical Exam Constitutional:      General: She is not in acute distress.    Appearance: Normal appearance. She is not toxic-appearing or diaphoretic.  HENT:     Head: Normocephalic and atraumatic.  Eyes:     Extraocular Movements: Extraocular movements intact.     Conjunctiva/sclera: Conjunctivae normal.     Pupils: Pupils are equal, round, and reactive to light.  Cardiovascular:     Rate and Rhythm: Normal rate and regular rhythm.     Pulses: Normal pulses.     Heart sounds: Normal heart sounds.  Pulmonary:     Effort: Pulmonary effort is normal. No respiratory distress.     Breath sounds: Normal breath sounds.  Neurological:     General: No focal deficit present.     Mental Status: She is alert and oriented to person, place, and time. Mental status is at baseline.     Cranial Nerves: Cranial nerves 2-12 are intact.     Sensory: Sensation is intact.     Motor: Motor function is intact.     Coordination: Coordination is intact.     Gait: Gait is intact.  Psychiatric:        Mood and Affect: Mood normal.        Behavior: Behavior normal.        Thought Content: Thought content normal.        Judgment: Judgment normal.      UC Treatments / Results  Labs (all labs ordered are listed, but only abnormal results are displayed) Labs Reviewed  POCT FASTING CBG [facility] MANUAL ENTRY - Abnormal; Notable for the  following components:      Result Value   POCT Glucose ([facility]) 334 (*)    All other components within normal limits  BASIC METABOLIC PANEL    EKG   Radiology No results found.  Procedures Procedures (including critical care time)  Medications Ordered in UC Medications - No data to display  Initial Impression / Assessment and Plan / UC Course  I have reviewed the triage vital signs and the nursing notes.  Pertinent labs & imaging results that were available during my care of the patient were reviewed by me and considered in my medical decision making (see chart for details).     Patient presents today with concerns of hypertension and high blood glucose.  Limited options on management for hypertension given patient is already on max dose of valsartan and cannot tolerate HCTZ, amlodipine, lisinopril after further review of the chart.  Also not a candidate for beta-blockers given history of emphysema.  Therefore, only option that is safe for patient would be hydralazine.  will start this at a low dose.  Patient advised to monitor blood pressure very closely after initiating this medication.  Blood glucose today was in the 300s. Patient is a poor historian but reports that she thinks that she takes 0.5 mg of Ozempic  weekly.  Last dose was 1 week ago.  It appears that patient's PCP refilled Ozempic at 1 mg per injection a few days prior.  Do think it is reasonable for patient to increase this dose to help control blood sugar as patient has not been able to previously tolerate metformin and glipizide due to adverse effects.  therefore, limited options on blood sugar control as well.  Will avoid insulin given combination of Ozempic may have adverse effects.  Patient states that her Ozempic gets mailed to her.  Advised patient that if she has questions about the new dose during administration to follow-up at the pharmacy, urgent care, or PCP for education.  Advised low-carb diet and low salt intake  as well to help with blood pressure and blood sugar.  Will obtain BMP today.  She was advised of the importance of following up with PCP as soon as possible for further evaluation and management.  She states that she has an appointment on 2/13 but encouraged patient to attempt to get an earlier appointment.  Patient is asymptomatic regarding blood pressure and blood sugar as well as vital signs are stable so do not think that emergent evaluation is necessary at this time.  Advised patient to go straight to the ER if blood pressure is continuously high of if blood sugar increases consistently.  Patient verbalized understanding and was agreeable with plan. Final Clinical Impressions(s) / UC Diagnoses   Final diagnoses:  Essential hypertension  Uncontrolled type 2 diabetes mellitus with hyperglycemia ([place])     Discharge Instructions      I have started you on a new blood pressure medication. please pick this up from the pharmacy.  Monitor your blood pressure very closely at home.  I would also like you to increase your Ozempic dose to 1 mg.  It appears that your PCP has already sent this in for you so it should be arriving soon.  Monitor blood sugar very closely at home.  Please go to the ER if blood sugar is consistently 400 or higher or if your blood pressure is consistently 170s at the top number or higher.    ED Prescriptions     Medication Sig Dispense Auth. Provider   hydrALAZINE (APRESOLINE) 10 MG tablet Take 1 tablet (10 mg total) by mouth 3 (three) times daily. 90 tablet [place], Michele Rockers, [place]      PDMP not reviewed this encounter.   Teodora Medici, [place] 07/14/22 1421

## 2022-07-15 LAB — BASIC METABOLIC PANEL
BUN/Creatinine Ratio: 17 (ref 12–28)
BUN: 18 mg/dL (ref 8–27)
CO2: 19 mmol/L — ABNORMAL LOW (ref 20–29)
Calcium: 9.9 mg/dL (ref 8.7–10.3)
Chloride: 98 mmol/L (ref 96–106)
Creatinine, Ser: 1.07 mg/dL — ABNORMAL HIGH (ref 0.57–1.00)
Glucose: 360 mg/dL — ABNORMAL HIGH (ref 70–99)
Potassium: 5.1 mmol/L (ref 3.5–5.2)
Sodium: 134 mmol/L (ref 134–144)
eGFR: 52 mL/min/{1.73_m2} — ABNORMAL LOW (ref >59)

## 2022-07-16 ENCOUNTER — Telehealth: Payer: Self-pay | Admitting: *Deleted

## 2022-07-16 NOTE — Telephone Encounter (Signed)
Pt calling to inform provider that she was seen in UC.  Just wanted to let us know and I offered her an appointment to come in tomorrow just to go over everything and she said that she would see how things go after being on the medicine they put her on for several days and that if she feels like it is not helping she would call us to see if we could get her in otherwise she is going to wait until her upcoming appointment on 08/04/22.

## 2022-07-17 ENCOUNTER — Other Ambulatory Visit: Payer: Self-pay

## 2022-07-17 ENCOUNTER — Emergency Department (HOSPITAL_COMMUNITY): Payer: Medicare HMO

## 2022-07-17 ENCOUNTER — Emergency Department (HOSPITAL_COMMUNITY)
Admission: EM | Admit: 2022-07-17 | Discharge: 2022-07-17 | Disposition: A | Discharge status: Home / Self Care | Payer: Medicare HMO | Attending: Student | Admitting: Student

## 2022-07-17 DIAGNOSIS — Z79899 Other long term (current) drug therapy: Secondary | ICD-10-CM | POA: Insufficient documentation

## 2022-07-17 DIAGNOSIS — J449 Chronic obstructive pulmonary disease, unspecified: Secondary | ICD-10-CM | POA: Diagnosis not present

## 2022-07-17 DIAGNOSIS — R42 Dizziness and giddiness: Secondary | ICD-10-CM

## 2022-07-17 DIAGNOSIS — I10 Essential (primary) hypertension: Secondary | ICD-10-CM | POA: Diagnosis not present

## 2022-07-17 DIAGNOSIS — Z8541 Personal history of malignant neoplasm of cervix uteri: Secondary | ICD-10-CM | POA: Diagnosis not present

## 2022-07-17 DIAGNOSIS — R739 Hyperglycemia, unspecified: Secondary | ICD-10-CM | POA: Diagnosis not present

## 2022-07-17 DIAGNOSIS — E1165 Type 2 diabetes mellitus with hyperglycemia: Secondary | ICD-10-CM | POA: Insufficient documentation

## 2022-07-17 LAB — CBC WITH DIFFERENTIAL/PLATELET
Abs Immature Granulocytes: 0.03 10*3/uL (ref 0.00–0.07)
Basophils Absolute: 0.1 10*3/uL (ref 0.0–0.1)
Basophils Relative: 1 %
Eosinophils Absolute: 0.2 10*3/uL (ref 0.0–0.5)
Eosinophils Relative: 4 %
HCT: 45.9 % (ref 36.0–46.0)
Hemoglobin: 15.4 g/dL — ABNORMAL HIGH (ref 12.0–15.0)
Immature Granulocytes: 1 %
Lymphocytes Relative: 21 %
Lymphs Abs: 1.3 10*3/uL (ref 0.7–4.0)
MCH: 29.6 pg (ref 26.0–34.0)
MCHC: 33.6 g/dL (ref 30.0–36.0)
MCV: 88.1 fL (ref 80.0–100.0)
Monocytes Absolute: 0.7 10*3/uL (ref 0.1–1.0)
Monocytes Relative: 11 %
Neutro Abs: 3.9 10*3/uL (ref 1.7–7.7)
Neutrophils Relative %: 62 %
Platelets: 217 10*3/uL (ref 150–400)
RBC: 5.21 MIL/uL — ABNORMAL HIGH (ref 3.87–5.11)
RDW: 13.3 % (ref 11.5–15.5)
WBC: 6.2 10*3/uL (ref 4.0–10.5)
nRBC: 0 % (ref 0.0–0.2)

## 2022-07-17 LAB — I-STAT VENOUS BLOOD GAS, ED
Acid-base deficit: 2 mmol/L (ref 0.0–2.0)
Bicarbonate: 22.4 mmol/L (ref 20.0–28.0)
Calcium, Ion: 1.05 mmol/L — ABNORMAL LOW (ref 1.15–1.40)
HCT: 45 % (ref 36.0–46.0)
Hemoglobin: 15.3 g/dL — ABNORMAL HIGH (ref 12.0–15.0)
O2 Saturation: 76 %
Potassium: 4.6 mmol/L (ref 3.5–5.1)
Sodium: 134 mmol/L — ABNORMAL LOW (ref 135–145)
TCO2: 23 mmol/L (ref 22–32)
pCO2, Ven: 35.5 mmHg — ABNORMAL LOW (ref 44–60)
pH, Ven: 7.408 (ref 7.25–7.43)
pO2, Ven: 40 mmHg (ref 32–45)

## 2022-07-17 LAB — URINALYSIS, ROUTINE W REFLEX MICROSCOPIC
Bilirubin Urine: NEGATIVE
Glucose, UA: >=500 mg/dL — AB
Hgb urine dipstick: NEGATIVE
Ketones, ur: 5 mg/dL — AB
Leukocytes,Ua: NEGATIVE
Nitrite: NEGATIVE
Protein, ur: NEGATIVE mg/dL
Specific Gravity, Urine: 1.024 (ref 1.005–1.030)
pH: 5 (ref 5.0–8.0)

## 2022-07-17 LAB — COMPREHENSIVE METABOLIC PANEL
ALT: 27 U/L (ref 0–44)
AST: 29 U/L (ref 15–41)
Albumin: 3.9 g/dL (ref 3.5–5.0)
Alkaline Phosphatase: 59 U/L (ref 38–126)
Anion gap: 11 (ref 5–15)
BUN: 19 mg/dL (ref 8–23)
CO2: 23 mmol/L (ref 22–32)
Calcium: 9.9 mg/dL (ref 8.9–10.3)
Chloride: 99 mmol/L (ref 98–111)
Creatinine, Ser: 1.26 mg/dL — ABNORMAL HIGH (ref 0.44–1.00)
GFR, Estimated: 43 mL/min — ABNORMAL LOW (ref >60)
Glucose, Bld: 328 mg/dL — ABNORMAL HIGH (ref 70–99)
Potassium: 4.6 mmol/L (ref 3.5–5.1)
Sodium: 133 mmol/L — ABNORMAL LOW (ref 135–145)
Total Bilirubin: 0.6 mg/dL (ref 0.3–1.2)
Total Protein: 6.9 g/dL (ref 6.5–8.1)

## 2022-07-17 LAB — CBG MONITORING, ED
Glucose-Capillary: 311 mg/dL — ABNORMAL HIGH (ref 70–99)
Glucose-Capillary: 271 mg/dL — ABNORMAL HIGH (ref 70–99)

## 2022-07-17 LAB — TROPONIN I (HIGH SENSITIVITY)
Troponin I (High Sensitivity): 14 ng/L (ref <18)
Troponin I (High Sensitivity): 6 ng/L (ref <18)

## 2022-07-17 MED ORDER — SODIUM CHLORIDE 0.9 % IV BOLUS
1000.0000 mL | Freq: Once | INTRAVENOUS | Status: AC
  Administered 2022-07-17: 1000 mL via INTRAVENOUS

## 2022-07-17 MED ORDER — HYDRALAZINE HCL 25 MG PO TABS
50.0000 mg | ORAL_TABLET | Freq: Once | ORAL | Status: AC
  Administered 2022-07-17: 50 mg via ORAL
  Filled 2022-07-17: qty 2

## 2022-07-17 MED ORDER — HYDRALAZINE HCL 10 MG PO TABS
10.0000 mg | ORAL_TABLET | Freq: Once | ORAL | Status: AC
  Administered 2022-07-17: 10 mg via ORAL
  Filled 2022-07-17: qty 1

## 2022-07-17 NOTE — ED Provider Notes (Signed)
  Physical Exam  BP (!) 204/117   Pulse (!) 106   Temp 97.8 F (36.6 C)   Resp 17   SpO2 100%   Physical Exam  Procedures  Procedures  ED Course / MDM   Clinical Course as of 07/17/22 1634  Fri Jul 17, 2022  1600 Uncontrolled hytn and hyperglycemia. Vague balance issues for 1 week. Nothing focal  MRI looked at by neuro. Neg. Trying to admit for HTN emergency/urgency.   [ ]  FU Admission  [JS]    Clinical Course User Index [JS] Donzetta Matters, MD   Medical Decision Making SOLANGEL MCMANAWAY is an 82 year old female past medical history as documented above in initial HPI note.  Per family medicine consult patient is suitable for home discharge at this time.  Plan is for patient to increase valsartan to 320 mg daily and continue hydralazine.  It is comfortable this plan will follow-up with PCP on Monday.  Patient has no focal neurological deficits on discharge.   Amount and/or Complexity of Data Reviewed Labs: ordered. Radiology: ordered.  Risk Prescription drug management. Decision regarding hospitalization.          Donzetta Matters, MD 07/17/22 1742    Gareth Morgan, MD 09/06/22 [phone]

## 2022-07-17 NOTE — ED Notes (Signed)
Pt transported to MRI 

## 2022-07-17 NOTE — ED Provider Notes (Signed)
Patient care assumed at shift handoff from Quentin Mulling, PA-C  In short, patient is an 82 year old female with past history significant for COPD, hyperlipidemia, hypertension, cervical cancer, obesity, diabetes, who presents to the emergency department concerned about poorly controlled hyperglycemia and hypertension for several weeks.  Initial CBG was 354.  Initial blood pressure 222/136.  Patient reports taking 160 mg valsartan for the past 2 years.  On Tuesday she was started on hydralazine 10 mg 3 times daily.  She denies any missed doses of her blood pressure medications.  She does endorse a feeling of lightheadedness over the past 3 to 4 days which is worse than baseline.  She has a history of vertigo but states this feels somewhat different.  Patient denies headache, numbness, tingling, dysarthria  Labs completed prior to my assumption of care were grossly unremarkable.  CT head with no acute findings.  Chest x-ray with probable emphysema.  EKG was sinus tachycardia at a rate of 105 Physical Exam  BP (!) 208/105   Pulse 83   Temp 98.2 F (36.8 C) (Oral)   Resp 13   SpO2 96%   Physical Exam Vitals and nursing note reviewed.  Constitutional:      General: She is not in acute distress. HENT:     Head: Normocephalic.  Eyes:     Conjunctiva/sclera: Conjunctivae normal.  Cardiovascular:     Rate and Rhythm: Normal rate.  Pulmonary:     Effort: Pulmonary effort is normal.  Neurological:     Mental Status: She is alert.     Procedures  Procedures  ED Course / MDM   Clinical Course as of 07/17/22 1602  Fri Jul 17, 2022  1600 Uncontrolled hytn and hyperglycemia. Vague balance issues for 1 week. Nothing focal  MRI looked at by neuro. Neg. Trying to admit for HTN emergency/urgency.   '[ ]'$  FU Admission  [JS]    Clinical Course User Index [JS] Donzetta Matters, MD   Medical Decision Making Amount and/or Complexity of Data Reviewed Labs: ordered. Radiology:  ordered.  Risk Prescription drug management. Decision regarding hospitalization.   I reassessed the patient and noted that she was still hypertensive with a blood pressure of 208/105.  The patient had her home hydralazine and continued to be hypertensive  MRI brain was ordered.  Findings initially concerning for possible recent infarct.  Discussed case with neurology who personally reviewed imaging.  States this does not appear to stroke appears to be artifact.  I requested consultation with the family medicine team who agreed to see the patient in consult for possible admission.  Patient care being transferred to Dr. Nicole Kindred at shift change.  Disposition pending recommendations of family medicine team versus admission for hypertensive urgency       Ronny Bacon 07/17/22 1603    Gareth Morgan, MD 07/18/22 [phone]

## 2022-07-17 NOTE — ED Provider Notes (Signed)
[facility] Provider Note   CSN: [id number] Arrival date & time: 07/17/22  0250     History {Add pertinent medical, surgical, social history, OB history to HPI:1} Chief Complaint  Patient presents with   Hypertension / Hyperglycemia    Tina Hansen is a 82 y.o. female with past medical history significant for COPD, hyperlipidemia, hypertension, cervical cancer, obesity, diabetes who presents with concern for poorly controlled hyperglycemia, hypertension for several weeks.  Blood pressure 222/136 and CBG 354 on arrival.  Patient reports that she was recently started on hydralazine 10 mg 3 times daily, she has been taking valsartan 160 mg for at least 2 years.  She reports that she has not missed any doses of her blood pressure medication.  She denies any chest pain, she reports that she has been feeling dizzy, lightheaded more than baseline.  She reports that she had a bout of severe vertigo last year, she was never formally evaluated or diagnosed with BPPV, she reports dizziness worsening over the last 1 to 2 weeks.  She denies any numbness, tingling, dysarthria, she reports feeling "half brained" which she describes as more confused or disoriented than normal.  HPI     Home Medications Prior to Admission medications   Medication Sig Start Date End Date Taking? Authorizing Provider  albuterol (PROVENTIL) (2.5 MG/3ML) 0.083% nebulizer solution USE ONE VIAL IN NEBULIZER EVERY 6 HOURS AS NEEDED 07/11/18   Noralee Space, MD  albuterol (VENTOLIN HFA) 108 (90 Base) MCG/ACT inhaler INHALE 1 TO 2 PUFFS BY MOUTH EVERY 6 HOURS AS NEEDED FOR WHEEZING AND FOR SHORTNESS OF BREATH 04/30/20   Mannam, Hart Robinsons, MD  Ascorbic Acid (VITAMIN C PO) Take by mouth daily.    [provider]  Biotin 5000 MCG CAPS Take 10,000 mcg by mouth daily.    [provider]  calcium gluconate 500 MG tablet Take 500 mg by mouth daily.    [provider]  Cholecalciferol (VITAMIN D3) 1000 units CAPS Take 1 capsule by mouth daily.     [provider]  Chromium 1 MG CAPS Take 1 mg by mouth daily. Increase to 2 caps if needed    [provider]  Coenzyme Q10 (COQ-10) 100 MG CAPS Take 1 capsule by mouth daily.    [provider]  ECHINACEA PO Take by mouth daily.    [provider]  fish oil-omega-3 fatty acids 1000 MG capsule Take 2 capsules by mouth daily.    [provider]  glipiZIDE (GLUCOTROL) 5 MG tablet TAKE 1/2 (ONE-HALF) TABLET BY MOUTH ONCE DAILY BEFORE SUPPER Patient not taking: Reported on 06/01/2022 01/22/22   [name], PA-C  hydrALAZINE (APRESOLINE) 10 MG tablet Take 1 tablet (10 mg total) by mouth 3 (three) times daily. 07/14/22   Teodora Medici, FNP  leflunomide (ARAVA) 20 MG tablet TAKE 1/2 TABLET EVERY OTHER DAY ALTERNATING WITH 1 TABLET EVERY OTHER DAY 05/21/22   Ofilia Neas, PA-C  LUTEIN PO Take by mouth daily.    [provider]  Methylsulfonylmethane (MSM PO) Take by mouth daily.    [provider]  Milk Thistle 200 MG CAPS Take 400 mg by mouth daily.    [provider]  Multiple Vitamin (MULTIVITAMIN) capsule Take 1 capsule by mouth daily.    [provider]  NON FORMULARY Take 1 capsule by mouth daily. Collagen capsule    [provider]  Semaglutide, 1 MG/DOSE, (OZEMPIC, 1  MG/DOSE,) 4 MG/3ML SOPN Inject 1 mg into the skin once a week. 07/10/22   Boscia, Greer Ee, NP  TRELEGY ELLIPTA 100-62.5-25 MCG/ACT AEPB INHALE 1 PUFF INTO LUNGS ONCE DAILY 07/06/22   Mannam, Hart Robinsons, MD  valsartan (DIOVAN) 160 MG tablet Take 1 tablet (160 mg total) by mouth daily. 05/04/22   [name], PA-C  vitamin E 180 MG (400 UNITS) capsule Take 400 Units by mouth daily.    [provider]      Allergies    Other and Theophylline    Review of Systems   Review of Systems  Physical Exam Updated Vital Signs BP (!) 195/119 (BP  Location: Left Arm)   Pulse (!) 105   Temp 98.2 F (36.8 C) (Oral)   Resp 20   SpO2 96%  Physical Exam  ED Results / Procedures / Treatments   Labs (all labs ordered are listed, but only abnormal results are displayed) Labs Reviewed  URINALYSIS, ROUTINE W REFLEX MICROSCOPIC - Abnormal; Notable for the following components:      Result Value   Color, Urine AMBER (*)    APPearance CLOUDY (*)    Glucose, UA >=500 (*)    Ketones, ur 5 (*)    Bacteria, UA RARE (*)    All other components within normal limits  CBC WITH DIFFERENTIAL/PLATELET - Abnormal; Notable for the following components:   RBC 5.21 (*)    Hemoglobin 15.4 (*)    All other components within normal limits  COMPREHENSIVE METABOLIC PANEL - Abnormal; Notable for the following components:   Sodium 133 (*)    Glucose, Bld 328 (*)    Creatinine, Ser 1.26 (*)    GFR, Estimated 43 (*)    All other components within normal limits  CBG MONITORING, ED - Abnormal; Notable for the following components:   Glucose-Capillary 311 (*)    All other components within normal limits  BLOOD GAS, VENOUS  TROPONIN I (HIGH SENSITIVITY)    EKG None  Radiology No results found.  Procedures Procedures  {Document cardiac monitor, telemetry assessment procedure when appropriate:1}  Medications Ordered in ED Medications  hydrALAZINE (APRESOLINE) tablet 50 mg (has no administration in time range)  sodium chloride 0.9 % bolus 1,000 mL (has no administration in time range)    ED Course/ Medical Decision Making/ A&P   {   Click here for ABCD2, HEART and other calculatorsREFRESH Note before signing :1}                          Medical Decision Making Amount and/or Complexity of Data Reviewed Labs: ordered. Radiology: ordered.  Risk Prescription drug management.   This patient is a 82 y.o. female who presents to the ED for concern of ***, this involves an extensive number of treatment options, and is a complaint that carries  with it a high risk of complications and morbidity. The emergent differential diagnosis prior to evaluation includes, but is not limited to,  *** . This is not an exhaustive differential.   Past Medical History / Co-morbidities / Social History: ***  Additional history: Chart reviewed. Pertinent results include: ***  Physical Exam: Physical exam performed. The pertinent findings include: ***  Lab Tests: I ordered, and personally interpreted labs.  The pertinent results include:  ***   Imaging Studies: I ordered imaging studies including ***. I independently visualized and interpreted imaging which showed ***. I agree with the radiologist interpretation.   Cardiac Monitoring:  The patient was maintained on a cardiac monitor.  My attending physician Dr. Marland Kitchen viewed and interpreted the cardiac monitored which showed an underlying rhythm of: ***. I agree with this interpretation.   Medications: I ordered medication including ***  for ***. Reevaluation of the patient after these medicines showed that the patient {resolved/improved/worsened:23923::"improved"}. I have reviewed the patients home medicines and have made adjustments as needed.  Consultations Obtained: I requested consultation with the ***,  and discussed lab and imaging findings as well as pertinent plan - they recommend: ***   Disposition: After consideration of the diagnostic results and the patients response to treatment, I feel that *** .   ***emergency department workup does not suggest an emergent condition requiring admission or immediate intervention beyond what has been performed at this time. The plan is: ***. The patient is safe for discharge and has been instructed to return immediately for worsening symptoms, change in symptoms or any other concerns.  I discussed this case with my attending physician Dr. Marland Kitchen who cosigned this note including patient's presenting symptoms, physical exam, and planned diagnostics and  interventions. Attending physician stated agreement with plan or made changes to plan which were implemented.    Final Clinical Impression(s) / ED Diagnoses Final diagnoses:  None    Rx / DC Orders ED Discharge Orders     None

## 2022-07-17 NOTE — Discharge Instructions (Addendum)
You were seen in the emergency department today for your blood pressure while the emergency department had our family medicine colleagues evaluate you.  You are suitable for discharge please follow the below instructions and follow-up with your primary care doctor if you begin experiencing worsening numbness headache loss of consciousness please return to the emergency department as soon as possible.  -Increase valsartan to 320 mg daily -Continue hydralazine 10 mg 3 times daily -Follow-up with PCP on Monday morning for blood pressure check and BMP to monitor electrolytes and creatinine  Sincerely, Clydie Braun , PGY-2

## 2022-07-17 NOTE — Consult Note (Addendum)
Consult note Service Pager: [phone]  Patient name: Tina Hansen Medical record number: [id number] Date of Birth: 1941-06-13 Age: 82 y.o. Gender: female  Primary Care Provider: Pcp, No  Chief Complaint: Hyperglycemia and elevated blood pressure  Assessment and Plan: Tina Hansen is a 82 y.o. female presenting with hyperglycemia and elevated blood pressures with known uncontrolled T2DM and uncontrolled HTN.  Family medicine team asked to consult on patient for admission.  Uncontrolled hypertension Patient has no signs or symptoms concerning for hypertensive emergency at this time and does not require admission.  Creatinine is slightly elevated but she does not have an AKI. I am not worried about kidney function.  EKG is without signs of MI and troponin is WNL.  Oxygen saturation is normal she is breathing comfortably.  Neuro exam is very reassuring along with head imaging which was reviewed by neurologist at the request of ED provider.  Patient is alert and oriented and does not appear to be encephalopathic.  -Discharge from ED with the following plans: -Increase valsartan to 320 mg daily -Continue hydralazine 10 mg 3 times daily -Follow-up with PCP on Monday morning for blood pressure check and BMP to monitor electrolytes and creatinine -ED return precautions given including SBP >220 and/or DBP >120, and/or headache, confusion, vision changes, chest pain, SOB, increased dizziness, feeling over all unwell with elevated blood pressure.   Uncontrolled T2DM Patient is hyperglycemic without signs or symptoms of DKA, she is not acidotic and bicarb is WNL.  -Continue Ozempic -Follow-up with PCP on Monday morning to address hyperglycemia   History of Present Illness:  Tina Hansen is a 82 y.o. female presenting with hyperglycemia and uncontrolled hypertension. Pt states blood sugars have been high (300-400's) for past 2-3 weeks. She takes Ozempic, recently increased to 1 mg.  Home Bps ranging from 150's-low 200's/70's-low 100's. States she has been more forgetful lately, can't remember where she put things or why she went into a room but doesn't feel confused. Admits to dizziness on occasion but contributes this to vertigo although the feeling has increased in frequency over the past few weeks. Admits to SOB but this is her baseline d/t emphysema. Denies abdominal pain, back pain, BM issues, dysuria, increased BLE, vomiting, nausea, chest pain, vision changes.   Was seen at [facility] on 07/14/2022 and started on hydralazine 10 mg TID in addition to valsartan 160 mg which she has been taking for years.    Objective: BP (!) 176/95   Pulse 95   Temp 97.9 F (36.6 C)   Resp 14   SpO2 100%  Exam: General: Well-appearing 83 year old female, pleasant to speak with Eyes: White sclera, clear conjunctiva ENTM: MMM Cardiovascular: RRR, normal S1/S2 Respiratory: CTAB, normal effort Gastrointestinal: Bowel sounds present, soft, nontender palpation, nondistended MSK: Good bulk and tone Derm: Warm and dry Neuro: Cranial nerves II through XII intact, sensation intact, finger-nose-finger test and heel-to-shin test normal, normal gait Psych: Mood and affect appropriate for situation  Labs:  CBC BMET  Recent Labs  Lab 07/17/22 0317 07/17/22 0501  WBC 6.2  --   HGB 15.4* 15.3*  HCT 45.9 45.0  PLT 217  --    Recent Labs  Lab 07/17/22 0317 07/17/22 0501  NA 133* 134*  K 4.6 4.6  CL 99  --   CO2 23  --   BUN 19  --   CREATININE 1.26*  --   GLUCOSE 328*  --   CALCIUM 9.9  --  Precious Gilding, DO 07/17/2022, 3:01 PM PGY-2, [place] Intern pager: [phone], text pages welcome Secure chat group [place]

## 2022-07-17 NOTE — ED Triage Notes (Signed)
Patient reports uncontrolled hypertension and hyperglycemia for several weeks , BP= 222/136 and CBG=354 this evening . Recent changes in her antihypertensive medication , she adds feeling dizzy/lightheaded.

## 2022-07-17 NOTE — Inpatient Diabetes Management (Signed)
Inpatient Diabetes Program Recommendations  AACE/ADA: New Consensus Statement on Inpatient Glycemic Control (2015)  Target Ranges:  Prepandial:   less than 140 mg/dL      Peak postprandial:   less than 180 mg/dL (1-2 hours)      Critically ill patients:  140 - 180 mg/dL    Latest Reference Range & Units 05/04/22 14:50  Hemoglobin A1C 4.0 - 5.6 % 6.1    Latest Reference Range & Units 07/17/22 03:28 07/17/22 07:21  Glucose-Capillary 70 - 99 mg/dL 311 (H) 271 (H)  (H): Data is abnormally high    To ED with HTN Issues and Hyperglycemia (pt was in the ED a few days ago w/ the same issues)  History of DM  Home: Ozempic 1 mg Qweek (just increased a few days ago--Was taking 0.5 mg Qweek)    Lab Glu 328 at 3am today (AG 11/ CO2 23).  Pt was given 1L NS IVF bolus  Recent A1c was 6.1% (Nov 2023 @ PCP appt--PCP is [facility] at [facility] 0.5 mg Veva Holes was restarted in Nov 2023)    Addendum 12:45pm--Met w/ pt down in the ED.  Pt told me she was taking Glipizide 2.5 mg Daily several months ago.  Told me her PCP stopped the Glipizide and restarted her Ozempic at 0.5 mg Qweek back in November.  Has been checking CBGs 1-2 times per day (showed me her paper log) and CBGs have been 250-350.  Had some minor dietary indiscretions during Christmas (was eating a few cookies everyday) but has stopped eating the cookies after New Years.  Unsure why CBGs have been so elevated at home?  Pt told me she took 0.5 mg Qzempic last Friday and then took the increased dose of 1 mg on Tuesday of this week.  We reviewed her last A1c of 6.1% in November--Recommended to pt that she have repeat A1c at her PCP office if the MD in the hospital does not check one.  Pt has follow up appt with her PCP Feb 13th but told me they kept telling her to come to the ED since they can't see her until then.  We reviewed healthy CBGs for home.  Discussed with pt that the ED MD may want to adjust or add her Glipizide back if  she goes home later today.  Asked pt to please continue to check CBGs at home and let her PCP know if they do not come down lower than 200.  Asked pt to stay hydrated as best she can at home when her CBGs are consistently >200.   --Will follow patient during hospitalization--  Wyn Quaker RN, MSN, [place] Diabetes Coordinator Inpatient Glycemic Control Team Team Pager: [phone] (8a-5p)

## 2022-07-20 NOTE — Progress Notes (Signed)
Established patient visit   Patient: Tina Hansen   DOB: Nov 15, 1940   82 y.o. Female  MRN: [id number] Visit Date: 07/21/2022   Chief Complaint  Patient presents with   Medication Problem   Follow-up   Subjective    HPI  Follow up  -hypertensive emergency  -increase valsartan to 320 mg daily  -states that she has not taken it today. Generally takes it in the afternoon  -hydralazine up to three times daily.  --initial dose hydralazine taken at 8 am today  -MRI at time of visit negative for acute stroke.  --states that she has been having increased episodes of vertigo.  -related to elevated blood pressure and blood sugars  --elevaed blood sugars became a problem since the beginning of January.  Hyperglycemia in ER  --most recent HgbA1c done 05/04/2022 and was 6.1   Patient has been monitoring her blood pressure at home consistently since visit to ER  1/26  170/112    BS  390 1/27 133/93  BS 353 1/28 161/102 BS 332 1/29 194/110 BS 361  She has increased her Valsartan to 320 mg daily and taking Hydralazine 10 mg three times daily  The only medication for blood sugar she is taking os Ozempic 1 mg weekly. She is due to take it today. States that she stopped taking glipizide when she started on Ozempic. This was doing well at first. Now, blood sugars are consistently over 300.   Medications: Outpatient Medications Prior to Visit  Medication Sig   albuterol (PROVENTIL) (2.5 MG/3ML) 0.083% nebulizer solution USE ONE VIAL IN NEBULIZER EVERY 6 HOURS AS NEEDED   albuterol (VENTOLIN HFA) 108 (90 Base) MCG/ACT inhaler INHALE 1 TO 2 PUFFS BY MOUTH EVERY 6 HOURS AS NEEDED FOR WHEEZING AND FOR SHORTNESS OF BREATH   Ascorbic Acid (VITAMIN C PO) Take by mouth daily.   Biotin 5000 MCG CAPS Take 10,000 mcg by mouth daily.   calcium gluconate 500 MG tablet Take 500 mg by mouth daily.   Cholecalciferol (VITAMIN D3) 1000 units CAPS Take 1 capsule by mouth daily.    Chromium 1 MG CAPS Take 1  mg by mouth daily. Increase to 2 caps if needed   Coenzyme Q10 (COQ-10) 100 MG CAPS Take 1 capsule by mouth daily.   ECHINACEA PO Take by mouth daily.   fish oil-omega-3 fatty acids 1000 MG capsule Take 2 capsules by mouth daily.   leflunomide (ARAVA) 20 MG tablet TAKE 1/2 TABLET EVERY OTHER DAY ALTERNATING WITH 1 TABLET EVERY OTHER DAY   LUTEIN PO Take by mouth daily.   Methylsulfonylmethane (MSM PO) Take by mouth daily.   Milk Thistle 200 MG CAPS Take 400 mg by mouth daily.   Multiple Vitamin (MULTIVITAMIN) capsule Take 1 capsule by mouth daily.   NON FORMULARY Take 1 capsule by mouth daily. Collagen capsule   Semaglutide, 1 MG/DOSE, (OZEMPIC, 1 MG/DOSE,) 4 MG/3ML SOPN Inject 1 mg into the skin once a week.   TRELEGY ELLIPTA 100-62.5-25 MCG/ACT AEPB INHALE 1 PUFF INTO LUNGS ONCE DAILY   vitamin E 180 MG (400 UNITS) capsule Take 400 Units by mouth daily.   [DISCONTINUED] hydrALAZINE (APRESOLINE) 10 MG tablet Take 1 tablet (10 mg total) by mouth 3 (three) times daily.   [DISCONTINUED] valsartan (DIOVAN) 160 MG tablet Take 1 tablet (160 mg total) by mouth daily.   [DISCONTINUED] glipiZIDE (GLUCOTROL) 5 MG tablet TAKE 1/2 (ONE-HALF) TABLET BY MOUTH ONCE DAILY BEFORE SUPPER (Patient not taking: Reported on 06/01/2022)  No facility-administered medications prior to visit.    Review of Systems  Constitutional:  Positive for fatigue. Negative for activity change, appetite change, chills and fever.  HENT:  Negative for congestion, postnasal drip, rhinorrhea, sinus pressure, sinus pain, sneezing and sore throat.   Eyes: Negative.   Respiratory:  Negative for cough, chest tightness, shortness of breath and wheezing.   Cardiovascular:  Negative for chest pain and palpitations.       Moderate blood pressure elevation today   Gastrointestinal:  Negative for abdominal pain, constipation, diarrhea, nausea and vomiting.  Endocrine: Negative for cold intolerance, heat intolerance, polydipsia and  polyuria.       Blood sugars running in the high 300s   Genitourinary:  Negative for dyspareunia, dysuria, flank pain, frequency and urgency.  Musculoskeletal:  Negative for arthralgias, back pain and myalgias.  Skin:  Negative for rash.  Allergic/Immunologic: Negative for environmental allergies.  Neurological:  Positive for dizziness and headaches. Negative for weakness.  Hematological:  Negative for adenopathy.  Psychiatric/Behavioral:  The patient is not nervous/anxious.     Last CBC Lab Results  Component Value Date   WBC 6.2 07/17/2022   HGB 15.3 (H) 07/17/2022   HCT 45.0 07/17/2022   MCV 88.1 07/17/2022   MCH 29.6 07/17/2022   RDW 13.3 07/17/2022   PLT 217 34/19/6222   Last metabolic panel Lab Results  Component Value Date   GLUCOSE 328 (H) 07/17/2022   NA 134 (L) 07/17/2022   K 4.6 07/17/2022   CL 99 07/17/2022   CO2 23 07/17/2022   BUN 19 07/17/2022   CREATININE 1.26 (H) 07/17/2022   GFRNONAA 43 (L) 07/17/2022   CALCIUM 9.9 07/17/2022   PROT 6.9 07/17/2022   ALBUMIN 3.9 07/17/2022   LABGLOB 2.5 05/01/2021   AGRATIO 1.5 05/01/2021   BILITOT 0.6 07/17/2022   ALKPHOS 59 07/17/2022   AST 29 07/17/2022   ALT 27 07/17/2022   ANIONGAP 11 07/17/2022   Last lipids Lab Results  Component Value Date   CHOL 234 (H) 03/25/2022   HDL 69 03/25/2022   LDLCALC 139 (H) 03/25/2022   TRIG 140 03/25/2022   CHOLHDL 3.4 03/25/2022   Last hemoglobin A1c Lab Results  Component Value Date   HGBA1C 6.1 05/04/2022   Last thyroid functions Lab Results  Component Value Date   TSH 4.560 (H) 03/20/2019   T3TOTAL 149 03/20/2019   Last vitamin D Lab Results  Component Value Date   VD25OH 54 11/04/2017       Objective     Today's Vitals   07/21/22 0829 07/21/22 0858  BP: (Abnormal) 174/87 123/69  Pulse: 96   SpO2: 94%   Weight: 227 lb 6.4 oz (103.1 kg)   Height: '5\' 7"'$  (1.702 m)    Body mass index is 35.62 kg/m.  BP Readings from Last 3 Encounters:  07/21/22  123/69  07/17/22 (Abnormal) 183/131  07/14/22 135/88    Wt Readings from Last 3 Encounters:  07/21/22 227 lb 6.4 oz (103.1 kg)  06/01/22 236 lb 9.6 oz (107.3 kg)  05/04/22 244 lb (110.7 kg)    Physical Exam Vitals and nursing note reviewed.  Constitutional:      Appearance: Normal appearance. She is well-developed.  HENT:     Head: Normocephalic and atraumatic.     Nose: Nose normal.     Mouth/Throat:     Mouth: Mucous membranes are moist.     Pharynx: Oropharynx is clear.  Eyes:     Extraocular Movements:  Extraocular movements intact.     Conjunctiva/sclera: Conjunctivae normal.     Pupils: Pupils are equal, round, and reactive to light.  Cardiovascular:     Rate and Rhythm: Normal rate and regular rhythm.     Pulses: Normal pulses.     Heart sounds: Normal heart sounds.  Pulmonary:     Effort: Pulmonary effort is normal.     Breath sounds: Normal breath sounds.  Abdominal:     Palpations: Abdomen is soft.  Musculoskeletal:        General: Normal range of motion.     Cervical back: Normal range of motion and neck supple.  Lymphadenopathy:     Cervical: No cervical adenopathy.  Skin:    General: Skin is warm and dry.     Capillary Refill: Capillary refill takes less than 2 seconds.  Neurological:     General: No focal deficit present.     Mental Status: She is alert and oriented to person, place, and time.  Psychiatric:        Mood and Affect: Mood normal.        Behavior: Behavior normal.        Thought Content: Thought content normal.        Judgment: Judgment normal.      Assessment & Plan    1. Essential hypertension Patient has already increased valsartan to 320 mg daily. Increase dose hydralazine to 25 mg. Continue up to three times daily. Advised patient to limit intake of salt and increase water in the diet. She should monitor her blood pressure at home. She understands that the goal is to have her blood pressure be at or below 140/80.  - hydrALAZINE  (APRESOLINE) 25 MG tablet; Take 1 tablet (25 mg total) by mouth 3 (three) times daily.  Dispense: 90 tablet; Refill: 1  2. Uncontrolled type 2 diabetes mellitus with hyperglycemia (HCC) Add back glipizide 5 mg daily. Continue ozempic 1 mg weekly. Monitor blood sugar closely. The ultimate goal is to have blood sugar between 70 and 120.  - glipiZIDE (GLUCOTROL) 5 MG tablet; Take 1 tablet po QD  Dispense: 90 tablet; Refill: 1  3. COPD mixed type ([place]) Continue inhalers as prescribed    Problem List Items Addressed This Visit       Cardiovascular and Mediastinum   Essential hypertension - Primary   Relevant Medications   hydrALAZINE (APRESOLINE) 25 MG tablet   valsartan (DIOVAN) 160 MG tablet     Respiratory   COPD mixed type (HCC)     Endocrine   Uncontrolled type 2 diabetes mellitus with hyperglycemia (HCC)   Relevant Medications   glipiZIDE (GLUCOTROL) 5 MG tablet   valsartan (DIOVAN) 160 MG tablet     Other   Hyperlipidemia   Relevant Medications   hydrALAZINE (APRESOLINE) 25 MG tablet   valsartan (DIOVAN) 160 MG tablet     Return in about 1 week (around 07/28/2022) for blood pressure, blood sugar .         Ronnell Freshwater, NP  [facility] at [facility] [phone] (phone) [phone] (fax)  [place]

## 2022-07-21 ENCOUNTER — Ambulatory Visit (INDEPENDENT_AMBULATORY_CARE_PROVIDER_SITE_OTHER): Payer: Medicare HMO | Admitting: Nurse Practitioner

## 2022-07-21 ENCOUNTER — Encounter: Payer: Self-pay | Admitting: Nurse Practitioner

## 2022-07-21 VITALS — BP 123/69 | HR 96 | Ht 67.0 in | Wt 227.4 lb

## 2022-07-21 DIAGNOSIS — E1165 Type 2 diabetes mellitus with hyperglycemia: Secondary | ICD-10-CM | POA: Diagnosis not present

## 2022-07-21 DIAGNOSIS — I10 Essential (primary) hypertension: Secondary | ICD-10-CM | POA: Diagnosis not present

## 2022-07-21 DIAGNOSIS — E785 Hyperlipidemia, unspecified: Secondary | ICD-10-CM

## 2022-07-21 DIAGNOSIS — E119 Type 2 diabetes mellitus without complications: Secondary | ICD-10-CM

## 2022-07-21 DIAGNOSIS — J449 Chronic obstructive pulmonary disease, unspecified: Secondary | ICD-10-CM

## 2022-07-21 MED ORDER — GLIPIZIDE 5 MG PO TABS: ORAL_TABLET | ORAL | 1 refills | Status: DC

## 2022-07-21 MED ORDER — HYDRALAZINE HCL 25 MG PO TABS: 25.0000 mg | ORAL_TABLET | Freq: Three times a day (TID) | ORAL | 1 refills | Status: DC

## 2022-07-28 MED ORDER — VALSARTAN 160 MG PO TABS: 320.0000 mg | ORAL_TABLET | Freq: Every day | ORAL | 1 refills | Status: DC

## 2022-07-28 MED ORDER — GLIPIZIDE 5 MG PO TABS: ORAL_TABLET | ORAL | 1 refills | Status: DC

## 2022-07-31 ENCOUNTER — Encounter: Payer: Self-pay | Admitting: Nurse Practitioner

## 2022-07-31 ENCOUNTER — Ambulatory Visit (INDEPENDENT_AMBULATORY_CARE_PROVIDER_SITE_OTHER): Payer: Medicare HMO | Admitting: Nurse Practitioner

## 2022-07-31 VITALS — BP 131/87 | HR 105 | Ht 67.0 in | Wt 229.0 lb

## 2022-07-31 DIAGNOSIS — E1165 Type 2 diabetes mellitus with hyperglycemia: Secondary | ICD-10-CM

## 2022-07-31 DIAGNOSIS — E1159 Type 2 diabetes mellitus with other circulatory complications: Secondary | ICD-10-CM

## 2022-07-31 DIAGNOSIS — E785 Hyperlipidemia, unspecified: Secondary | ICD-10-CM | POA: Diagnosis not present

## 2022-07-31 DIAGNOSIS — I10 Essential (primary) hypertension: Secondary | ICD-10-CM | POA: Diagnosis not present

## 2022-07-31 DIAGNOSIS — R5383 Other fatigue: Secondary | ICD-10-CM

## 2022-07-31 DIAGNOSIS — I152 Hypertension secondary to endocrine disorders: Secondary | ICD-10-CM

## 2022-07-31 DIAGNOSIS — E559 Vitamin D deficiency, unspecified: Secondary | ICD-10-CM

## 2022-07-31 LAB — POCT UA - MICROALBUMIN
Creatinine, POC: 300 mg/dL
Microalbumin Ur, POC: 80 mg/L

## 2022-07-31 NOTE — Progress Notes (Signed)
Established patient visit   Patient: Tina Hansen   DOB: [birth year]   82 y.o. Female  MRN: [id number] Visit Date: 07/31/2022   Chief Complaint  Patient presents with   Follow-up   Hypertension   Subjective    HPI  Follow up  -hypertension -- increased valsartan to 320 mg daily  --increased hydralazine to 25 mg three time  daily  --blood pressure improved today  -DM2 --added back glipizide 5 mg daily  --will check labs to check efficacy  -she has no new concerns or complaints today     Medications: Outpatient Medications Prior to Visit  Medication Sig   albuterol (PROVENTIL) (2.5 MG/3ML) 0.083% nebulizer solution USE ONE VIAL IN NEBULIZER EVERY 6 HOURS AS NEEDED   albuterol (VENTOLIN HFA) 108 (90 Base) MCG/ACT inhaler INHALE 1 TO 2 PUFFS BY MOUTH EVERY 6 HOURS AS NEEDED FOR WHEEZING AND FOR SHORTNESS OF BREATH   Ascorbic Acid (VITAMIN C PO) Take by mouth daily.   Biotin 5000 MCG CAPS Take 10,000 mcg by mouth daily.   calcium gluconate 500 MG tablet Take 500 mg by mouth daily.   Cholecalciferol (VITAMIN D3) 1000 units CAPS Take 1 capsule by mouth daily.    Chromium 1 MG CAPS Take 1 mg by mouth daily. Increase to 2 caps if needed   Coenzyme Q10 (COQ-10) 100 MG CAPS Take 1 capsule by mouth daily.   ECHINACEA PO Take by mouth daily.   fish oil-omega-3 fatty acids 1000 MG capsule Take 2 capsules by mouth daily.   glipiZIDE (GLUCOTROL) 5 MG tablet Take 1 tablet po QD   hydrALAZINE (APRESOLINE) 25 MG tablet Take 1 tablet (25 mg total) by mouth 3 (three) times daily.   LUTEIN PO Take by mouth daily.   Methylsulfonylmethane (MSM PO) Take by mouth daily.   Milk Thistle 200 MG CAPS Take 400 mg by mouth daily.   Multiple Vitamin (MULTIVITAMIN) capsule Take 1 capsule by mouth daily.   NON FORMULARY Take 1 capsule by mouth daily. Collagen capsule   Semaglutide, 1 MG/DOSE, (OZEMPIC, 1 MG/DOSE,) 4 MG/3ML SOPN Inject 1 mg into the skin once a week.   valsartan (DIOVAN) 160 MG  tablet Take 2 tablets (320 mg total) by mouth daily.   vitamin E 180 MG (400 UNITS) capsule Take 400 Units by mouth daily.   [DISCONTINUED] leflunomide (ARAVA) 20 MG tablet TAKE 1/2 TABLET EVERY OTHER DAY ALTERNATING WITH 1 TABLET EVERY OTHER DAY   [DISCONTINUED] TRELEGY ELLIPTA 100-62.5-25 MCG/ACT AEPB INHALE 1 PUFF INTO LUNGS ONCE DAILY   No facility-administered medications prior to visit.    Review of Systems See HPI     Objective     Today's Vitals   07/31/22 1105 07/31/22 1117  BP: (Abnormal) 164/79 131/87  Pulse: (Abnormal) 105 (Abnormal) 105  Weight: 229 lb (103.9 kg)   Height: 5\' 7"  (1.702 m)    Body mass index is 35.87 kg/m.  BP Readings from Last 3 Encounters:  09/08/22 138/62  07/31/22 131/87  07/21/22 123/69    Wt Readings from Last 3 Encounters:  09/08/22 236 lb 9.6 oz (107.3 kg)  07/31/22 229 lb (103.9 kg)  07/21/22 227 lb 6.4 oz (103.1 kg)    Physical Exam Vitals and nursing note reviewed.  Constitutional:      Appearance: Normal appearance. She is well-developed.  HENT:     Head: Normocephalic and atraumatic.     Nose: Nose normal.     Mouth/Throat:     Mouth: Mucous  membranes are moist.     Pharynx: Oropharynx is clear.  Eyes:     Extraocular Movements: Extraocular movements intact.     Conjunctiva/sclera: Conjunctivae normal.     Pupils: Pupils are equal, round, and reactive to light.  Cardiovascular:     Rate and Rhythm: Normal rate and regular rhythm.     Pulses: Normal pulses.     Heart sounds: Normal heart sounds.  Pulmonary:     Effort: Pulmonary effort is normal.     Breath sounds: Normal breath sounds.  Abdominal:     Palpations: Abdomen is soft.  Musculoskeletal:        General: Normal range of motion.     Cervical back: Normal range of motion and neck supple.  Lymphadenopathy:     Cervical: No cervical adenopathy.  Skin:    General: Skin is warm and dry.     Capillary Refill: Capillary refill takes less than 2 seconds.   Neurological:     General: No focal deficit present.     Mental Status: She is alert and oriented to person, place, and time.  Psychiatric:        Mood and Affect: Mood normal.        Behavior: Behavior normal.        Thought Content: Thought content normal.        Judgment: Judgment normal.     Results for orders placed or performed in visit on 07/31/22  CBC  Result Value Ref Range   WBC 6.8 3.4 - 10.8 x10E3/uL   RBC 5.11 3.77 - 5.28 x10E6/uL   Hemoglobin 14.9 11.1 - 15.9 g/dL   Hematocrit 45.2 34.0 - 46.6 %   MCV 89 79 - 97 fL   MCH 29.2 26.6 - 33.0 pg   MCHC 33.0 31.5 - 35.7 g/dL   RDW 13.1 11.7 - 15.4 %   Platelets 251 150 - 450 x10E3/uL  Comprehensive metabolic panel  Result Value Ref Range   Glucose 173 (H) 70 - 99 mg/dL   BUN 23 8 - 27 mg/dL   Creatinine, Ser 1.06 (H) 0.57 - 1.00 mg/dL   eGFR 53 (L) >59 mL/min/1.73   BUN/Creatinine Ratio 22 12 - 28   Sodium 138 134 - 144 mmol/L   Potassium 4.7 3.5 - 5.2 mmol/L   Chloride 102 96 - 106 mmol/L   CO2 20 20 - 29 mmol/L   Calcium 9.6 8.7 - 10.3 mg/dL   Total Protein 6.7 6.0 - 8.5 g/dL   Albumin 4.4 3.7 - 4.7 g/dL   Globulin, Total 2.3 1.5 - 4.5 g/dL   Albumin/Globulin Ratio 1.9 1.2 - 2.2   Bilirubin Total 0.4 0.0 - 1.2 mg/dL   Alkaline Phosphatase 63 44 - 121 IU/L   AST 28 0 - 40 IU/L   ALT 27 0 - 32 IU/L  Lipid panel  Result Value Ref Range   Cholesterol, Total 257 (H) 100 - 199 mg/dL   Triglycerides 144 0 - 149 mg/dL   HDL 75 >39 mg/dL   VLDL Cholesterol Cal 25 5 - 40 mg/dL   LDL Chol Calc (NIH) 157 (H) 0 - 99 mg/dL   Chol/HDL Ratio 3.4 0.0 - 4.4 ratio  Hemoglobin A1c  Result Value Ref Range   Hgb A1c MFr Bld 10.0 (H) 4.8 - 5.6 %   Est. average glucose Bld gHb Est-mCnc 240 mg/dL  VITAMIN D 25 Hydroxy (Vit-D Deficiency, Fractures)  Result Value Ref Range   Vit D, 25-Hydroxy  70.6 30.0 - 100.0 ng/mL  TSH + free T4  Result Value Ref Range   TSH 1.920 0.450 - 4.500 uIU/mL   Free T4 1.37 0.82 - 1.77 ng/dL   POCT UA - Microalbumin  Result Value Ref Range   Microalbumin Ur, POC 80 mg/L   Creatinine, POC 300 mg/dL   Albumin/Creatinine Ratio, Urine, POC 30-300     Assessment & Plan    1. Uncontrolled type 2 diabetes mellitus with hyperglycemia (HCC) Routine, fasting labs drawn during today's visit. Urine microalbumin slightly abnormal today. Adjust medications as indicated. Reassess in three months  - POCT UA - Microalbumin - Hemoglobin A1c; Future - Comprehensive metabolic panel; Future - CBC; Future - CBC - Comprehensive metabolic panel - Hemoglobin A1c  2. Hypertension associated with type 2 diabetes mellitus ([place]) Improved. No changes in medication today. Routine, fasting labs drawn during today's visit.  - Comprehensive metabolic panel; Future - CBC; Future - CBC - Comprehensive metabolic panel  3. Hyperlipidemia, unspecified hyperlipidemia type Routine, fasting labs drawn during today's visit. Treat as indicated  - Lipid panel; Future - Comprehensive metabolic panel; Future - CBC; Future - CBC - Comprehensive metabolic panel - Lipid panel  4. Other fatigue Check thyroid panel with today's labs  - TSH + free T4; Future - TSH + free T4  5. Vitamin D deficiency Check vitamin d level and treat deficiency as indicated.   - VITAMIN D 25 Hydroxy (Vit-D Deficiency, Fractures); Future - VITAMIN D 25 Hydroxy (Vit-D Deficiency, Fractures)    Problem List Items Addressed This Visit       Cardiovascular and Mediastinum   Essential hypertension     Endocrine   Uncontrolled type 2 diabetes mellitus with hyperglycemia ([place]) - Primary   Relevant Orders   POCT UA - Microalbumin (Completed)   Hemoglobin A1c (Completed)   Comprehensive metabolic panel (Completed)   CBC (Completed)     Other   Hyperlipidemia   Relevant Orders   Lipid panel (Completed)   Comprehensive metabolic panel (Completed)   CBC (Completed)   Fatigue   Relevant Orders   TSH + free T4 (Completed)    Vitamin D deficiency   Relevant Orders   VITAMIN D 25 Hydroxy (Vit-D Deficiency, Fractures) (Completed)     Return in about 3 months (around 10/29/2022) for diabetes with HgbA1c check - please d/c appt 2/13. she can schedule 3 mos with me. Marland Kitchen         Ronnell Freshwater, NP  [facility] at [facility] [phone] (phone) [phone] (fax)  [place]

## 2022-08-01 LAB — CBC
Hematocrit: 45.2 % (ref 34.0–46.6)
Hemoglobin: 14.9 g/dL (ref 11.1–15.9)
MCH: 29.2 pg (ref 26.6–33.0)
MCHC: 33 g/dL (ref 31.5–35.7)
MCV: 89 fL (ref 79–97)
Platelets: 251 10*3/uL (ref 150–450)
RBC: 5.11 x10E6/uL (ref 3.77–5.28)
RDW: 13.1 % (ref 11.7–15.4)
WBC: 6.8 10*3/uL (ref 3.4–10.8)

## 2022-08-01 LAB — COMPREHENSIVE METABOLIC PANEL
ALT: 27 IU/L (ref 0–32)
AST: 28 IU/L (ref 0–40)
Albumin/Globulin Ratio: 1.9 (ref 1.2–2.2)
Albumin: 4.4 g/dL (ref 3.7–4.7)
Alkaline Phosphatase: 63 IU/L (ref 44–121)
BUN/Creatinine Ratio: 22 (ref 12–28)
BUN: 23 mg/dL (ref 8–27)
Bilirubin Total: 0.4 mg/dL (ref 0.0–1.2)
CO2: 20 mmol/L (ref 20–29)
Calcium: 9.6 mg/dL (ref 8.7–10.3)
Chloride: 102 mmol/L (ref 96–106)
Creatinine, Ser: 1.06 mg/dL — ABNORMAL HIGH (ref 0.57–1.00)
Globulin, Total: 2.3 g/dL (ref 1.5–4.5)
Glucose: 173 mg/dL — ABNORMAL HIGH (ref 70–99)
Potassium: 4.7 mmol/L (ref 3.5–5.2)
Sodium: 138 mmol/L (ref 134–144)
Total Protein: 6.7 g/dL (ref 6.0–8.5)
eGFR: 53 mL/min/{1.73_m2} — ABNORMAL LOW (ref >59)

## 2022-08-01 LAB — LIPID PANEL
Chol/HDL Ratio: 3.4 ratio (ref 0.0–4.4)
Cholesterol, Total: 257 mg/dL — ABNORMAL HIGH (ref 100–199)
HDL: 75 mg/dL (ref >39)
LDL Chol Calc (NIH): 157 mg/dL — ABNORMAL HIGH (ref 0–99)
Triglycerides: 144 mg/dL (ref 0–149)
VLDL Cholesterol Cal: 25 mg/dL (ref 5–40)

## 2022-08-01 LAB — VITAMIN D 25 HYDROXY (VIT D DEFICIENCY, FRACTURES): Vit D, 25-Hydroxy: 70.6 ng/mL (ref 30.0–100.0)

## 2022-08-01 LAB — TSH+FREE T4
Free T4: 1.37 ng/dL (ref 0.82–1.77)
TSH: 1.92 u[IU]/mL (ref 0.450–4.500)

## 2022-08-01 LAB — HEMOGLOBIN A1C
Est. average glucose Bld gHb Est-mCnc: 240 mg/dL
Hgb A1c MFr Bld: 10 % — ABNORMAL HIGH (ref 4.8–5.6)

## 2022-08-04 ENCOUNTER — Ambulatory Visit: Payer: Medicare HMO | Admitting: Family Medicine

## 2022-08-11 ENCOUNTER — Other Ambulatory Visit: Payer: Self-pay | Admitting: Pulmonary Disease

## 2022-08-13 ENCOUNTER — Telehealth: Payer: Self-pay | Admitting: Pulmonary Disease

## 2022-08-13 ENCOUNTER — Other Ambulatory Visit: Payer: Self-pay | Admitting: Pulmonary Disease

## 2022-08-13 NOTE — Telephone Encounter (Signed)
Pt calling. Running out of TRELEGY ELLIPTA 100-62.5-25 MCG/ACT AEPB. [facility] put 2 orders thru. Nothing sent, she said. Pls call PT to advise.   [phone] is PT #

## 2022-08-13 NOTE — Telephone Encounter (Signed)
PT has not been seen since 2022 but she should not need appt because she has several tests in already with other providers so Dr. Should see she is OK to continue on [place] 100-62.5-25 MCG/ACT AEPB. Thanks!

## 2022-08-14 ENCOUNTER — Other Ambulatory Visit: Payer: Self-pay

## 2022-08-14 MED ORDER — TRELEGY ELLIPTA 100-62.5-25 MCG/ACT IN AEPB: INHALATION_SPRAY | RESPIRATORY_TRACT | 2 refills | Status: DC

## 2022-08-14 NOTE — Telephone Encounter (Signed)
Spoke with patient refilled Trelegy until her next appointment with Dr. [name]. Advised patient she was over due for a office visit. Last seen in 2022. Nothing further needed at this time

## 2022-08-24 ENCOUNTER — Other Ambulatory Visit: Payer: Self-pay | Admitting: Physician Assistant

## 2022-08-24 NOTE — Telephone Encounter (Signed)
Next Visit: 11/10/2022  Last Visit: 06/01/2022  Last Fill: 05/21/2022  DX: Seropositive rheumatoid arthritis of multiple sites   Current Dose per office note 06/01/2022: Arava 10 mg alternating with 20 mg every other day.   Labs: 07/31/2022 Glucose 173, Creatinine 1.06, eGRF 53,   Okay to refill Arava?

## 2022-08-27 ENCOUNTER — Telehealth: Payer: Self-pay

## 2022-08-27 NOTE — Telephone Encounter (Signed)
Patient contacted the office and stated she was in the emergency room for high blood pressure and high blood sugar. Patient stated she recently had labs drawn. Patient inquired if the labs that were drawn are recent enough to be used until her next appointment on 11/10/2022. After reviewing the chart, patient advised the labs drawn on 07/31/2022 are recent enough. Patient advised she needs labs every 3 months and will have labs drawn at her next appointment in May. Patient verbalized understanding.

## 2022-09-01 ENCOUNTER — Telehealth: Payer: Self-pay | Admitting: *Deleted

## 2022-09-01 NOTE — Telephone Encounter (Signed)
Pt calling in to say that she needs her handicap placard renewed.  Informed her that she could drop it off and if provider is available to sign then we can get them to sign, otherwise we can get it to provider and once she is able to complete we would call her to come and pick it up. Arion Shankles Zimmerman Rumple, CMA

## 2022-09-08 ENCOUNTER — Ambulatory Visit: Payer: Medicare HMO | Admitting: Pulmonary Disease

## 2022-09-08 ENCOUNTER — Encounter: Payer: Self-pay | Admitting: Pulmonary Disease

## 2022-09-08 VITALS — BP 138/62 | HR 93 | Temp 97.9°F | Ht 66.0 in | Wt 236.6 lb

## 2022-09-08 DIAGNOSIS — J449 Chronic obstructive pulmonary disease, unspecified: Secondary | ICD-10-CM | POA: Diagnosis not present

## 2022-09-08 MED ORDER — TRELEGY ELLIPTA 100-62.5-25 MCG/ACT IN AEPB: INHALATION_SPRAY | RESPIRATORY_TRACT | 5 refills | Status: DC

## 2022-09-08 NOTE — Patient Instructions (Addendum)
I am glad you are doing well with your breathing Continue Trelegy.  Will send a prescription for a year Follow-up in 1 year

## 2022-09-08 NOTE — Progress Notes (Signed)
Tina Hansen    UK:[id number]    26-Jun-1940  Primary Care Physician:Abonza, Herb Grays, PA-C (Inactive)  Referring Physician: No referring provider defined for this encounter.  Chief complaint: Follow-up for COPD  HPI: 82 y.o.  with vocal cord dysfunction, obesity, COPD, rheumatoid arthritis Previously followed with Dr. [name].  Maintained on Symbicort for several years with stable symptoms Has chronic dyspnea on exertion.  Denies any cough, sputum production  Follows with Dr. [name] for seronegative rheumatoid arthritis Methotrexate stopped and she was changed to [facility] by rheumatology in August 2021.  She is tolerating it well so far Symbicort changed to Trelegy in 2022 with improvement in symptoms  Pets: No pets Occupation: Retired Geophysicist/field seismologist Exposures: No known exposures, no mold, hot tub, Jacuzzi Smoking history: 30-pack-year smoker.  Quit in 1987 Travel history: No significant travel history Relevant family history: No significant family history of lung disease  Interval history: Continues on Trelegy with no issues States that breathing is doing well  She had a ED visit in January 2024 for uncontrolled hypertension, hyperglycemia, balance issues.  She had CT and MRI of the brain.  Chest x-ray at that time did not show any acute lung abnormality.  Outpatient Encounter Medications as of 09/08/2022  Medication Sig   albuterol (PROVENTIL) (2.5 MG/3ML) 0.083% nebulizer solution USE ONE VIAL IN NEBULIZER EVERY 6 HOURS AS NEEDED   albuterol (VENTOLIN HFA) 108 (90 Base) MCG/ACT inhaler INHALE 1 TO 2 PUFFS BY MOUTH EVERY 6 HOURS AS NEEDED FOR WHEEZING AND FOR SHORTNESS OF BREATH   Ascorbic Acid (VITAMIN C PO) Take by mouth daily.   Biotin 5000 MCG CAPS Take 10,000 mcg by mouth daily.   calcium gluconate 500 MG tablet Take 500 mg by mouth daily.   Cholecalciferol (VITAMIN D3) 1000 units CAPS Take 1 capsule by mouth daily.    Chromium 1 MG CAPS Take 1 mg by mouth  daily. Increase to 2 caps if needed   Coenzyme Q10 (COQ-10) 100 MG CAPS Take 1 capsule by mouth daily.   ECHINACEA PO Take by mouth daily.   fish oil-omega-3 fatty acids 1000 MG capsule Take 2 capsules by mouth daily.   Fluticasone-Umeclidin-Vilant (TRELEGY ELLIPTA) 100-62.5-25 MCG/ACT AEPB INHALE 1 PUFF INTO LUNGS ONCE DAILY   glipiZIDE (GLUCOTROL) 5 MG tablet Take 1 tablet po QD   hydrALAZINE (APRESOLINE) 25 MG tablet Take 1 tablet (25 mg total) by mouth 3 (three) times daily.   leflunomide (ARAVA) 20 MG tablet TAKE 1/2 (ONE-HALF) TABLET BY MOUTH EVERY OTHER DAY ALTERNATING  WITH  1  TABLET  EVERY  OTHER  DAY   LUTEIN PO Take by mouth daily.   Methylsulfonylmethane (MSM PO) Take by mouth daily.   Milk Thistle 200 MG CAPS Take 400 mg by mouth daily.   Multiple Vitamin (MULTIVITAMIN) capsule Take 1 capsule by mouth daily.   NON FORMULARY Take 1 capsule by mouth daily. Collagen capsule   Semaglutide, 1 MG/DOSE, (OZEMPIC, 1 MG/DOSE,) 4 MG/3ML SOPN Inject 1 mg into the skin once a week.   valsartan (DIOVAN) 160 MG tablet Take 2 tablets (320 mg total) by mouth daily.   vitamin E 180 MG (400 UNITS) capsule Take 400 Units by mouth daily.   No facility-administered encounter medications on file as of 09/08/2022.    Physical Exam: Blood pressure 138/62, pulse 93, temperature 97.9 F (36.6 C), temperature source Oral, height 5\' 6"  (1.676 m), weight 236 lb 9.6 oz (107.3 kg), SpO2 91 %.  Gen:      No acute distress HEENT:  EOMI, sclera anicteric Neck:     No masses; no thyromegaly Lungs:    Clear to auscultation bilaterally; normal respiratory effort CV:         Regular rate and rhythm; no murmurs Abd:      + bowel sounds; soft, non-tender; no palpable masses, no distension Ext:    No edema; adequate peripheral perfusion Skin:      Warm and dry; no rash Neuro: alert and oriented x 3 Psych: normal mood and affect   Data Reviewed: Imaging: Chest x-ray 06/28/2017- hyperinflated lungs.  No acute  cardiopulmonary abnormality. Chest x-ray 12/29/2018-hyperinflated lungs.  No acute abnormality Chest x-ray 03/15/2020-emphysema.  No acute cardiopulmonary abnormality. Chest x-ray 07/17/2022-emphysema, no acute cardiopulmonary abnormality. I have reviewed the images personally.  PFTs: 05/09/2015 FVC 2 [65%), FEV1 1 [43%], F/F 49 Severe obstruction  06/07/2020 FVC 2.33 [1%], FEV1 1.20 [55%], F/F 52, TLC 5.68 [101%], DLCO 13.58 [65%] Severe obstruction, moderate diffusion defect  Assessment:  Severe COPD Doing well on Trelegy Continue current therapy  Seronegative rheumatoid arthritis No evidence of interstitial lung disease in the past She would like to avoid CT scan of the chest to limit radiation exposure. PFTs did not show any restriction Chest x-ray in January did not show any interstitial abnormalities  Plan/Recommendations: - Continue trelegy  Follow-up in 1 year  Marshell Garfinkel MD [facility] 09/08/2022, 2:32 PM  CC: No ref. provider found

## 2022-09-08 NOTE — Addendum Note (Signed)
Addended by: Elton Sin on: 09/08/2022 03:45 PM   Modules accepted: Orders

## 2022-09-10 DIAGNOSIS — E559 Vitamin D deficiency, unspecified: Secondary | ICD-10-CM | POA: Insufficient documentation

## 2022-10-07 ENCOUNTER — Other Ambulatory Visit: Payer: Self-pay | Admitting: Nurse Practitioner

## 2022-10-07 DIAGNOSIS — I10 Essential (primary) hypertension: Secondary | ICD-10-CM

## 2022-10-12 ENCOUNTER — Telehealth: Payer: Self-pay

## 2022-10-12 NOTE — Telephone Encounter (Signed)
Called patient to schedule Medicare Annual Wellness Visit (AWV). Left message for patient to call back and schedule Medicare Annual Wellness Visit (AWV).  Last date of AWV: 10/29/20  Please schedule an appointment at any time on Annual Wellness Visit Schedule.

## 2022-10-27 NOTE — Progress Notes (Signed)
Office Visit Note  Patient: Tina Hansen             Date of Birth: 07-30-1940           MRN: [id number]             PCP: Mayer Masker, PA-C (Inactive) Referring: Mayer Masker, PA-C Visit Date: 11/10/2022 Occupation: @GUAROCC @  Subjective:  Medication monitoring  History of Present Illness: Tina Hansen is a 82 y.o. female with seropositive rheumatoid arthritis and osteoarthritis.  She denies any flare of rheumatoid arthritis since the last visit.  She has been taking Arava 10 mg alternating with 20 mg every other day which she has been tolerating well.  She denies any morning stiffness.  She was evaluated by Dr. Isaiah Serge for COPD.  She had PFTs in 2021 which did not show any restriction.  She was advised to continue Trelegy.  She continues to have some vertigo.  She states she slipped yesterday and landed on her right knee.  She denies any loss of consciousness.    Activities of Daily Living:  Patient reports morning stiffness for 0 minutes.   Patient Denies nocturnal pain.  Difficulty dressing/grooming: Denies Difficulty climbing stairs: Denies Difficulty getting out of chair: Denies Difficulty using hands for taps, buttons, cutlery, and/or writing: Denies  Review of Systems  Constitutional:  Negative for fatigue.  HENT:  Positive for mouth dryness. Negative for mouth sores.   Eyes:  Negative for dryness.  Respiratory:  Positive for shortness of breath.   Cardiovascular:  Positive for swelling in legs/feet. Negative for chest pain and palpitations.  Gastrointestinal:  Negative for blood in stool, constipation and diarrhea.  Endocrine: Negative for increased urination.  Genitourinary:  Negative for involuntary urination.  Musculoskeletal:  Negative for joint pain, gait problem, joint pain, joint swelling, myalgias, muscle weakness, morning stiffness, muscle tenderness and myalgias.  Skin:  Positive for hair loss. Negative for color change, rash and sensitivity to  sunlight.  Allergic/Immunologic: Negative for susceptible to infections.  Neurological:  Positive for dizziness. Negative for headaches.  Hematological:  Negative for swollen glands.  Psychiatric/Behavioral:  Positive for sleep disturbance. Negative for depressed mood. The patient is not nervous/anxious.     PMFS History:  Patient Active Problem List   Diagnosis Date Noted   Vitamin D deficiency 09/10/2022   Uncontrolled type 2 diabetes mellitus with hyperglycemia (HCC) 09/01/2021   Pain 09/25/2019   Fatigue 09/25/2019   Statin declined 08/21/2019   Non compliance with medical treatment 08/21/2019   Cardiovascular disease 03/24/2019   Elevated LDL cholesterol level 11/07/2018   Pre-diabetes 11/07/2018   BMI 40.0-44.9, adult (HCC) 08/08/2018   Onychomycosis 04/06/2018   Decreased GFR 01/05/2018   Healthcare maintenance 09/08/2017   Lipoma of left upper extremity 08/09/2017   COPD mixed type (HCC) 06/28/2017   History of COPD 06/26/2016   History of hypertension 06/26/2016   Osteopenia of multiple sites 06/26/2016   Morbid obesity (HCC) 05/04/2016   High risk medication use 04/25/2016   Vocal cord dysfunction 04/29/2015   Seropositive rheumatoid arthritis of multiple sites (HCC) 04/29/2015   Hyperlipidemia 10/06/2007   Essential hypertension 10/06/2007   OTHER DISEASES OF VOCAL CORDS 10/06/2007   Dyspnea on exertion 10/06/2007   CERVICAL CANCER, HX OF 10/06/2007    Past Medical History:  Diagnosis Date   Asthma    Cervical cancer (HCC)    Diabetes (HCC)    Emphysema lung (HCC)    Hyperlipidemia    Hypertension  Rheumatoid arthritis(714.0)    Shakora Nordquist    Family History  Problem Relation Age of Onset   Asthma Mother    Breast cancer Mother    Cancer Mother    Alcohol abuse Father    Diabetes Son    Cancer Maternal Uncle    Heart attack Maternal Uncle    Past Surgical History:  Procedure Laterality Date   ABDOMINAL HYSTERECTOMY     BREAST SURGERY     Breat  implants     LIPOMA EXCISION Left    shoulder    TUBAL LIGATION     TUMOR REMOVAL Right    FOREARM   Social History   Social History Narrative   Not on file   Immunization History  Administered Date(s) Administered   Pneumococcal Conjugate-13 04/06/2018   Pneumococcal Polysaccharide-23 06/27/2007, 03/22/2009   Zoster Recombinat (Shingrix) 05/11/2019, 07/14/2019   Zoster, Live 05/11/2019     Objective: Vital Signs: BP (!) 144/82 (BP Location: Left Wrist, Patient Position: Sitting, Cuff Size: Normal)   Pulse 96   Resp 19   Ht 5\' 6"  (1.676 m)   Wt 239 lb 9.6 oz (108.7 kg)   BMI 38.67 kg/m    Physical Exam Vitals and nursing note reviewed.  Constitutional:      Appearance: She is well-developed.  HENT:     Head: Normocephalic and atraumatic.  Eyes:     Conjunctiva/sclera: Conjunctivae normal.  Cardiovascular:     Rate and Rhythm: Normal rate and regular rhythm.     Heart sounds: Normal heart sounds.  Pulmonary:     Effort: Pulmonary effort is normal.     Breath sounds: Normal breath sounds.  Abdominal:     General: Bowel sounds are normal.     Palpations: Abdomen is soft.  Musculoskeletal:     Cervical back: Normal range of motion.  Lymphadenopathy:     Cervical: No cervical adenopathy.  Skin:    General: Skin is warm and dry.     Capillary Refill: Capillary refill takes less than 2 seconds.  Neurological:     Mental Status: She is alert and oriented to person, place, and time.  Psychiatric:        Behavior: Behavior normal.      Musculoskeletal Exam: Cervical spine was in good range of motion.  Shoulder joints, elbow joints, wrist joints were in good range of motion.  She had bilateral CMC PIP and DIP thickening with no synovitis.  Hip joints and knee joints in good range of motion.  She has small abrasion on her right knee joint.  Left knee joint was in good range of motion.  There was no tenderness over ankles MTPs or PIPs.  No synovitis was noted.  CDAI  Exam: CDAI Score: -- Patient Global: 1 mm; Provider Global: 1 mm Swollen: --; Tender: -- Joint Exam 11/10/2022   No joint exam has been documented for this visit   There is currently no information documented on the homunculus. Go to the Rheumatology activity and complete the homunculus joint exam.  Investigation: No additional findings.  Imaging: No results found.  Recent Labs: Lab Results  Component Value Date   WBC 6.8 07/31/2022   HGB 14.9 07/31/2022   PLT 251 07/31/2022   NA 138 07/31/2022   K 4.7 07/31/2022   CL 102 07/31/2022   CO2 20 07/31/2022   GLUCOSE 173 (H) 07/31/2022   BUN 23 07/31/2022   CREATININE 1.06 (H) 07/31/2022   BILITOT 0.4 07/31/2022  ALKPHOS 63 07/31/2022   AST 28 07/31/2022   ALT 27 07/31/2022   PROT 6.7 07/31/2022   ALBUMIN 4.4 07/31/2022   CALCIUM 9.6 07/31/2022   GFRAA 76 10/22/2020    Speciality Comments: Methotrexate discontinued due to elevated creatinine.  Arava start date February 06, 2020  Procedures:  No procedures performed Allergies: Other and Theophylline   Assessment / Plan:     Visit Diagnoses: Seropositive rheumatoid arthritis of multiple sites (HCC) - +RF, +CCP, h/o elevated sed rate: Patient had no synovitis on examination.  She is pleased with the response from leflunomide.  She is currently on leflunomide 10 mg alternating with 20 mg every other day.  She denies any history of joint swelling since the last visit.  She has no morning stiffness.  High risk medication use - Arava 10 mg alternating with 20 mg every other day. -Labs obtained on July 31, 2022 CBC and CMP were normal with creatinine mildly elevated to 1.06 which has been stable.  Will check labs today and every 3 months to monitor for drug toxicity.  Information on immunization was placed in the AVS.  She was advised to hold leflunomide if she develops an infection resume after the infection resolves.  Plan: CBC with Differential/Platelet, COMPLETE METABOLIC  PANEL WITH GFR  Primary osteoarthritis of both hands-she had bilateral PIP and DIP thickening consistent with osteoarthritis.  Joint protection muscle strengthening was discussed.  Trochanteric bursitis, right hip - Resolved.  Osteopenia of multiple sites - DEXA 05/06/2021: The BMD measured at Forearm Radius 33% is 0.739 g/cm2 with a T-1.7 scoreof -1.7.  Calcium rich diet and vitamin D was advised.  Vitamin D deficiency-she takes vitamin D 1000 units daily.  History of diabetes mellitus  History of hypertension-blood pressure was elevated at 144/82.  Blood pressure was repeated.  She was advised to monitor blood pressure closely and follow-up with her PCP.  History of COPD-followed by Dr. Isaiah Serge.  Other medical problems listed as follows:  History of hyperlipidemia  Vertigo-patient states she has ongoing vertigo.  She had recent fall.  Neuropathy  Vocal cord dysfunction - related to prior medication use per patient.  History of recent fall-patient reports history of recent fall and landed on her right knee.  She has good range of motion of her right knee joint without any discomfort.  She has small abrasion on her right knee joint.  Orders: Orders Placed This Encounter  Procedures   CBC with Differential/Platelet   COMPLETE METABOLIC PANEL WITH GFR   No orders of the defined types were placed in this encounter.   Follow-Up Instructions: Return in about 5 months (around 04/12/2023) for Rheumatoid arthritis, Osteoarthritis.   Pollyann Savoy, MD  Note - This record has been created using Animal nutritionist.  Chart creation errors have been sought, but may not always  have been located. Such creation errors do not reflect on  the standard of medical care.

## 2022-11-02 ENCOUNTER — Ambulatory Visit: Payer: Medicare HMO | Admitting: Nurse Practitioner

## 2022-11-08 ENCOUNTER — Other Ambulatory Visit: Payer: Self-pay | Admitting: Nurse Practitioner

## 2022-11-08 DIAGNOSIS — I10 Essential (primary) hypertension: Secondary | ICD-10-CM

## 2022-11-10 ENCOUNTER — Encounter: Payer: Self-pay | Admitting: Rheumatology

## 2022-11-10 ENCOUNTER — Ambulatory Visit: Payer: Medicare HMO | Attending: Rheumatology | Admitting: Rheumatology

## 2022-11-10 VITALS — BP 159/82 | HR 92 | Resp 19 | Ht 66.0 in | Wt 239.6 lb

## 2022-11-10 DIAGNOSIS — Z8709 Personal history of other diseases of the respiratory system: Secondary | ICD-10-CM

## 2022-11-10 DIAGNOSIS — M7061 Trochanteric bursitis, right hip: Secondary | ICD-10-CM

## 2022-11-10 DIAGNOSIS — R42 Dizziness and giddiness: Secondary | ICD-10-CM

## 2022-11-10 DIAGNOSIS — Z79899 Other long term (current) drug therapy: Secondary | ICD-10-CM

## 2022-11-10 DIAGNOSIS — Z8679 Personal history of other diseases of the circulatory system: Secondary | ICD-10-CM | POA: Diagnosis not present

## 2022-11-10 DIAGNOSIS — E559 Vitamin D deficiency, unspecified: Secondary | ICD-10-CM | POA: Diagnosis not present

## 2022-11-10 DIAGNOSIS — Z8639 Personal history of other endocrine, nutritional and metabolic disease: Secondary | ICD-10-CM | POA: Diagnosis not present

## 2022-11-10 DIAGNOSIS — G629 Polyneuropathy, unspecified: Secondary | ICD-10-CM

## 2022-11-10 DIAGNOSIS — M19042 Primary osteoarthritis, left hand: Secondary | ICD-10-CM

## 2022-11-10 DIAGNOSIS — M19041 Primary osteoarthritis, right hand: Secondary | ICD-10-CM

## 2022-11-10 DIAGNOSIS — J383 Other diseases of vocal cords: Secondary | ICD-10-CM | POA: Diagnosis not present

## 2022-11-10 DIAGNOSIS — M0579 Rheumatoid arthritis with rheumatoid factor of multiple sites without organ or systems involvement: Secondary | ICD-10-CM

## 2022-11-10 DIAGNOSIS — M8589 Other specified disorders of bone density and structure, multiple sites: Secondary | ICD-10-CM

## 2022-11-10 DIAGNOSIS — Z9181 History of falling: Secondary | ICD-10-CM | POA: Diagnosis not present

## 2022-11-10 LAB — CBC WITH DIFFERENTIAL/PLATELET
Basophils Absolute: 63 cells/uL (ref 0–200)
Basophils Relative: 0.9 %
HCT: 40.1 % (ref 35.0–45.0)
MCHC: 32.4 g/dL (ref 32.0–36.0)
MPV: 10.5 fL (ref 7.5–12.5)
Monocytes Relative: 9.6 %
RDW: 12.2 % (ref 11.0–15.0)

## 2022-11-10 NOTE — Patient Instructions (Signed)
Standing Labs We placed an order today for your standing lab work.   Please have your standing labs drawn in August and every 3 months  Please have your labs drawn 2 weeks prior to your appointment so that the provider can discuss your lab results at your appointment, if possible.  Please note that you may see your imaging and lab results in MyChart before we have reviewed them. We will contact you once all results are reviewed. Please allow our office up to 72 hours to thoroughly review all of the results before contacting the office for clarification of your results.  WALK-IN LAB HOURS  Monday through Thursday from 8:00 am -12:30 pm and 1:00 pm-5:00 pm and Friday from 8:00 am-12:00 pm.  Patients with office visits requiring labs will be seen before walk-in labs.  You may encounter longer than normal wait times. Please allow additional time. Wait times may be shorter on  Monday and Thursday afternoons.  We do not book appointments for walk-in labs. We appreciate your patience and understanding with our staff.   Labs are drawn by [facility]. Please bring your co-pay at the time of your lab draw.  You may receive a bill from [facility] for your lab work.  Please note if you are on Hydroxychloroquine and and an order has been placed for a Hydroxychloroquine level,  you will need to have it drawn 4 hours or more after your last dose.  If you wish to have your labs drawn at another location, please call the office 24 hours in advance so we can fax the orders.  The office is located at [address], [address], [place], [place] [address]   If you have any questions regarding directions or hours of operation,  please call [phone].   As a reminder, please drink plenty of water prior to coming for your lab work. Thanks!   Vaccines You are taking a medication(s) that can suppress your immune system.  The following immunizations are recommended: Flu annually Covid-19  Td/Tdap (tetanus,  diphtheria, pertussis) every 10 years Pneumonia (Prevnar 15 then Pneumovax 23 at least 1 year apart.  Alternatively, can take Prevnar 20 without needing additional dose) Shingrix: 2 doses from 4 weeks to 6 months apart  Please check with your PCP to make sure you are up to date.   If you have signs or symptoms of an infection or start antibiotics: First, call your PCP for workup of your infection. Hold your medication through the infection, until you complete your antibiotics, and until symptoms resolve if you take the following: Injectable medication (Actemra, Benlysta, Cimzia, Cosentyx, Enbrel, Humira, Kevzara, Orencia, Remicade, Simponi, Stelara, Taltz, Tremfya) Methotrexate Leflunomide (Arava) Mycophenolate (Cellcept) Xeljanz, Olumiant, or Rinvoq  

## 2022-11-11 LAB — COMPLETE METABOLIC PANEL WITH GFR
AG Ratio: 1.7 (calc) (ref 1.0–2.5)
ALT: 20 U/L (ref 6–29)
AST: 20 U/L (ref 10–35)
Albumin: 4 g/dL (ref 3.6–5.1)
Alkaline phosphatase (APISO): 51 U/L (ref 37–153)
BUN: 12 mg/dL (ref 7–25)
CO2: 29 mmol/L (ref 20–32)
Calcium: 9.6 mg/dL (ref 8.6–10.4)
Chloride: 107 mmol/L (ref 98–110)
Creat: 0.81 mg/dL (ref 0.60–0.95)
Globulin: 2.3 g/dL (calc) (ref 1.9–3.7)
Glucose, Bld: 104 mg/dL — ABNORMAL HIGH (ref 65–99)
Potassium: 4.9 mmol/L (ref 3.5–5.3)
Sodium: 144 mmol/L (ref 135–146)
Total Bilirubin: 0.4 mg/dL (ref 0.2–1.2)
Total Protein: 6.3 g/dL (ref 6.1–8.1)
eGFR: 72 mL/min/{1.73_m2} (ref >=60)

## 2022-11-11 LAB — CBC WITH DIFFERENTIAL/PLATELET
Absolute Monocytes: 672 cells/uL (ref 200–950)
Eosinophils Absolute: 210 cells/uL (ref 15–500)
Eosinophils Relative: 3 %
Hemoglobin: 13 g/dL (ref 11.7–15.5)
Lymphs Abs: 1064 cells/uL (ref 850–3900)
MCH: 28.9 pg (ref 27.0–33.0)
MCV: 89.1 fL (ref 80.0–100.0)
Neutro Abs: 4991 cells/uL (ref 1500–7800)
Neutrophils Relative %: 71.3 %
Platelets: 239 10*3/uL (ref 140–400)
RBC: 4.5 10*6/uL (ref 3.80–5.10)
Total Lymphocyte: 15.2 %
WBC: 7 10*3/uL (ref 3.8–10.8)

## 2022-11-11 NOTE — Progress Notes (Signed)
CBC and CMP are normal.

## 2022-11-17 ENCOUNTER — Encounter: Payer: Self-pay | Admitting: Nurse Practitioner

## 2022-11-17 ENCOUNTER — Ambulatory Visit (INDEPENDENT_AMBULATORY_CARE_PROVIDER_SITE_OTHER): Payer: Medicare HMO | Admitting: Nurse Practitioner

## 2022-11-17 VITALS — BP 136/75 | HR 94 | Ht 66.0 in | Wt 230.1 lb

## 2022-11-17 DIAGNOSIS — Z7984 Long term (current) use of oral hypoglycemic drugs: Secondary | ICD-10-CM | POA: Diagnosis not present

## 2022-11-17 DIAGNOSIS — E1159 Type 2 diabetes mellitus with other circulatory complications: Secondary | ICD-10-CM

## 2022-11-17 DIAGNOSIS — E1165 Type 2 diabetes mellitus with hyperglycemia: Secondary | ICD-10-CM

## 2022-11-17 DIAGNOSIS — I152 Hypertension secondary to endocrine disorders: Secondary | ICD-10-CM | POA: Diagnosis not present

## 2022-11-17 DIAGNOSIS — E662 Morbid (severe) obesity with alveolar hypoventilation: Secondary | ICD-10-CM | POA: Diagnosis not present

## 2022-11-17 DIAGNOSIS — J449 Chronic obstructive pulmonary disease, unspecified: Secondary | ICD-10-CM

## 2022-11-17 LAB — POCT GLYCOSYLATED HEMOGLOBIN (HGB A1C): HbA1c POC (<> result, manual entry): 5.7 %

## 2022-11-17 MED ORDER — TRELEGY ELLIPTA 100-62.5-25 MCG/ACT IN AEPB
INHALATION_SPRAY | RESPIRATORY_TRACT | 5 refills | Status: DC
Start: 2022-11-17 — End: 2023-09-23

## 2022-11-17 NOTE — Progress Notes (Signed)
Reviewed with patient during visit

## 2022-11-17 NOTE — Progress Notes (Signed)
0Established patient visit   Patient: Tina Hansen   DOB: [birth year]   82 y.o. Female  MRN: [id number] Visit Date: 11/17/2022   Chief Complaint  Patient presents with   Follow-up   Subjective    HPI  Follow up  -DM2, uncontrolled. --HgbA1c today is 5.7 today, down from 10.0 at last check.   -HTN -blood pressure doing much better today  Has lost 6 pounds since her last visit with me.   -she did have epidose of vertigo 11/09/2022 which caused her to fall while she was out with friends.  -hit concrete floor.  -bruising to right knee and right rib cage.  -was having trouble taking deep breaths, but this is gradually improving.   Does have COPD -needs to have refill for Trelegy maintenance inhaler.  -does see pulmonology, but they asked that we refill if needed in between her visits with them.     Medications: Outpatient Medications Prior to Visit  Medication Sig   albuterol (PROVENTIL) (2.5 MG/3ML) 0.083% nebulizer solution USE ONE VIAL IN NEBULIZER EVERY 6 HOURS AS NEEDED   albuterol (VENTOLIN HFA) 108 (90 Base) MCG/ACT inhaler INHALE 1 TO 2 PUFFS BY MOUTH EVERY 6 HOURS AS NEEDED FOR WHEEZING AND FOR SHORTNESS OF BREATH   Ascorbic Acid (VITAMIN C PO) Take by mouth daily.   Biotin 5000 MCG CAPS Take 10,000 mcg by mouth daily.   calcium gluconate 500 MG tablet Take 500 mg by mouth daily.   Cholecalciferol (VITAMIN D3) 1000 units CAPS Take 1 capsule by mouth daily.    Chromium 1 MG CAPS Take 1 mg by mouth daily. Increase to 2 caps if needed   Coenzyme Q10 (COQ-10) 100 MG CAPS Take 1 capsule by mouth daily.   ECHINACEA PO Take by mouth daily.   fish oil-omega-3 fatty acids 1000 MG capsule Take 2 capsules by mouth daily.   glipiZIDE (GLUCOTROL) 5 MG tablet Take 1 tablet po QD   hydrALAZINE (APRESOLINE) 25 MG tablet TAKE 1 TABLET BY MOUTH THREE TIMES DAILY   LUTEIN PO Take by mouth daily.   Methylsulfonylmethane (MSM PO) Take by mouth daily.   Milk Thistle 200 MG CAPS Take  400 mg by mouth daily.   Multiple Vitamin (MULTIVITAMIN) capsule Take 1 capsule by mouth daily.   NON FORMULARY Take 1 capsule by mouth daily. Collagen capsule   Semaglutide, 1 MG/DOSE, (OZEMPIC, 1 MG/DOSE,) 4 MG/3ML SOPN Inject 1 mg into the skin once a week.   valsartan (DIOVAN) 160 MG tablet Take 2 tablets (320 mg total) by mouth daily.   vitamin E 180 MG (400 UNITS) capsule Take 400 Units by mouth daily.   [DISCONTINUED] Fluticasone-Umeclidin-Vilant (TRELEGY ELLIPTA) 100-62.5-25 MCG/ACT AEPB INHALE 1 PUFF INTO LUNGS ONCE DAILY   [DISCONTINUED] leflunomide (ARAVA) 20 MG tablet TAKE 1/2 (ONE-HALF) TABLET BY MOUTH EVERY OTHER DAY ALTERNATING  WITH  1  TABLET  EVERY  OTHER  DAY   No facility-administered medications prior to visit.    Review of Systems See HPI     Last CBC Lab Results  Component Value Date   WBC 7.0 11/10/2022   HGB 13.0 11/10/2022   HCT 40.1 11/10/2022   MCV 89.1 11/10/2022   MCH 28.9 11/10/2022   RDW 12.2 11/10/2022   PLT 239 11/10/2022   Last metabolic panel Lab Results  Component Value Date   GLUCOSE 104 (H) 11/10/2022   NA 144 11/10/2022   K 4.9 11/10/2022   CL 107 11/10/2022   CO2 29  11/10/2022   BUN 12 11/10/2022   CREATININE 0.81 11/10/2022   EGFR 72 11/10/2022   CALCIUM 9.6 11/10/2022   PROT 6.3 11/10/2022   ALBUMIN 4.4 07/31/2022   LABGLOB 2.3 07/31/2022   AGRATIO 1.9 07/31/2022   BILITOT 0.4 11/10/2022   ALKPHOS 63 07/31/2022   AST 20 11/10/2022   ALT 20 11/10/2022   ANIONGAP 11 07/17/2022   Last lipids Lab Results  Component Value Date   CHOL 257 (H) 07/31/2022   HDL 75 07/31/2022   LDLCALC 157 (H) 07/31/2022   TRIG 144 07/31/2022   CHOLHDL 3.4 07/31/2022   Last hemoglobin A1c Lab Results  Component Value Date   HGBA1C 5.7 11/17/2022   Last thyroid functions Lab Results  Component Value Date   TSH 1.920 07/31/2022   T3TOTAL 149 03/20/2019   Last vitamin D Lab Results  Component Value Date   VD25OH 70.6 07/31/2022        Objective     Today's Vitals   11/17/22 1513  BP: 136/75  Pulse: 94  SpO2: 95%  Weight: 230 lb 1.9 oz (104.4 kg)  Height: 5\' 6"  (1.676 m)   Body mass index is 37.14 kg/m.  BP Readings from Last 3 Encounters:  11/17/22 136/75  11/10/22 (Abnormal) 159/82  09/08/22 138/62    Wt Readings from Last 3 Encounters:  11/17/22 230 lb 1.9 oz (104.4 kg)  11/10/22 239 lb 9.6 oz (108.7 kg)  09/08/22 236 lb 9.6 oz (107.3 kg)    Physical Exam Vitals and nursing note reviewed.  Constitutional:      Appearance: Normal appearance. She is well-developed.  HENT:     Head: Normocephalic and atraumatic.     Nose: Nose normal.     Mouth/Throat:     Mouth: Mucous membranes are moist.     Pharynx: Oropharynx is clear.  Eyes:     Extraocular Movements: Extraocular movements intact.     Conjunctiva/sclera: Conjunctivae normal.     Pupils: Pupils are equal, round, and reactive to light.  Neck:     Vascular: No carotid bruit.  Cardiovascular:     Rate and Rhythm: Normal rate and regular rhythm.     Pulses: Normal pulses.     Heart sounds: Normal heart sounds.  Pulmonary:     Effort: Pulmonary effort is normal.     Breath sounds: Normal breath sounds.  Abdominal:     Palpations: Abdomen is soft.  Musculoskeletal:        General: Normal range of motion.     Cervical back: Normal range of motion and neck supple.  Lymphadenopathy:     Cervical: No cervical adenopathy.  Skin:    General: Skin is warm and dry.     Capillary Refill: Capillary refill takes less than 2 seconds.       Neurological:     General: No focal deficit present.     Mental Status: She is alert and oriented to person, place, and time.  Psychiatric:        Mood and Affect: Mood normal.        Behavior: Behavior normal.        Thought Content: Thought content normal.        Judgment: Judgment normal.   Improved  Results for orders placed or performed in visit on 11/17/22  POCT glycosylated hemoglobin (Hb  A1C)  Result Value Ref Range   Hemoglobin A1C     HbA1c POC (<> result, manual entry) 5.7 4.0 -  5.6 %   HbA1c, POC (prediabetic range)     HbA1c, POC (controlled diabetic range)      Assessment & Plan    Type 2 diabetes mellitus with hyperglycemia, without long-term current use of insulin (HCC) Assessment & Plan: Hemoglobin A1c 5.7 down from 10.0 at last visit. -Continue diabetic medication as prescribed. -Recheck hemoglobin A1c in 3 months.  Orders: -     POCT glycosylated hemoglobin (Hb A1C)  Hypertension associated with type 2 diabetes mellitus (HCC) Assessment & Plan: Improved and stable. -Continue current medication.  -Continue regular visits with cardiology.  Orders: -     POCT glycosylated hemoglobin (Hb A1C)  Class 3 obesity with alveolar hypoventilation, serious comorbidity, and body mass index (BMI) of 45.0 to 49.9 in adult [facility]) Assessment & Plan: Improving. -Discussed lowering calorie intake to 1500 calories per day and incorporating exercise into daily routine to help lose weight.    COPD mixed type [facility]) Assessment & Plan: Stable. -Continue inhalers and respiratory medications as prescribed. -Regular visits with pulmonology as scheduled.  Orders: -     Trelegy Ellipta; INHALE 1 PUFF INTO LUNGS ONCE DAILY  Dispense: 60 each; Refill: 5     Return in about 3 months (around 02/17/2023) for diabetes with HgbA1c check. can have MWV at any time. Marland Kitchen         Carlean Jews, NP  [facility] [phone] (phone) [phone] (fax)  [facility]

## 2022-12-03 ENCOUNTER — Other Ambulatory Visit: Payer: Self-pay | Admitting: Physician Assistant

## 2022-12-03 NOTE — Telephone Encounter (Signed)
Last Fill: 08/25/2022  Labs: 11/10/2022 CBC and CMP are normal.   Next Visit: 04/13/2023  Last Visit: 11/10/2022  DX: Seropositive rheumatoid arthritis of multiple sites   Current Dose per office note 11/10/2022: Arava 10 mg alternating with 20 mg every other day   Okay to refill Arava ?

## 2022-12-06 NOTE — Assessment & Plan Note (Signed)
Stable. -Continue inhalers and respiratory medications as prescribed. -Regular visits with pulmonology as scheduled.

## 2022-12-06 NOTE — Assessment & Plan Note (Signed)
Improving. -Discussed lowering calorie intake to 1500 calories per day and incorporating exercise into daily routine to help lose weight.

## 2022-12-06 NOTE — Assessment & Plan Note (Signed)
Improved and stable. -Continue current medication.  -Continue regular visits with cardiology.

## 2022-12-06 NOTE — Assessment & Plan Note (Signed)
Hemoglobin A1c 5.7 down from 10.0 at last visit. -Continue diabetic medication as prescribed. -Recheck hemoglobin A1c in 3 months.

## 2022-12-12 ENCOUNTER — Other Ambulatory Visit: Payer: Self-pay | Admitting: Physician Assistant

## 2022-12-12 ENCOUNTER — Other Ambulatory Visit: Payer: Self-pay | Admitting: Nurse Practitioner

## 2022-12-12 DIAGNOSIS — I10 Essential (primary) hypertension: Secondary | ICD-10-CM

## 2023-01-17 ENCOUNTER — Other Ambulatory Visit: Payer: Self-pay | Admitting: Nurse Practitioner

## 2023-01-17 DIAGNOSIS — I10 Essential (primary) hypertension: Secondary | ICD-10-CM

## 2023-02-01 ENCOUNTER — Other Ambulatory Visit: Payer: Self-pay | Admitting: Nurse Practitioner

## 2023-02-01 DIAGNOSIS — E1165 Type 2 diabetes mellitus with hyperglycemia: Secondary | ICD-10-CM

## 2023-02-01 DIAGNOSIS — E785 Hyperlipidemia, unspecified: Secondary | ICD-10-CM

## 2023-02-03 ENCOUNTER — Other Ambulatory Visit: Payer: Self-pay | Admitting: *Deleted

## 2023-02-03 DIAGNOSIS — Z79899 Other long term (current) drug therapy: Secondary | ICD-10-CM | POA: Diagnosis not present

## 2023-02-04 LAB — COMPLETE METABOLIC PANEL WITH GFR
AG Ratio: 1.6 (calc) (ref 1.0–2.5)
ALT: 16 U/L (ref 6–29)
AST: 13 U/L (ref 10–35)
Albumin: 4.1 g/dL (ref 3.6–5.1)
Alkaline phosphatase (APISO): 66 U/L (ref 37–153)
BUN: 17 mg/dL (ref 7–25)
CO2: 24 mmol/L (ref 20–32)
Calcium: 9.5 mg/dL (ref 8.6–10.4)
Chloride: 106 mmol/L (ref 98–110)
Creat: 0.82 mg/dL (ref 0.60–0.95)
Globulin: 2.6 g/dL (ref 1.9–3.7)
Glucose, Bld: 150 mg/dL — ABNORMAL HIGH (ref 65–99)
Potassium: 4.7 mmol/L (ref 3.5–5.3)
Sodium: 139 mmol/L (ref 135–146)
Total Bilirubin: 0.5 mg/dL (ref 0.2–1.2)
Total Protein: 6.7 g/dL (ref 6.1–8.1)
eGFR: 71 mL/min/{1.73_m2} (ref >=60)

## 2023-02-04 LAB — CBC WITH DIFFERENTIAL/PLATELET
Absolute Monocytes: 771 {cells}/uL (ref 200–950)
Basophils Absolute: 67 {cells}/uL (ref 0–200)
Basophils Relative: 1 %
Eosinophils Absolute: 248 {cells}/uL (ref 15–500)
Eosinophils Relative: 3.7 %
HCT: 40.6 % (ref 35.0–45.0)
Hemoglobin: 13.1 g/dL (ref 11.7–15.5)
Lymphs Abs: 1427 {cells}/uL (ref 850–3900)
MCH: 28.5 pg (ref 27.0–33.0)
MCHC: 32.3 g/dL (ref 32.0–36.0)
MCV: 88.5 fL (ref 80.0–100.0)
MPV: 10.5 fL (ref 7.5–12.5)
Monocytes Relative: 11.5 %
Neutro Abs: 4188 {cells}/uL (ref 1500–7800)
Neutrophils Relative %: 62.5 %
Platelets: 234 10*3/uL (ref 140–400)
RBC: 4.59 10*6/uL (ref 3.80–5.10)
RDW: 12.7 % (ref 11.0–15.0)
Total Lymphocyte: 21.3 %
WBC: 6.7 10*3/uL (ref 3.8–10.8)

## 2023-02-04 NOTE — Progress Notes (Signed)
CBC and CMP are normal.  Glucose is elevated at 150.  Please forward results to her PCP.

## 2023-02-16 ENCOUNTER — Encounter: Payer: Self-pay | Admitting: Family Medicine

## 2023-02-16 ENCOUNTER — Ambulatory Visit (INDEPENDENT_AMBULATORY_CARE_PROVIDER_SITE_OTHER): Payer: 59 | Admitting: Family Medicine

## 2023-02-16 VITALS — BP 153/87 | HR 87 | Ht 66.0 in | Wt 238.0 lb

## 2023-02-16 DIAGNOSIS — E1165 Type 2 diabetes mellitus with hyperglycemia: Secondary | ICD-10-CM | POA: Diagnosis not present

## 2023-02-16 DIAGNOSIS — Z7985 Long-term (current) use of injectable non-insulin antidiabetic drugs: Secondary | ICD-10-CM | POA: Diagnosis not present

## 2023-02-16 DIAGNOSIS — E1159 Type 2 diabetes mellitus with other circulatory complications: Secondary | ICD-10-CM

## 2023-02-16 DIAGNOSIS — R42 Dizziness and giddiness: Secondary | ICD-10-CM | POA: Diagnosis not present

## 2023-02-16 DIAGNOSIS — R350 Frequency of micturition: Secondary | ICD-10-CM | POA: Diagnosis not present

## 2023-02-16 LAB — POCT GLYCOSYLATED HEMOGLOBIN (HGB A1C): HbA1c POC (<> result, manual entry): 7.4 % (ref 4.0–5.6)

## 2023-02-16 MED ORDER — OZEMPIC (0.25 OR 0.5 MG/DOSE) 2 MG/3ML ~~LOC~~ SOPN: 0.5000 mg | PEN_INJECTOR | SUBCUTANEOUS | 0 refills | Status: DC

## 2023-02-16 MED ORDER — OZEMPIC (1 MG/DOSE) 4 MG/3ML ~~LOC~~ SOPN
1.0000 mg | PEN_INJECTOR | SUBCUTANEOUS | 2 refills | Status: DC
Start: 2023-03-18 — End: 2023-06-14

## 2023-02-16 NOTE — Assessment & Plan Note (Signed)
Has had issues with vertigo going back to last year.  States has been at least since October.  Patient currently with no auditory symptoms..  States the vertigo is "constant".  Had a MRI of the brain in January that showed a small possible infarct on the right internal capsule.  This could be the cause of her vertigo.  Patient declined referral to neuro PT.

## 2023-02-16 NOTE — Assessment & Plan Note (Signed)
Patient states that her Ozempic refill was declined at some point several months ago and she has not taken it since then.  Only taking glipizide.  Has noticed blood sugars have been more elevated.  Her A1c today was increased from previous.  Could not find any documentation to see why patient had not been getting her Ozempic.  Will restart her Ozempic today.  Will start at 0.5 mg/week and increase to 1 mg which was her previous dose after 1 month. - Follow-up in 3 months - Recommended patient have diabetic retinopathy screening with her optometrist.  Patient currently looking for a new optometrist.

## 2023-02-16 NOTE — Assessment & Plan Note (Signed)
Patient complains of increased urinary frequency with minimal output.  Going on over the past year at least.  Discussed possibilities such as overactive bladder.  Does not appear to be hyperglycemia related as this was occurring even when her blood sugar was better controlled.  Patient does not want referral to urology at this time.

## 2023-02-16 NOTE — Progress Notes (Unsigned)
Established Patient Office Visit  Subjective   Patient ID: Tina Hansen, female    DOB: 1940/07/04  Age: 82 y.o. MRN: [id number]  Chief Complaint  Patient presents with   Medical Management of Chronic Issues    HPI  Dm2 -patient states she is no longer taking her semaglutide.  Has not taken it in several months.  States that it was canceled but she does not know why.  Has only been to the ED recently for high blood pressure but no other reasons.  No history of pancreatitis.  She is still taking glipizide.  Has noticed that her blood sugars have been elevated recently.  She is okay with restarting her Ozempic.   Vertigo-patient has had vertigo since last October at least.  Has had several episodes.  Now states it is constant, 24 hours a day.  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  Patient is not interested in neuro PT at this time.  Patient has no hearing complaints.  Patient complaining of increased urinary frequency.  This has been going on for the past year.  Has to go regularly, every hour and then when she goes only minimal amounts come out.  Patient has had 1 vaginal birth in her lifetime but only developed symptoms 1 year ago.  She declines referral to urologist.   The ASCVD Risk score (Arnett DK, et al., 2019) failed to calculate for the following reasons:   The 2019 ASCVD risk score is only valid for ages 26 to 66  Health Maintenance Due  Topic Date Due   COVID-19 Vaccine (1) Never done   OPHTHALMOLOGY EXAM  Never done   DTaP/Tdap/Td (1 - Tdap) Never done   Medicare Annual Wellness (AWV)  10/29/2021   INFLUENZA VACCINE  01/21/2023      Objective:     BP (!) 153/87   Pulse 87   Ht 5\' 6"  (1.676 m)   Wt 238 lb (108 kg)   SpO2 95%   BMI 38.41 kg/m    Physical Exam General: Alert, oriented Pulmonary: No respirations Psych: Pleasant affect.   Results for orders placed or performed in visit on 02/16/23  POCT HgB A1C   Result Value Ref Range   Hemoglobin A1C     HbA1c POC (<> result, manual entry) 7.4 4.0 - 5.6 %   HbA1c, POC (prediabetic range)     HbA1c, POC (controlled diabetic range)          Assessment & Plan:   Type 2 diabetes mellitus with hyperglycemia, without long-term current use of insulin (HCC) -     POCT glycosylated hemoglobin (Hb A1C)  Uncontrolled type 2 diabetes mellitus with hyperglycemia (HCC) Assessment & Plan: Patient states that her Ozempic refill was declined at some point several months ago and she has not taken it since then.  Only taking glipizide.  Has noticed blood sugars have been more elevated.  Her A1c today was increased from previous.  Could not find any documentation to see why patient had not been getting her Ozempic.  Will restart her Ozempic today.  Will start at 0.5 mg/week and increase to 1 mg which was her previous dose after 1 month. - Follow-up in 3 months - Recommended patient have diabetic retinopathy screening with her optometrist.  Patient currently looking for a new optometrist.  Orders: -     Ozempic (1 MG/DOSE); Inject 1 mg into the skin once a week.  Dispense: 3 mL; Refill:  2  Vertigo Assessment & Plan: Has had issues with vertigo going back to last year.  States has been at least since October.  Patient currently with no auditory symptoms..  States the vertigo is "constant".  Had a MRI of the brain in January that showed a small possible infarct on the right internal capsule.  This could be the cause of her vertigo.  Patient declined referral to neuro PT.   Urinary frequency Assessment & Plan: Patient complains of increased urinary frequency with minimal output.  Going on over the past year at least.  Discussed possibilities such as overactive bladder.  Does not appear to be hyperglycemia related as this was occurring even when her blood sugar was better controlled.  Patient does not want referral to urology at this time.   Other orders -      Ozempic (0.25 or 0.5 MG/DOSE); Inject 0.5 mg into the skin once a week.  Dispense: 3 mL; Refill: 0   I spent 30 minutes in the management of this patient.   Return in about 3 months (around 05/19/2023) for DM.    Sandre Kitty, MD

## 2023-02-16 NOTE — Patient Instructions (Signed)
It was nice to see you today,  We addressed the following topics today: --I have resent in your Ozempic.  Take it half a milligram dose for the first 4 weeks and then you will start taking 1 mg dose after that. - Follow-up in 3 months. - We can send in a referral to neuro PT for your dizziness if you would like - I can send in a referral to urology to manage your urinary complaints if you would like.  Just let us know.  Have a great day,  Frederic Jericho, MD

## 2023-02-24 ENCOUNTER — Other Ambulatory Visit: Payer: Self-pay | Admitting: Family Medicine

## 2023-02-24 ENCOUNTER — Other Ambulatory Visit: Payer: Self-pay | Admitting: Physician Assistant

## 2023-02-24 DIAGNOSIS — I10 Essential (primary) hypertension: Secondary | ICD-10-CM

## 2023-02-24 NOTE — Telephone Encounter (Signed)
Last Fill: 12/03/2022  Labs: 02/03/2023 CBC and CMP are normal.  Glucose is elevated at 150.   Next Visit: 04/13/2023  Last Visit: 11/10/2022  DX: Seropositive rheumatoid arthritis of multiple sites   Current Dose per office note 11/10/2022:  Arava 10 mg alternating with 20 mg every other day   Okay to refill Arava ?

## 2023-03-09 ENCOUNTER — Ambulatory Visit: Payer: 59

## 2023-03-09 VITALS — Ht 66.0 in | Wt 235.0 lb

## 2023-03-09 DIAGNOSIS — Z0000 Encounter for general adult medical examination without abnormal findings: Secondary | ICD-10-CM

## 2023-03-09 NOTE — Progress Notes (Signed)
Subjective:   Tina Hansen is a 82 y.o. female who presents for Medicare Annual (Subsequent) preventive examination.  Visit Complete: Virtual  I connected with  Tina Hansen on 03/09/23 by a audio enabled telemedicine application and verified that I am speaking with the correct person using two identifiers.  Patient Location: Home  Provider Location: Home Office  I discussed the limitations of evaluation and management by telemedicine. The patient expressed understanding and agreed to proceed.   Cardiac Risk Factors include: advanced age (>27men, >51 women);diabetes mellitus;hypertension     Objective:    Today's Vitals   03/09/23 1304  Weight: 235 lb (106.6 kg)  Height: 5\' 6"  (1.676 m)   Body mass index is 37.93 kg/m.     03/09/2023    1:14 PM 07/17/2022    3:14 AM 09/08/2017   11:39 AM  Advanced Directives  Does Patient Have a Medical Advance Directive? No No No  Would patient like information on creating a medical advance directive? No - Patient declined  No - Patient declined    Current Medications (verified) Outpatient Encounter Medications as of 03/09/2023  Medication Sig   albuterol (PROVENTIL) (2.5 MG/3ML) 0.083% nebulizer solution USE ONE VIAL IN NEBULIZER EVERY 6 HOURS AS NEEDED   albuterol (VENTOLIN HFA) 108 (90 Base) MCG/ACT inhaler INHALE 1 TO 2 PUFFS BY MOUTH EVERY 6 HOURS AS NEEDED FOR WHEEZING AND FOR SHORTNESS OF BREATH   Ascorbic Acid (VITAMIN C PO) Take by mouth daily.   Biotin 5000 MCG CAPS Take 10,000 mcg by mouth daily.   calcium gluconate 500 MG tablet Take 500 mg by mouth daily.   Cholecalciferol (VITAMIN D3) 1000 units CAPS Take 1 capsule by mouth daily.    Chromium 1 MG CAPS Take 1 mg by mouth daily. Increase to 2 caps if needed   Coenzyme Q10 (COQ-10) 100 MG CAPS Take 1 capsule by mouth daily.   ECHINACEA PO Take by mouth daily.   fish oil-omega-3 fatty acids 1000 MG capsule Take 2 capsules by mouth daily.    Fluticasone-Umeclidin-Vilant (TRELEGY ELLIPTA) 100-62.5-25 MCG/ACT AEPB INHALE 1 PUFF INTO LUNGS ONCE DAILY   glipiZIDE (GLUCOTROL) 5 MG tablet Take 1 tablet by mouth once daily   hydrALAZINE (APRESOLINE) 25 MG tablet TAKE 1 TABLET BY MOUTH THREE TIMES DAILY   leflunomide (ARAVA) 20 MG tablet TAKE 1/2 A TABLET BY MOUTH EVERY OTHER DAY ALTERNATING WITH 1 TAB EVERY OTHER DAY   LUTEIN PO Take by mouth daily.   Methylsulfonylmethane (MSM PO) Take by mouth daily.   Milk Thistle 200 MG CAPS Take 400 mg by mouth daily.   Multiple Vitamin (MULTIVITAMIN) capsule Take 1 capsule by mouth daily.   NON FORMULARY Take 1 capsule by mouth daily. Collagen capsule   [START ON 03/18/2023] Semaglutide, 1 MG/DOSE, (OZEMPIC, 1 MG/DOSE,) 4 MG/3ML SOPN Inject 1 mg into the skin once a week.   Semaglutide,0.25 or 0.5MG /DOS, (OZEMPIC, 0.25 OR 0.5 MG/DOSE,) 2 MG/3ML SOPN Inject 0.5 mg into the skin once a week.   valsartan (DIOVAN) 160 MG tablet Take 2 tablets by mouth once daily   vitamin E 180 MG (400 UNITS) capsule Take 400 Units by mouth daily.   No facility-administered encounter medications on file as of 03/09/2023.    Allergies (verified) Other and Theophylline   History: Past Medical History:  Diagnosis Date   Asthma    Cervical cancer (HCC)    Diabetes (HCC)    Emphysema lung (HCC)    Hyperlipidemia  Hypertension    Rheumatoid arthritis(714.0)    [name]   Past Surgical History:  Procedure Laterality Date   ABDOMINAL HYSTERECTOMY     BREAST SURGERY     Breat implants     LIPOMA EXCISION Left    shoulder    TUBAL LIGATION     TUMOR REMOVAL Right    FOREARM   Family History  Problem Relation Age of Onset   Asthma Mother    Breast cancer Mother    Cancer Mother    Alcohol abuse Father    Diabetes Son    Cancer Maternal Uncle    Heart attack Maternal Uncle    Social History   Socioeconomic History   Marital status: Divorced    Spouse name: Not on file   Number of children: 1    Years of education: Not on file   Highest education level: Not on file  Occupational History   Occupation: retired  Tobacco Use   Smoking status: Former    Current packs/day: 0.00    Average packs/day: 1 pack/day for 30.0 years (30.0 ttl pk-yrs)    Types: Cigarettes    Start date: 06/23/1955    Quit date: 06/22/1985    Years since quitting: 37.7    Passive exposure: Never   Smokeless tobacco: Never  Vaping Use   Vaping status: Never Used  Substance and Sexual Activity   Alcohol use: Yes    Comment: OCCASSIONAL GLASS OF WINE 1-2 MONTHLY   Drug use: No   Sexual activity: Never  Other Topics Concern   Not on file  Social History Narrative   Not on file   Social Determinants of Health   Financial Resource Strain: Low Risk  (03/09/2023)   Overall Financial Resource Strain (CARDIA)    Difficulty of Paying Living Expenses: Not hard at all  Food Insecurity: No Food Insecurity (03/09/2023)   Hunger Vital Sign    Worried About Running Out of Food in the Last Year: Never true    Ran Out of Food in the Last Year: Never true  Transportation Needs: No Transportation Needs (03/09/2023)   PRAPARE - Administrator, Civil Service (Medical): No    Lack of Transportation (Non-Medical): No  Physical Activity: Inactive (03/09/2023)   Exercise Vital Sign    Days of Exercise per Week: 0 days    Minutes of Exercise per Session: 0 min  Stress: No Stress Concern Present (03/09/2023)   [employer] of Occupational Health - Occupational Stress Questionnaire    Feeling of Stress : Not at all  Social Connections: Socially Isolated (03/09/2023)   Social Connection and Isolation Panel [NHANES]    Frequency of Communication with Friends and Family: More than three times a week    Frequency of Social Gatherings with Friends and Family: More than three times a week    Attends Religious Services: Never    Database administrator or Organizations: No    Attends Engineer, structural: Never     Marital Status: Divorced    Tobacco Counseling Counseling given: Not Answered   Clinical Intake:  Pre-visit preparation completed: Yes  Pain : No/denies pain     BMI - recorded: 37.93 Nutritional Status: BMI > 30  Obese Nutritional Risks: None Diabetes: Yes CBG done?: No Did pt. bring in CBG monitor from home?: No  How often do you need to have someone help you when you read instructions, pamphlets, or other written materials from your doctor  or pharmacy?: 1 - Never  Interpreter Needed?: No  Information entered by :: Theresa Mulligan LPN   Activities of Daily Living    03/09/2023    1:11 PM 05/04/2022    3:24 PM  In your present state of health, do you have any difficulty performing the following activities:  Hearing? 0 0  Vision? 0 0  Difficulty concentrating or making decisions? 0 0  Walking or climbing stairs? 0 1  Dressing or bathing? 0 0  Doing errands, shopping? 0 0  Preparing Food and eating ? N   Using the Toilet? N   In the past six months, have you accidently leaked urine? N   Do you have problems with loss of bowel control? N   Managing your Medications? N   Managing your Finances? N   Housekeeping or managing your Housekeeping? N     Patient Care Team: Sandre Kitty, MD as PCP - General (Family Medicine) Michele Mcalpine, MD as Consulting Physician (Pulmonary Disease) Pollyann Savoy, MD as Consulting Physician (Rheumatology) Chilton Greathouse, MD as Consulting Physician (Pulmonary Disease) Mayer Masker, PA-C (Inactive) as Physician Assistant (Physician Assistant)  Indicate any recent Medical Services you may have received from other than [facility] providers in the past year (date may be approximate).     Assessment:   This is a routine wellness examination for [place].  Hearing/Vision screen Hearing Screening - Comments:: Denies hearing difficulties   Vision Screening - Comments:: Wears rx glasses - up to date with routine eye exams with  Dr  Nedra Hai   Goals Addressed               This Visit's Progress     Stay Healthy (pt-stated)        Complete personal projects       Depression Screen    03/09/2023    1:09 PM 02/16/2023    2:23 PM 11/17/2022    3:17 PM 07/31/2022   11:17 AM 07/21/2022    8:31 AM 05/04/2022    3:24 PM 12/29/2021    2:05 PM  PHQ 2/9 Scores  PHQ - 2 Score 0 0 0 1 1 1  0  PHQ- 9 Score 0 0 4 7 3 5 1     Fall Risk    03/09/2023    1:12 PM 05/04/2022    3:24 PM 12/29/2021    2:05 PM 09/01/2021    2:45 PM 06/02/2021    2:52 PM  Fall Risk   Falls in the past year? 1 1 1 1 1   Number falls in past yr: 0 0 0 0 1  Injury with Fall? 0 0 0 0 0  Risk for fall due to : No Fall Risks  History of fall(s) No Fall Risks Impaired balance/gait  Follow up Falls prevention discussed  Falls evaluation completed Falls evaluation completed Falls evaluation completed    MEDICARE RISK AT HOME: Medicare Risk at Home Any stairs in or around the home?: Yes If so, are there any without handrails?: No Home free of loose throw rugs in walkways, pet beds, electrical cords, etc?: Yes Adequate lighting in your home to reduce risk of falls?: Yes Life alert?: No Use of a cane, walker or w/c?: No Grab bars in the bathroom?: No Shower chair or bench in shower?: No Elevated toilet seat or a handicapped toilet?: No  TIMED UP AND GO:  Was the test performed?  No    Cognitive Function:  03/09/2023    1:14 PM 10/29/2020    2:59 PM  6CIT Screen  What Year? 0 points 0 points  What month? 0 points 0 points  What time? 0 points 0 points  Count back from 20 0 points 0 points  Months in reverse 0 points 0 points  Repeat phrase 0 points 0 points  Total Score 0 points 0 points    Immunizations Immunization History  Administered Date(s) Administered   Pneumococcal Conjugate-13 04/06/2018   Pneumococcal Polysaccharide-23 06/27/2007, 03/22/2009   Zoster Recombinant(Shingrix) 05/11/2019, 07/14/2019   Zoster, Live  05/11/2019    TDAP status: Due, Education has been provided regarding the importance of this vaccine. Advised may receive this vaccine at local pharmacy or Health Dept. Aware to provide a copy of the vaccination record if obtained from local pharmacy or Health Dept. Verbalized acceptance and understanding.  Flu Vaccine status: Declined, Education has been provided regarding the importance of this vaccine but patient still declined. Advised may receive this vaccine at local pharmacy or Health Dept. Aware to provide a copy of the vaccination record if obtained from local pharmacy or Health Dept. Verbalized acceptance and understanding.  Pneumococcal vaccine status: Up to date  Covid-19 vaccine status: Declined, Education has been provided regarding the importance of this vaccine but patient still declined. Advised may receive this vaccine at local pharmacy or Health Dept.or vaccine clinic. Aware to provide a copy of the vaccination record if obtained from local pharmacy or Health Dept. Verbalized acceptance and understanding.  Qualifies for Shingles Vaccine? Yes   Zostavax completed Yes   Shingrix Completed?: Yes  Screening Tests Health Maintenance  Topic Date Due   COVID-19 Vaccine (1) Never done   OPHTHALMOLOGY EXAM  Never done   DTaP/Tdap/Td (1 - Tdap) Never done   INFLUENZA VACCINE  Never done   Diabetic kidney evaluation - Urine ACR  08/01/2023   HEMOGLOBIN A1C  08/19/2023   FOOT EXAM  11/17/2023   Diabetic kidney evaluation - eGFR measurement  02/03/2024   Medicare Annual Wellness (AWV)  03/08/2024   Pneumonia Vaccine 74+ Years old  Completed   DEXA SCAN  Completed   Zoster Vaccines- Shingrix  Completed   HPV VACCINES  Aged Out    Health Maintenance  Health Maintenance Due  Topic Date Due   COVID-19 Vaccine (1) Never done   OPHTHALMOLOGY EXAM  Never done   DTaP/Tdap/Td (1 - Tdap) Never done   INFLUENZA VACCINE  Never done        Bone Density status: Completed  05/06/21. Results reflect: Bone density results: OSTEOPENIA. Repeat every   years.   Additional Screening:    Vision Screening: Recommended annual ophthalmology exams for early detection of glaucoma and other disorders of the eye. Is the patient up to date with their annual eye exam?  Yes  Who is the provider or what is the name of the office in which the patient attends annual eye exams? Dr Nedra Hai If pt is not established with a provider, would they like to be referred to a provider to establish care? No .   Dental Screening: Recommended annual dental exams for proper oral hygiene  Diabetic Foot Exam: Diabetic Foot Exam: Completed 11/17/22  Community Resource Referral / Chronic Care Management:  CRR required this visit?  No   CCM required this visit?  No     Plan:     I have personally reviewed and noted the following in the patient's chart:   Medical and social  history Use of alcohol, tobacco or illicit drugs  Current medications and supplements including opioid prescriptions. Patient is not currently taking opioid prescriptions. Functional ability and status Nutritional status Physical activity Advanced directives List of other physicians Hospitalizations, surgeries, and ER visits in previous 12 months Vitals Screenings to include cognitive, depression, and falls Referrals and appointments  In addition, I have reviewed and discussed with patient certain preventive protocols, quality metrics, and best practice recommendations. A written personalized care plan for preventive services as well as general preventive health recommendations were provided to patient.     Tillie Rung, LPN   09/28/8117   After Visit Summary: (MyChart) Due to this being a telephonic visit, the after visit summary with patients personalized plan was offered to patient via MyChart   Nurse Notes: None

## 2023-03-09 NOTE — Patient Instructions (Addendum)
Tina Hansen , Thank you for taking time to come for your Medicare Wellness Visit. I appreciate your ongoing commitment to your health goals. Please review the following plan we discussed and let me know if I can assist you in the future.   Referrals/Orders/Follow-Ups/Clinician Recommendations:    This is a list of the screening recommended for you and due dates:  Health Maintenance  Topic Date Due   COVID-19 Vaccine (1) Never done   Eye exam for diabetics  Never done   DTaP/Tdap/Td vaccine (1 - Tdap) Never done   Flu Shot  Never done   Yearly kidney health urinalysis for diabetes  08/01/2023   Hemoglobin A1C  08/19/2023   Complete foot exam   11/17/2023   Yearly kidney function blood test for diabetes  02/03/2024   Medicare Annual Wellness Visit  03/08/2024   Pneumonia Vaccine  Completed   DEXA scan (bone density measurement)  Completed   Zoster (Shingles) Vaccine  Completed   HPV Vaccine  Aged Out    Advanced directives: (Declined) Advance directive discussed with you today. Even though you declined this today, please call our office should you change your mind, and we can give you the proper paperwork for you to fill out.  Next Medicare Annual Wellness Visit scheduled for next year: Yes

## 2023-03-19 NOTE — Progress Notes (Signed)
Medical screening examination/treatment was performed by qualified clinical staff member and as primary care provider I was immediately available for consultation/collaboration. I have reviewed documentation and agree with assessment and plan.  Melida Quitter, PA

## 2023-03-30 ENCOUNTER — Other Ambulatory Visit: Payer: Self-pay | Admitting: Family Medicine

## 2023-03-30 DIAGNOSIS — I10 Essential (primary) hypertension: Secondary | ICD-10-CM

## 2023-03-30 NOTE — Progress Notes (Unsigned)
Office Visit Note  Patient: Tina Hansen             Date of Birth: [birth year]           MRN: [id number]             PCP: Sandre Kitty, MD Referring: No ref. provider found Visit Date: 04/13/2023 Occupation: @GUAROCC @  Subjective:  Medication monitoring   History of Present Illness: Tina Hansen is a 82 y.o. female with history of seropositive rheumatoid arthritis and osteoarthritis.  Patient remains on Arava 10 mg alternating with 20 mg every other day.  She is tolerating Arava without any side effects and has not missed any doses recently.  She denies any signs or symptoms of a rheumatoid arthritis flare.  She has not been experiencing any morning stiffness, nocturnal pain, or difficulty with ADLs.  She denies any recent falls.  She denies any recurrent infections.  She denies any new medical conditions.    Activities of Daily Living:  Patient reports morning stiffness for 0 minutes.   Patient Denies nocturnal pain.  Difficulty dressing/grooming: Denies Difficulty climbing stairs: Denies Difficulty getting out of chair: Denies Difficulty using hands for taps, buttons, cutlery, and/or writing: Denies  Review of Systems  Constitutional:  Negative for fatigue.  HENT:  Positive for mouth dryness. Negative for mouth sores.   Eyes:  Negative for dryness.  Respiratory:  Positive for shortness of breath. Negative for cough and wheezing.   Cardiovascular:  Negative for chest pain and palpitations.  Gastrointestinal:  Negative for blood in stool, constipation and diarrhea.  Endocrine: Negative for increased urination.  Genitourinary:  Positive for involuntary urination.  Musculoskeletal:  Negative for joint pain, gait problem, joint pain, joint swelling, myalgias, muscle weakness, morning stiffness, muscle tenderness and myalgias.  Skin:  Positive for hair loss. Negative for color change, rash and sensitivity to sunlight.  Allergic/Immunologic: Negative for susceptible to  infections.  Neurological:  Positive for dizziness. Negative for headaches.  Hematological:  Negative for swollen glands.  Psychiatric/Behavioral:  Positive for sleep disturbance. Negative for depressed mood. The patient is not nervous/anxious.     PMFS History:  Patient Active Problem List   Diagnosis Date Noted   Vertigo 02/16/2023   Urinary frequency 02/16/2023   Vitamin D deficiency 09/10/2022   Pain 09/25/2019   Fatigue 09/25/2019   Statin declined 08/21/2019   Non compliance with medical treatment 08/21/2019   Uncontrolled type 2 diabetes mellitus with hyperglycemia (HCC) 11/07/2018   BMI 40.0-44.9, adult (HCC) 08/08/2018   Onychomycosis 04/06/2018   Decreased GFR 01/05/2018   Lipoma of left upper extremity 08/09/2017   COPD mixed type (HCC) 06/28/2017   Osteopenia of multiple sites 06/26/2016   Class 3 obesity with alveolar hypoventilation, serious comorbidity, and body mass index (BMI) of 45.0 to 49.9 in adult (HCC) 05/04/2016   High risk medication use 04/25/2016   Seropositive rheumatoid arthritis of multiple sites (HCC) 04/29/2015   Hyperlipidemia 10/06/2007   Dyspnea on exertion 10/06/2007   CERVICAL CANCER, HX OF 10/06/2007   Vocal cord dysfunction 10/06/2007   Hypertension associated with type 2 diabetes mellitus (HCC) 10/06/2007    Past Medical History:  Diagnosis Date   Asthma    Cervical cancer (HCC)    Diabetes (HCC)    Emphysema lung (HCC)    Hyperlipidemia    Hypertension    Rheumatoid arthritis(714.0)    [name]    Family History  Problem Relation Age of Onset   Asthma  Mother    Breast cancer Mother    Cancer Mother    Alcohol abuse Father    Diabetes Son    Cancer Maternal Uncle    Heart attack Maternal Uncle    Past Surgical History:  Procedure Laterality Date   ABDOMINAL HYSTERECTOMY     BREAST SURGERY     Breat implants     LIPOMA EXCISION Left    shoulder    TUBAL LIGATION     TUMOR REMOVAL Right    FOREARM   Social History    Social History Narrative   Not on file   Immunization History  Administered Date(s) Administered   Pneumococcal Conjugate-13 04/06/2018   Pneumococcal Polysaccharide-23 06/27/2007, 03/22/2009   Zoster Recombinant(Shingrix) 05/11/2019, 07/14/2019   Zoster, Live 05/11/2019     Objective: Vital Signs: BP (!) 163/83 (BP Location: Left Arm, Patient Position: Sitting, Cuff Size: Normal) Comment (BP Location): forearm  Pulse 91   Resp 17   Ht 5' 6.75" (1.695 m)   Wt 236 lb 6.4 oz (107.2 kg)   BMI 37.30 kg/m    Physical Exam Vitals and nursing note reviewed.  Constitutional:      Appearance: She is well-developed.  HENT:     Head: Normocephalic and atraumatic.  Eyes:     Conjunctiva/sclera: Conjunctivae normal.  Cardiovascular:     Rate and Rhythm: Normal rate and regular rhythm.     Heart sounds: Normal heart sounds.  Pulmonary:     Effort: Pulmonary effort is normal.     Breath sounds: Normal breath sounds.  Abdominal:     General: Bowel sounds are normal.     Palpations: Abdomen is soft.  Musculoskeletal:     Cervical back: Normal range of motion.  Lymphadenopathy:     Cervical: No cervical adenopathy.  Skin:    General: Skin is warm and dry.     Capillary Refill: Capillary refill takes less than 2 seconds.  Neurological:     Mental Status: She is alert and oriented to person, place, and time.  Psychiatric:        Behavior: Behavior normal.      Musculoskeletal Exam: C-spine has good range of motion.  Thoracic kyphosis noted.  Shoulder joints, elbow joints, wrist joints, MCPs, PIPs, DIPs have good range of motion with no synovitis.  CMC, PIPs, and DIP thickening noted. Complete fist formation bilaterally. Hip joints have good ROM with no groin pain.  Knee joints have good ROM with no warmth or effusion.  Ankle joints have good ROM with no tenderness or joint swelling.    CDAI Exam: CDAI Score: -- Patient Global: 10 / 100; Provider Global: 10 / 100 Swollen: --;  Tender: -- Joint Exam 04/13/2023   No joint exam has been documented for this visit   There is currently no information documented on the homunculus. Go to the Rheumatology activity and complete the homunculus joint exam.  Investigation: No additional findings.  Imaging: No results found.  Recent Labs: Lab Results  Component Value Date   WBC 6.7 02/03/2023   HGB 13.1 02/03/2023   PLT 234 02/03/2023   NA 139 02/03/2023   K 4.7 02/03/2023   CL 106 02/03/2023   CO2 24 02/03/2023   GLUCOSE 150 (H) 02/03/2023   BUN 17 02/03/2023   CREATININE 0.82 02/03/2023   BILITOT 0.5 02/03/2023   ALKPHOS 63 07/31/2022   AST 13 02/03/2023   ALT 16 02/03/2023   PROT 6.7 02/03/2023   ALBUMIN 4.4  07/31/2022   CALCIUM 9.5 02/03/2023   GFRAA 76 10/22/2020    Speciality Comments: Methotrexate discontinued due to elevated creatinine.  Arava start date February 06, 2020  Procedures:  No procedures performed Allergies: Other and Theophylline    Assessment / Plan:     Visit Diagnoses: Seropositive rheumatoid arthritis of multiple sites (HCC) - +RF, +CCP, h/o elevated sed rate: She has no joint tenderness or synovitis on examination today.  She has not had any signs or symptoms of a rheumatoid arthritis flare.  She has clinically been doing well on Arava 10 mg alternating with 20 mg every other day.  She continues to tolerate Arava without any side effects.  She has not had any interruptions in therapy.  No recurrent infections.  She has not been experiencing any morning stiffness, nocturnal pain, or difficulty with ADLs.  She will remain on Arava as monotherapy.  She was advised to notify us if she develops signs or symptoms of a flare.  She will follow up in 5 months or sooner if needed.   High risk medication use - Arava 10 mg alternating with 20 mg every other day.  CBC and CMP updated 02/03/23.  Orders for CBC and CMP were released today.  Her next lab work will be due in January and every 3  months to monitor for drug toxicity.  Standing orders for CBC and CMP are in place. No recurrent infections.  Discussed the importance of holding arava if she develops signs or symptoms of an infection and to resume once the infection has completely cleared.  - Plan: CBC with Differential/Platelet, COMPLETE METABOLIC PANEL WITH GFR  Primary osteoarthritis of both hands: No tenderness or synovitis noted on examination today.  Complete fist formation bilaterally.  Discussed the importance of joint protection and muscle strengthening.  Osteopenia of multiple sites - DEXA 05/06/2021: The BMD measured at Forearm Radius 33% is 0.739 g/cm2 with a T-score of -1.7.  She is taking a calcium and vitamin D supplement daily. DEXA due in November 2024--bre[facility].   Vitamin D deficiency: She is taking a calcium and vitamin D supplement daily.  Other medical conditions are listed as follows:   History of diabetes mellitus  History of hypertension: Blood pressure was elevated today in the office and was rechecked prior to leaving.  Patient was advised to monitor blood pressure closely and to reach out to PCP if it remains elevated.  History of COPD  History of hyperlipidemia  Vertigo  Neuropathy  Vocal cord dysfunction  History of recent fall  Trochanteric bursitis, right hip: Not currently symptomatic.   Orders: Orders Placed This Encounter  Procedures   CBC with Differential/Platelet   COMPLETE METABOLIC PANEL WITH GFR   No orders of the defined types were placed in this encounter.    Follow-Up Instructions: Return in about 5 months (around 09/11/2023) for Rheumatoid arthritis.   Gearldine Bienenstock, PA-C  Note - This record has been created using Dragon software.  Chart creation errors have been sought, but may not always  have been located. Such creation errors do not reflect on  the standard of medical care.

## 2023-04-13 ENCOUNTER — Encounter: Payer: Self-pay | Admitting: Physician Assistant

## 2023-04-13 ENCOUNTER — Ambulatory Visit: Payer: 59 | Attending: Physician Assistant | Admitting: Physician Assistant

## 2023-04-13 VITALS — BP 163/83 | HR 91 | Resp 17 | Ht 66.75 in | Wt 236.4 lb

## 2023-04-13 DIAGNOSIS — M19041 Primary osteoarthritis, right hand: Secondary | ICD-10-CM | POA: Diagnosis not present

## 2023-04-13 DIAGNOSIS — M0579 Rheumatoid arthritis with rheumatoid factor of multiple sites without organ or systems involvement: Secondary | ICD-10-CM

## 2023-04-13 DIAGNOSIS — R42 Dizziness and giddiness: Secondary | ICD-10-CM | POA: Diagnosis not present

## 2023-04-13 DIAGNOSIS — Z8639 Personal history of other endocrine, nutritional and metabolic disease: Secondary | ICD-10-CM | POA: Diagnosis not present

## 2023-04-13 DIAGNOSIS — G629 Polyneuropathy, unspecified: Secondary | ICD-10-CM | POA: Diagnosis not present

## 2023-04-13 DIAGNOSIS — Z8679 Personal history of other diseases of the circulatory system: Secondary | ICD-10-CM

## 2023-04-13 DIAGNOSIS — E559 Vitamin D deficiency, unspecified: Secondary | ICD-10-CM | POA: Diagnosis not present

## 2023-04-13 DIAGNOSIS — Z79899 Other long term (current) drug therapy: Secondary | ICD-10-CM | POA: Diagnosis not present

## 2023-04-13 DIAGNOSIS — M8589 Other specified disorders of bone density and structure, multiple sites: Secondary | ICD-10-CM

## 2023-04-13 DIAGNOSIS — M7061 Trochanteric bursitis, right hip: Secondary | ICD-10-CM

## 2023-04-13 DIAGNOSIS — Z8709 Personal history of other diseases of the respiratory system: Secondary | ICD-10-CM

## 2023-04-13 DIAGNOSIS — J383 Other diseases of vocal cords: Secondary | ICD-10-CM | POA: Diagnosis not present

## 2023-04-13 DIAGNOSIS — Z9181 History of falling: Secondary | ICD-10-CM | POA: Diagnosis not present

## 2023-04-13 DIAGNOSIS — M19042 Primary osteoarthritis, left hand: Secondary | ICD-10-CM

## 2023-04-13 NOTE — Patient Instructions (Signed)

## 2023-04-14 LAB — COMPLETE METABOLIC PANEL WITH GFR
AG Ratio: 1.6 (calc) (ref 1.0–2.5)
ALT: 17 U/L (ref 6–29)
AST: 19 U/L (ref 10–35)
Albumin: 4.1 g/dL (ref 3.6–5.1)
Alkaline phosphatase (APISO): 62 U/L (ref 37–153)
BUN: 17 mg/dL (ref 7–25)
CO2: 27 mmol/L (ref 20–32)
Calcium: 10.4 mg/dL (ref 8.6–10.4)
Chloride: 102 mmol/L (ref 98–110)
Creat: 0.86 mg/dL (ref 0.60–0.95)
Globulin: 2.6 g/dL (ref 1.9–3.7)
Glucose, Bld: 216 mg/dL — ABNORMAL HIGH (ref 65–99)
Potassium: 5 mmol/L (ref 3.5–5.3)
Sodium: 137 mmol/L (ref 135–146)
Total Bilirubin: 0.6 mg/dL (ref 0.2–1.2)
Total Protein: 6.7 g/dL (ref 6.1–8.1)
eGFR: 67 mL/min/{1.73_m2} (ref >=60)

## 2023-04-14 LAB — CBC WITH DIFFERENTIAL/PLATELET
Absolute Lymphocytes: 935 {cells}/uL (ref 850–3900)
Absolute Monocytes: 678 {cells}/uL (ref 200–950)
Basophils Absolute: 67 {cells}/uL (ref 0–200)
Basophils Relative: 1.2 %
Eosinophils Absolute: 269 {cells}/uL (ref 15–500)
Eosinophils Relative: 4.8 %
HCT: 43.8 % (ref 35.0–45.0)
Hemoglobin: 14.2 g/dL (ref 11.7–15.5)
MCH: 28.6 pg (ref 27.0–33.0)
MCHC: 32.4 g/dL (ref 32.0–36.0)
MCV: 88.1 fL (ref 80.0–100.0)
MPV: 10.4 fL (ref 7.5–12.5)
Monocytes Relative: 12.1 %
Neutro Abs: 3651 {cells}/uL (ref 1500–7800)
Neutrophils Relative %: 65.2 %
Platelets: 234 10*3/uL (ref 140–400)
RBC: 4.97 10*6/uL (ref 3.80–5.10)
RDW: 12.5 % (ref 11.0–15.0)
Total Lymphocyte: 16.7 %
WBC: 5.6 10*3/uL (ref 3.8–10.8)

## 2023-04-14 NOTE — Progress Notes (Signed)
Glucose is elevated-216. Rest of CMP WNL. CBC WNL.

## 2023-05-05 ENCOUNTER — Other Ambulatory Visit: Payer: Self-pay | Admitting: Family Medicine

## 2023-05-05 DIAGNOSIS — E1165 Type 2 diabetes mellitus with hyperglycemia: Secondary | ICD-10-CM

## 2023-05-17 ENCOUNTER — Other Ambulatory Visit: Payer: Self-pay

## 2023-05-17 DIAGNOSIS — E785 Hyperlipidemia, unspecified: Secondary | ICD-10-CM

## 2023-05-17 MED ORDER — VALSARTAN 160 MG PO TABS
320.0000 mg | ORAL_TABLET | Freq: Every day | ORAL | 0 refills | Status: DC
Start: 2023-05-17 — End: 2023-08-26

## 2023-05-25 ENCOUNTER — Ambulatory Visit (INDEPENDENT_AMBULATORY_CARE_PROVIDER_SITE_OTHER): Payer: 59 | Admitting: Family Medicine

## 2023-05-25 ENCOUNTER — Encounter: Payer: Self-pay | Admitting: Family Medicine

## 2023-05-25 VITALS — BP 128/76 | HR 86 | Ht 66.75 in | Wt 240.8 lb

## 2023-05-25 DIAGNOSIS — R42 Dizziness and giddiness: Secondary | ICD-10-CM

## 2023-05-25 DIAGNOSIS — E1165 Type 2 diabetes mellitus with hyperglycemia: Secondary | ICD-10-CM

## 2023-05-25 DIAGNOSIS — Z7985 Long-term (current) use of injectable non-insulin antidiabetic drugs: Secondary | ICD-10-CM

## 2023-05-25 LAB — POCT GLYCOSYLATED HEMOGLOBIN (HGB A1C): HbA1c POC (<> result, manual entry): 7.2 % (ref 4.0–5.6)

## 2023-05-25 MED ORDER — EMPAGLIFLOZIN 10 MG PO TABS: 10.0000 mg | ORAL_TABLET | Freq: Every day | ORAL | 3 refills | Status: DC

## 2023-05-25 NOTE — Progress Notes (Unsigned)
   Established Patient Office Visit  Subjective   Patient ID: Tina Hansen, female    DOB: 1941/02/02  Age: 82 y.o. MRN: [id number]  Chief Complaint  Patient presents with   Medical Management of Chronic Issues    HPI  DM2-patient taking 1 mg of Ozempic.  Tolerates it well.  Is hesitant to increase her dose.   Discussed wanting to get her A1c under 7% .  Patient agreeable to switching glipizide to Jardiance.  Vertigo-patient still has issues with vertigo.  She still declines physical therapy.  We again discussed her MRI findings that indicated a likely stroke in the internal capsule.  The ASCVD Risk score (Arnett DK, et al., 2019) failed to calculate for the following reasons:   The 2019 ASCVD risk score is only valid for ages 12 to 38  Health Maintenance Due  Topic Date Due   COVID-19 Vaccine (1) Never done   OPHTHALMOLOGY EXAM  Never done   DTaP/Tdap/Td (1 - Tdap) Never done   INFLUENZA VACCINE  Never done      Objective:     BP 128/76   Pulse 86   Ht 5' 6.75" (1.695 m)   Wt 240 lb 12.8 oz (109.2 kg)   SpO2 95%   BMI 38.00 kg/m    Physical Exam General: Alert, oriented CV: Regular rate rhythm Pulmonary: Clear bilaterally Psych: Pleasant affect.   Results for orders placed or performed in visit on 05/25/23  POCT HgB A1C  Result Value Ref Range   Hemoglobin A1C     HbA1c POC (<> result, manual entry) 7.2 4.0 - 5.6 %   HbA1c, POC (prediabetic range)     HbA1c, POC (controlled diabetic range)          Assessment & Plan:   Uncontrolled type 2 diabetes mellitus with hyperglycemia (HCC) Assessment & Plan: A1c went from 7.4% to 7.2%. - Patient has been hesitant to increase Ozempic. - Will start Jardiance - Will stop glipizide - Follow-up 3 months  Orders: -     POCT glycosylated hemoglobin (Hb A1C)  Vertigo Assessment & Plan: Still endorses dizziness.  Again declines physical therapy.  Most likely caused by the infarct seen on the internal  capsule.     Other orders -     Empagliflozin; Take 1 tablet (10 mg total) by mouth daily before breakfast.  Dispense: 30 tablet; Refill: 3     Return in about 3 months (around 08/23/2023) for DM.    Sandre Kitty, MD

## 2023-05-25 NOTE — Assessment & Plan Note (Signed)
A1c went from 7.4% to 7.2%. - Patient has been to increase Ozempic. - Will start Jardiance - Will stop glipizide - Follow-up 3 months

## 2023-05-25 NOTE — Assessment & Plan Note (Signed)
Still endorses dizziness.  Again declines physical therapy.  Most likely caused by the infarct seen on the internal capsule.

## 2023-05-25 NOTE — Patient Instructions (Signed)
It was nice to see you today,  We addressed the following topics today: -I have added a medication called Jardiance for your diabetes.  Take this once a day. - I have stopped your glipizide. - We will follow-up in 3 months and adjusted as needed.  Have a great day,  Frederic Jericho, MD

## 2023-05-28 ENCOUNTER — Other Ambulatory Visit: Payer: Self-pay | Admitting: Physician Assistant

## 2023-05-28 NOTE — Telephone Encounter (Signed)
Last Fill: 02/24/2023  Labs: 04/13/2023 Glucose is elevated-216. Rest of CMP WNL. CBC WNL.     Next Visit: 09/15/2023  Last Visit: 04/13/2023  DX: Seropositive rheumatoid arthritis of multiple sites   Current Dose per office note 04/13/2023: Arava 10 mg alternating with 20 mg every other day.   Okay to refill Arava ?

## 2023-06-11 ENCOUNTER — Other Ambulatory Visit: Payer: Self-pay | Admitting: Family Medicine

## 2023-06-11 DIAGNOSIS — E1165 Type 2 diabetes mellitus with hyperglycemia: Secondary | ICD-10-CM

## 2023-06-14 ENCOUNTER — Other Ambulatory Visit: Payer: Self-pay | Admitting: Family Medicine

## 2023-06-14 DIAGNOSIS — I10 Essential (primary) hypertension: Secondary | ICD-10-CM

## 2023-06-21 ENCOUNTER — Telehealth: Payer: Self-pay

## 2023-06-21 NOTE — Telephone Encounter (Signed)
Patient contacted the office to inquire if we were drawing labs today. Advised the patient of our lab hours. After reviewing the patient's chart, the patient is on leflunomide and her last set of labs was drawn on 04/13/2023. Advised the patient she would not need labs done until closer to 07/14/2023. Patient verbalized understanding.

## 2023-07-20 ENCOUNTER — Other Ambulatory Visit: Payer: Self-pay | Admitting: *Deleted

## 2023-07-20 DIAGNOSIS — Z79899 Other long term (current) drug therapy: Secondary | ICD-10-CM | POA: Diagnosis not present

## 2023-07-21 LAB — CBC WITH DIFFERENTIAL/PLATELET
Absolute Lymphocytes: 1387 {cells}/uL (ref 850–3900)
Absolute Monocytes: 748 {cells}/uL (ref 200–950)
Basophils Absolute: 88 {cells}/uL (ref 0–200)
Basophils Relative: 1.3 %
Eosinophils Absolute: 170 {cells}/uL (ref 15–500)
Eosinophils Relative: 2.5 %
HCT: 45.3 % — ABNORMAL HIGH (ref 35.0–45.0)
Hemoglobin: 14.7 g/dL (ref 11.7–15.5)
MCH: 28.5 pg (ref 27.0–33.0)
MCHC: 32.5 g/dL (ref 32.0–36.0)
MCV: 87.8 fL (ref 80.0–100.0)
MPV: 11 fL (ref 7.5–12.5)
Monocytes Relative: 11 %
Neutro Abs: 4406 {cells}/uL (ref 1500–7800)
Neutrophils Relative %: 64.8 %
Platelets: 229 10*3/uL (ref 140–400)
RBC: 5.16 10*6/uL — ABNORMAL HIGH (ref 3.80–5.10)
RDW: 13.2 % (ref 11.0–15.0)
Total Lymphocyte: 20.4 %
WBC: 6.8 10*3/uL (ref 3.8–10.8)

## 2023-07-21 LAB — COMPLETE METABOLIC PANEL WITH GFR
AG Ratio: 1.6 (calc) (ref 1.0–2.5)
ALT: 16 U/L (ref 6–29)
AST: 18 U/L (ref 10–35)
Albumin: 4.1 g/dL (ref 3.6–5.1)
Alkaline phosphatase (APISO): 56 U/L (ref 37–153)
BUN: 14 mg/dL (ref 7–25)
CO2: 23 mmol/L (ref 20–32)
Calcium: 9.6 mg/dL (ref 8.6–10.4)
Chloride: 103 mmol/L (ref 98–110)
Creat: 0.95 mg/dL (ref 0.60–0.95)
Globulin: 2.5 g/dL (ref 1.9–3.7)
Glucose, Bld: 194 mg/dL — ABNORMAL HIGH (ref 65–99)
Potassium: 4.5 mmol/L (ref 3.5–5.3)
Sodium: 138 mmol/L (ref 135–146)
Total Bilirubin: 0.6 mg/dL (ref 0.2–1.2)
Total Protein: 6.6 g/dL (ref 6.1–8.1)
eGFR: 60 mL/min/{1.73_m2} (ref >=60)

## 2023-07-21 NOTE — Progress Notes (Signed)
CBC and CMP normal except glucose remains elevated at 194.

## 2023-07-22 ENCOUNTER — Other Ambulatory Visit: Payer: Self-pay | Admitting: Family Medicine

## 2023-07-22 DIAGNOSIS — I10 Essential (primary) hypertension: Secondary | ICD-10-CM

## 2023-07-22 DIAGNOSIS — E1165 Type 2 diabetes mellitus with hyperglycemia: Secondary | ICD-10-CM

## 2023-08-18 ENCOUNTER — Other Ambulatory Visit: Payer: Self-pay | Admitting: Family Medicine

## 2023-08-18 DIAGNOSIS — E1165 Type 2 diabetes mellitus with hyperglycemia: Secondary | ICD-10-CM

## 2023-08-23 ENCOUNTER — Encounter: Payer: Self-pay | Admitting: Family Medicine

## 2023-08-23 ENCOUNTER — Ambulatory Visit (INDEPENDENT_AMBULATORY_CARE_PROVIDER_SITE_OTHER): Payer: 59 | Admitting: Family Medicine

## 2023-08-23 VITALS — BP 139/69 | HR 93 | Ht 66.75 in | Wt 221.1 lb

## 2023-08-23 DIAGNOSIS — M7021 Olecranon bursitis, right elbow: Secondary | ICD-10-CM | POA: Diagnosis not present

## 2023-08-23 DIAGNOSIS — R350 Frequency of micturition: Secondary | ICD-10-CM

## 2023-08-23 DIAGNOSIS — Z7984 Long term (current) use of oral hypoglycemic drugs: Secondary | ICD-10-CM

## 2023-08-23 DIAGNOSIS — E1165 Type 2 diabetes mellitus with hyperglycemia: Secondary | ICD-10-CM

## 2023-08-23 LAB — POCT GLYCOSYLATED HEMOGLOBIN (HGB A1C): HbA1c POC (<> result, manual entry): 8.1 % (ref 4.0–5.6)

## 2023-08-23 MED ORDER — EMPAGLIFLOZIN 25 MG PO TABS: 25.0000 mg | ORAL_TABLET | Freq: Every day | ORAL | 3 refills | Status: AC

## 2023-08-23 NOTE — Progress Notes (Signed)
 Established Patient Office Visit  Subjective   Patient ID: Tina Hansen, female    DOB: 30-Mar-1941  Age: 83 y.o. MRN: [id number]  Chief Complaint  Patient presents with   Medical Management of Chronic Issues    HPI  Dm2 -is taking Ozempic.  She is taking Jardiance 10 mg.  She is not taking the glipizide as we discussed at her last visit. .  Patient states that about 1 month ago she got up from her recliner to go exercise and her legs locked up and felt like they were hyperextending.  She did feel like she could not move her right leg.  She stopped and braced herself.  She should count her legs and after a few minutes the right leg returned to normal and she was able to walk again.  No further issues.  Patient bumped her right elbow and recently noticed that her right elbow is swollen.  It is not painful.  Not changing in size.  Patient has had several years of issues with urinary dribbling.  She says she has tried exercises such as pelvic floor exercises in the past but the effects do not last long.  Does not have a urologist.   The ASCVD Risk score (Arnett DK, et al., 2019) failed to calculate for the following reasons:   The 2019 ASCVD risk score is only valid for ages 50 to 8  Health Maintenance Due  Topic Date Due   COVID-19 Vaccine (1) Never done   OPHTHALMOLOGY EXAM  Never done   DTaP/Tdap/Td (1 - Tdap) Never done   INFLUENZA VACCINE  Never done   Diabetic kidney evaluation - Urine ACR  08/01/2023      Objective:     BP 139/69   Pulse 93   Ht 5' 6.75" (1.695 m)   Wt 221 lb 1.9 oz (100.3 kg)   SpO2 95%   BMI 34.89 kg/m    Physical Exam General: Alert, oriented Pulmonary: No respiratory distress MSK: Normal gait.  Fluctuant swelling on the posterior right elbow.  Nontender.  No rash.   No results found for any visits on 08/23/23.      Assessment & Plan:   Uncontrolled type 2 diabetes mellitus with hyperglycemia (HCC) Assessment & Plan: At last  visit we stopped glipizide and started Jardiance 10 mg.  A1c went from 7.2-8.1.  Increasing Jardiance today.  Patient hesitant again to increase her Ozempic.  Getting UACR today.  Check again in 3 months.  Increasing Jardiance to 25 mg.  Orders: -     POCT glycosylated hemoglobin (Hb A1C) -     Microalbumin / creatinine urine ratio  Urinary frequency Assessment & Plan: Has done Kegel exercises in the past.  Does not have a urologist.  We talked about potential medication options such as Vesicare and Myrbetriq.  Provided patient with information on these.  Advised her to let us know if she would like to try a medication or if she would like referral to pelvic floor physical therapy or urology.   Olecranon bursitis of right elbow Assessment & Plan: Does not appear to be infected.  Discussed management with compression sleeve or aspiration if needed.  Advised patient to let us know if she would like referral for aspiration.  Discussed reasons to seek medical attention including severe worsening pain, redness and increased swelling or signs concerning for infection..   Other orders -     Empagliflozin; Take 1 tablet (25 mg total) by mouth  daily before breakfast.  Dispense: 90 tablet; Refill: 3     Return in about 3 months (around 11/23/2023) for DM.    Sandre Kitty, MD

## 2023-08-23 NOTE — Assessment & Plan Note (Signed)
 At last visit we stopped glipizide and started Jardiance 10 mg.  A1c went from 7.2-8.1.  Increasing Jardiance today.  Patient hesitant again to increase her Ozempic.  Getting UACR today.  Check again in 3 months.  Increasing Jardiance to 25 mg.

## 2023-08-23 NOTE — Assessment & Plan Note (Signed)
 Does not appear to be infected.  Discussed management with compression sleeve or aspiration if needed.  Advised patient to let us know if she would like referral for aspiration.  Discussed reasons to seek medical attention including severe worsening pain, redness and increased swelling or signs concerning for infection.Tina Hansen

## 2023-08-23 NOTE — Patient Instructions (Addendum)
 It was nice to see you today,  We addressed the following topics today: Your A1c is 8.1.  I am going to increase your Jardiance to 25 mg.  You can use the rest of the Jardiance you have by taking 2 of the 10 mg a day but this is not quite as strong as the 25 mg. - Your right elbow is caused by something called olecranon bursitis.  This is a swelling of your bursa sac.  It will likely go away over time on its own but wearing a compression elbow sleeve could help speed that up.  If it is not going away or if it is getting larger and you want the fluid removed it can be drained by a orthopedist - For urinary dribbling or stress incontinence there are multiple medication options you can try.  2 of these medications with less side effects include Vesicare and Myrbetriq.  I have provided information on both of these.  If you would like a referral to a urologist let me know.  Pelvic floor physical therapist can also help with the symptoms.   Have a great day,  Frederic Jericho, MD

## 2023-08-23 NOTE — Assessment & Plan Note (Signed)
 Has done Kegel exercises in the past.  Does not have a urologist.  We talked about potential medication options such as Vesicare and Myrbetriq.  Provided patient with information on these.  Advised her to let us know if she would like to try a medication or if she would like referral to pelvic floor physical therapy or urology.

## 2023-08-24 LAB — MICROALBUMIN / CREATININE URINE RATIO
Creatinine, Urine: 91.4 mg/dL
Microalb/Creat Ratio: 48 mg/g{creat} — ABNORMAL HIGH (ref 0–29)
Microalbumin, Urine: 44.1 ug/mL

## 2023-08-25 ENCOUNTER — Other Ambulatory Visit: Payer: Self-pay | Admitting: Rheumatology

## 2023-08-25 NOTE — Telephone Encounter (Signed)
 Last Fill: 05/28/2023  Labs: 07/20/2023 CBC and CMP normal except glucose remains elevated at 194.   Next Visit: 09/15/2023  Last Visit: 04/13/2023  DX: Seropositive rheumatoid arthritis of multiple sites   Current Dose per office note 04/13/2023: Arava 10 mg alternating with 20 mg every other day.   Okay to refill Arava ?

## 2023-08-26 ENCOUNTER — Other Ambulatory Visit: Payer: Self-pay | Admitting: Family Medicine

## 2023-08-26 DIAGNOSIS — E785 Hyperlipidemia, unspecified: Secondary | ICD-10-CM

## 2023-09-03 NOTE — Progress Notes (Signed)
 Office Visit Note  Patient: Tina Hansen             Date of Birth: 1940-12-17           MRN: [id number]             PCP: Sandre Kitty, MD Referring: Sandre Kitty, MD Visit Date: 09/15/2023 Occupation: @GUAROCC @  Subjective:  Medication management   History of Present Illness: Tina Hansen is a 83 y.o. female with seropositive rheumatoid arthritis, osteoarthritis and osteopenia.  As she has not had a flare of rheumatoid arthritis since the last visit.  She has been on leflunomide 20 mg alternating with 10 mg every other day which she has been tolerating well.  She states she has not had more diarrhea.  She denies any morning stiffness.    Activities of Daily Living:  Patient reports morning stiffness for 0 minute.   Patient Denies nocturnal pain.  Difficulty dressing/grooming: Denies Difficulty climbing stairs: Reports Difficulty getting out of chair: Denies Difficulty using hands for taps, buttons, cutlery, and/or writing: Reports  Review of Systems  Constitutional:  Negative for fatigue.  HENT:  Positive for mouth dryness. Negative for mouth sores.   Eyes:  Negative for dryness.  Respiratory:  Positive for shortness of breath.   Cardiovascular:  Negative for chest pain and palpitations.  Gastrointestinal:  Negative for blood in stool, constipation and diarrhea.  Endocrine: Positive for increased urination.  Genitourinary:  Negative for involuntary urination.  Musculoskeletal:  Positive for gait problem, myalgias and myalgias. Negative for joint pain, joint pain, joint swelling, muscle weakness, morning stiffness and muscle tenderness.  Skin:  Negative for color change, rash, hair loss and sensitivity to sunlight.  Allergic/Immunologic: Negative for susceptible to infections.  Neurological:  Positive for dizziness. Negative for headaches.  Hematological:  Negative for swollen glands.  Psychiatric/Behavioral:  Positive for sleep disturbance. Negative for  depressed mood. The patient is not nervous/anxious.     PMFS History:  Patient Active Problem List   Diagnosis Date Noted   Olecranon bursitis of right elbow 08/23/2023   Vertigo 02/16/2023   Urinary frequency 02/16/2023   Vitamin D deficiency 09/10/2022   Pain 09/25/2019   Fatigue 09/25/2019   Statin declined 08/21/2019   Non compliance with medical treatment 08/21/2019   Uncontrolled type 2 diabetes mellitus with hyperglycemia (HCC) 11/07/2018   BMI 40.0-44.9, adult (HCC) 08/08/2018   Onychomycosis 04/06/2018   Decreased GFR 01/05/2018   Lipoma of left upper extremity 08/09/2017   COPD mixed type (HCC) 06/28/2017   Osteopenia of multiple sites 06/26/2016   Class 3 obesity with alveolar hypoventilation, serious comorbidity, and body mass index (BMI) of 45.0 to 49.9 in adult (HCC) 05/04/2016   High risk medication use 04/25/2016   Seropositive rheumatoid arthritis of multiple sites (HCC) 04/29/2015   Hyperlipidemia 10/06/2007   Dyspnea on exertion 10/06/2007   CERVICAL CANCER, HX OF 10/06/2007   Vocal cord dysfunction 10/06/2007   Hypertension associated with type 2 diabetes mellitus (HCC) 10/06/2007    Past Medical History:  Diagnosis Date   Asthma    Cervical cancer (HCC)    Diabetes (HCC)    Emphysema lung (HCC)    Hyperlipidemia    Hypertension    Rheumatoid arthritis(714.0)    Tina Hansen   Vertigo     Family History  Problem Relation Age of Onset   Asthma Mother    Breast cancer Mother    Cancer Mother    Alcohol abuse Father  Diabetes Son    Cancer Maternal Uncle    Heart attack Maternal Uncle    Past Surgical History:  Procedure Laterality Date   ABDOMINAL HYSTERECTOMY     BREAST SURGERY     Breat implants     LIPOMA EXCISION Left    shoulder    TUBAL LIGATION     TUMOR REMOVAL Right    FOREARM   Social History   Social History Narrative   Not on file   Immunization History  Administered Date(s) Administered   Pneumococcal Conjugate-13  04/06/2018   Pneumococcal Polysaccharide-23 06/27/2007, 03/22/2009   Zoster Recombinant(Shingrix) 05/11/2019, 07/14/2019   Zoster, Live 05/11/2019     Objective: Vital Signs: BP 123/78 (BP Location: Left Wrist, Patient Position: Sitting, Cuff Size: Normal)   Pulse (!) 48   Resp 16   Ht 5' 6.75" (1.695 m)   Wt 218 lb (98.9 kg)   BMI 34.40 kg/m    Physical Exam Vitals and nursing note reviewed.  Constitutional:      Appearance: She is well-developed.  HENT:     Head: Normocephalic and atraumatic.  Eyes:     Conjunctiva/sclera: Conjunctivae normal.  Cardiovascular:     Rate and Rhythm: Normal rate and regular rhythm.     Heart sounds: Normal heart sounds.  Pulmonary:     Effort: Pulmonary effort is normal.     Breath sounds: Normal breath sounds.  Abdominal:     General: Bowel sounds are normal.     Palpations: Abdomen is soft.  Musculoskeletal:     Cervical back: Normal range of motion.  Lymphadenopathy:     Cervical: No cervical adenopathy.  Skin:    General: Skin is warm and dry.     Capillary Refill: Capillary refill takes less than 2 seconds.  Neurological:     Mental Status: She is alert and oriented to person, place, and time.  Psychiatric:        Behavior: Behavior normal.      Musculoskeletal Exam: She had good range of motion of the cervical spine.  Thoracic kyphosis was noted.  There was no tenderness over thoracic or lumbar spine.  Shoulders, elbows, wrist, MCPs PIPs and DIPs were in good range of motion without any synovitis.  Hip joints and knee joints in good range of motion without any warmth swelling or effusion.  There was no tenderness over ankles or MTPs.  CDAI Exam: CDAI Score: -- Patient Global: 0 / 100; Provider Global: 0 / 100 Swollen: --; Tender: -- Joint Exam 09/15/2023   No joint exam has been documented for this visit   There is currently no information documented on the homunculus. Go to the Rheumatology activity and complete the  homunculus joint exam.  Investigation: No additional findings.  Imaging: No results found.  Recent Labs: Lab Results  Component Value Date   WBC 7.1 09/13/2023   HGB 14.4 09/13/2023   PLT 235 09/13/2023   NA 137 09/13/2023   K 4.8 09/13/2023   CL 103 09/13/2023   CO2 22 09/13/2023   GLUCOSE 235 (H) 09/13/2023   BUN 22 09/13/2023   CREATININE 1.00 (H) 09/13/2023   BILITOT 0.6 09/13/2023   ALKPHOS 63 07/31/2022   AST 18 09/13/2023   ALT 20 09/13/2023   PROT 6.8 09/13/2023   ALBUMIN 4.4 07/31/2022   CALCIUM 9.7 09/13/2023   GFRAA 76 10/22/2020    Speciality Comments: Methotrexate discontinued due to elevated creatinine.  Arava start date February 06, 2020  Procedures:  No procedures performed Allergies: Other and Theophylline   Assessment / Plan:     Visit Diagnoses: Seropositive rheumatoid arthritis of multiple sites (HCC) - +RF, +CCP, h/o elevated sed rate: Patient denies having a flare of rheumatoid arthritis since the last visit.  She continues to take Areva 20 mg alternating with 10 mg without any side effects.  She denies any morning stiffness.  She has some difficulty climbing stairs and opening jars and bottles due to underlying arthritis.  No synovitis was noted on the examination today.  High risk medication use - Arava 10 mg alternating with 20 mg every other day.  Labs obtained on September 13, 2023 CBC and CMP were normal except glucose was elevated and creatinine was 1.0.  Will continue to monitor labs every 3 months.  Information on immunization was placed in the AVS. she was advised to hold Areva if she develops an infection resume after the infection resolves.  Primary osteoarthritis of both hands-she had bilateral PIP and DIP thickening.  No synovitis was noted.  Joint protection muscle strengthening was discussed.  Trochanteric bursitis, right hip-currently symptomatic.  IT band stretches were discussed.  Osteopenia of multiple sites - DEXA 05/06/2021: The BMD  measured at Forearm Radius 33% is 0.739 g/cm2 with a T-score of -1.7.  Use of calcium rich diet and vitamin D was discussed.  Need for regular exercise was emphasized.  Vitamin D deficiency-vitamin D was 17.6 on July 31, 2022.  Other medical problems are listed as follows:  History of diabetes mellitus  History of COPD  History of hypertension  History of hyperlipidemia-lipid panel was done July 31, 2022 with LDL 157.  She will get repeat lipid panel with her PCP.  Vertigo-patient can history of some dizziness.  She is not using a cane.  Need for using cane on a regular basis was emphasized.  Vocal cord dysfunction  Neuropathy    Orders: No orders of the defined types were placed in this encounter.  No orders of the defined types were placed in this encounter.    Follow-Up Instructions: Return in about 5 months (around 02/15/2024) for Rheumatoid arthritis.   Pollyann Savoy, MD  Note - This record has been created using Animal nutritionist.  Chart creation errors have been sought, but may not always  have been located. Such creation errors do not reflect on  the standard of medical care.

## 2023-09-13 ENCOUNTER — Other Ambulatory Visit: Payer: Self-pay | Admitting: *Deleted

## 2023-09-13 DIAGNOSIS — Z79899 Other long term (current) drug therapy: Secondary | ICD-10-CM

## 2023-09-14 LAB — COMPLETE METABOLIC PANEL WITH GFR
AG Ratio: 1.5 (calc) (ref 1.0–2.5)
ALT: 20 U/L (ref 6–29)
AST: 18 U/L (ref 10–35)
Albumin: 4.1 g/dL (ref 3.6–5.1)
Alkaline phosphatase (APISO): 62 U/L (ref 37–153)
BUN/Creatinine Ratio: 22 (calc) (ref 6–22)
BUN: 22 mg/dL (ref 7–25)
CO2: 22 mmol/L (ref 20–32)
Calcium: 9.7 mg/dL (ref 8.6–10.4)
Chloride: 103 mmol/L (ref 98–110)
Creat: 1 mg/dL — ABNORMAL HIGH (ref 0.60–0.95)
Globulin: 2.7 g/dL (ref 1.9–3.7)
Glucose, Bld: 235 mg/dL — ABNORMAL HIGH (ref 65–99)
Potassium: 4.8 mmol/L (ref 3.5–5.3)
Sodium: 137 mmol/L (ref 135–146)
Total Bilirubin: 0.6 mg/dL (ref 0.2–1.2)
Total Protein: 6.8 g/dL (ref 6.1–8.1)

## 2023-09-14 LAB — CBC WITH DIFFERENTIAL/PLATELET
Absolute Lymphocytes: 1292 {cells}/uL (ref 850–3900)
Absolute Monocytes: 710 {cells}/uL (ref 200–950)
Basophils Absolute: 71 {cells}/uL (ref 0–200)
Basophils Relative: 1 %
Eosinophils Absolute: 178 {cells}/uL (ref 15–500)
Eosinophils Relative: 2.5 %
HCT: 44.5 % (ref 35.0–45.0)
Hemoglobin: 14.4 g/dL (ref 11.7–15.5)
MCH: 28.6 pg (ref 27.0–33.0)
MCHC: 32.4 g/dL (ref 32.0–36.0)
MCV: 88.3 fL (ref 80.0–100.0)
MPV: 10.5 fL (ref 7.5–12.5)
Monocytes Relative: 10 %
Neutro Abs: 4849 {cells}/uL (ref 1500–7800)
Neutrophils Relative %: 68.3 %
Platelets: 235 10*3/uL (ref 140–400)
RBC: 5.04 10*6/uL (ref 3.80–5.10)
RDW: 13.2 % (ref 11.0–15.0)
Total Lymphocyte: 18.2 %
WBC: 7.1 10*3/uL (ref 3.8–10.8)

## 2023-09-14 NOTE — Progress Notes (Signed)
 Glucose is elevated.  Creatinine is mildly elevated.  CBC is normal.  Please notify patient and forward results to PCP.

## 2023-09-15 ENCOUNTER — Ambulatory Visit: Payer: 59 | Attending: Rheumatology | Admitting: Rheumatology

## 2023-09-15 ENCOUNTER — Encounter: Payer: Self-pay | Admitting: Rheumatology

## 2023-09-15 VITALS — BP 123/78 | HR 48 | Resp 16 | Ht 66.75 in | Wt 218.0 lb

## 2023-09-15 DIAGNOSIS — M19041 Primary osteoarthritis, right hand: Secondary | ICD-10-CM | POA: Diagnosis not present

## 2023-09-15 DIAGNOSIS — M7061 Trochanteric bursitis, right hip: Secondary | ICD-10-CM

## 2023-09-15 DIAGNOSIS — R42 Dizziness and giddiness: Secondary | ICD-10-CM

## 2023-09-15 DIAGNOSIS — G629 Polyneuropathy, unspecified: Secondary | ICD-10-CM | POA: Diagnosis not present

## 2023-09-15 DIAGNOSIS — M19042 Primary osteoarthritis, left hand: Secondary | ICD-10-CM | POA: Diagnosis not present

## 2023-09-15 DIAGNOSIS — M0579 Rheumatoid arthritis with rheumatoid factor of multiple sites without organ or systems involvement: Secondary | ICD-10-CM | POA: Diagnosis not present

## 2023-09-15 DIAGNOSIS — Z8639 Personal history of other endocrine, nutritional and metabolic disease: Secondary | ICD-10-CM

## 2023-09-15 DIAGNOSIS — Z8679 Personal history of other diseases of the circulatory system: Secondary | ICD-10-CM

## 2023-09-15 DIAGNOSIS — Z8709 Personal history of other diseases of the respiratory system: Secondary | ICD-10-CM

## 2023-09-15 DIAGNOSIS — J383 Other diseases of vocal cords: Secondary | ICD-10-CM

## 2023-09-15 DIAGNOSIS — Z9181 History of falling: Secondary | ICD-10-CM

## 2023-09-15 DIAGNOSIS — Z79899 Other long term (current) drug therapy: Secondary | ICD-10-CM

## 2023-09-15 DIAGNOSIS — E559 Vitamin D deficiency, unspecified: Secondary | ICD-10-CM | POA: Diagnosis not present

## 2023-09-15 DIAGNOSIS — M8589 Other specified disorders of bone density and structure, multiple sites: Secondary | ICD-10-CM

## 2023-09-15 NOTE — Patient Instructions (Signed)
Standing Labs We placed an order today for your standing lab work.   Please have your standing labs drawn in June and every 3 months  Please have your labs drawn 2 weeks prior to your appointment so that the provider can discuss your lab results at your appointment, if possible.  Please note that you may see your imaging and lab results in MyChart before we have reviewed them. We will contact you once all results are reviewed. Please allow our office up to 72 hours to thoroughly review all of the results before contacting the office for clarification of your results.  WALK-IN LAB HOURS  Monday through Thursday from 8:00 am -12:30 pm and 1:00 pm-5:00 pm and Friday from 8:00 am-12:00 pm.  Patients with office visits requiring labs will be seen before walk-in labs.  You may encounter longer than normal wait times. Please allow additional time. Wait times may be shorter on  Monday and Thursday afternoons.  We do not book appointments for walk-in labs. We appreciate your patience and understanding with our staff.   Labs are drawn by [facility]. Please bring your co-pay at the time of your lab draw.  You may receive a bill from [facility] for your lab work.  Please note if you are on Hydroxychloroquine and and an order has been placed for a Hydroxychloroquine level,  you will need to have it drawn 4 hours or more after your last dose.  If you wish to have your labs drawn at another location, please call the office 24 hours in advance so we can fax the orders.  The office is located at [address], [address], [place], [place] [address]   If you have any questions regarding directions or hours of operation,  please call [phone].   As a reminder, please drink plenty of water prior to coming for your lab work. Thanks!  Vaccines You are taking a medication(s) that can suppress your immune system.  The following immunizations are recommended: Flu annually Covid-19  Td/Tdap (tetanus, diphtheria,  pertussis) every 10 years Pneumonia (Prevnar 15 then Pneumovax 23 at least 1 year apart.  Alternatively, can take Prevnar 20 without needing additional dose) Shingrix: 2 doses from 4 weeks to 6 months apart  Please check with your PCP to make sure you are up to date.   If you have signs or symptoms of an infection or start antibiotics: First, call your PCP for workup of your infection. Hold your medication through the infection, until you complete your antibiotics, and until symptoms resolve if you take the following: Injectable medication (Actemra, Benlysta, Cimzia, Cosentyx, Enbrel, Humira, Kevzara, Orencia, Remicade, Simponi, Stelara, Taltz, Tremfya) Methotrexate Leflunomide (Arava) Mycophenolate (Cellcept) Xeljanz, Olumiant, or Rinvoq  

## 2023-09-20 ENCOUNTER — Ambulatory Visit: Payer: 59 | Admitting: Pulmonary Disease

## 2023-09-23 ENCOUNTER — Encounter: Payer: Self-pay | Admitting: Pulmonary Disease

## 2023-09-23 ENCOUNTER — Ambulatory Visit: Admitting: Pulmonary Disease

## 2023-09-23 DIAGNOSIS — J449 Chronic obstructive pulmonary disease, unspecified: Secondary | ICD-10-CM | POA: Diagnosis not present

## 2023-09-23 DIAGNOSIS — R42 Dizziness and giddiness: Secondary | ICD-10-CM | POA: Diagnosis not present

## 2023-09-23 DIAGNOSIS — Z87891 Personal history of nicotine dependence: Secondary | ICD-10-CM | POA: Diagnosis not present

## 2023-09-23 DIAGNOSIS — M0600 Rheumatoid arthritis without rheumatoid factor, unspecified site: Secondary | ICD-10-CM

## 2023-09-23 MED ORDER — TRELEGY ELLIPTA 100-62.5-25 MCG/ACT IN AEPB: INHALATION_SPRAY | RESPIRATORY_TRACT | 3 refills | Status: AC

## 2023-09-23 MED ORDER — ALBUTEROL SULFATE HFA 108 (90 BASE) MCG/ACT IN AERS: 2.0000 | INHALATION_SPRAY | RESPIRATORY_TRACT | 3 refills | Status: AC | PRN

## 2023-09-23 NOTE — Progress Notes (Signed)
 Tina Hansen    [id number]    09/06/1940  Primary Care Physician:Olson, Mabeline Caras, MD  Referring Physician: Sandre Kitty, MD [address] [place],  [place] [address]  Chief complaint: Follow-up for COPD  HPI: 83 y.o.  with vocal cord dysfunction, obesity, COPD, rheumatoid arthritis Previously followed with Dr. Kriste Basque.  Maintained on Symbicort for several years with stable symptoms Has chronic dyspnea on exertion.  Denies any cough, sputum production  Follows with Dr. Corliss Skains for seronegative rheumatoid arthritis Methotrexate stopped and she was changed to [facility] by rheumatology in August 2021.  She is tolerating it well so far Symbicort changed to Trelegy in 2022 with improvement in symptoms  She had a ED visit in January 2024 for uncontrolled hypertension, hyperglycemia, balance issues.  She had CT and MRI of the brain.  Chest x-ray at that time did not show any acute lung abnormality.  Pets: No pets Occupation: Retired Agricultural engineer Exposures: No known exposures, no mold, hot tub, Jacuzzi Smoking history: 30-pack-year smoker.  Quit in 1987 Travel history: No significant travel history Relevant family history: No significant family history of lung disease  Interval history:  Discussed the use of AI scribe software for clinical note transcription with the patient, who gave verbal consent to proceed.  History of Present Illness Tina Hansen is a 83 year old female with severe COPD who presents for routine follow-up.  She has severe chronic obstructive pulmonary disease (COPD) and is currently stable with no issues since her last visit. She uses Trelegy, which she describes as working 'like magic.' She requests a refill for her albuterol inhaler, as it has expired, although she does not need it often but finds it necessary when outside more frequently. No recent respiratory infections, such as colds or flu.  She experiences vertigo, which has been a  persistent issue since a severe episode two years ago. It affects her balance, causing her to 'walk like a duck.' A comprehensive workup, including CT and MRI, was done in January 2025 due to an episode of dizziness, hypertension, and hyperglycemia. She was admitted to the emergency room, but no definitive cause was identified, and she was discharged with elevated blood pressure and glucose levels.  She has a history of seronegative rheumatoid arthritis, for which she is under the care of Dr. Corliss Skains. She is currently taking leflunomide and reports that her arthritis is under good control. She has been on leflunomide for two to three years.  She remains functional, able to walk, drive, and take care of herself.   Outpatient Encounter Medications as of 09/23/2023  Medication Sig   albuterol (PROVENTIL) (2.5 MG/3ML) 0.083% nebulizer solution USE ONE VIAL IN NEBULIZER EVERY 6 HOURS AS NEEDED   albuterol (VENTOLIN HFA) 108 (90 Base) MCG/ACT inhaler INHALE 1 TO 2 PUFFS BY MOUTH EVERY 6 HOURS AS NEEDED FOR WHEEZING AND FOR SHORTNESS OF BREATH   Ascorbic Acid (VITAMIN C PO) Take by mouth daily.   Biotin 5000 MCG CAPS Take 10,000 mcg by mouth daily.   calcium gluconate 500 MG tablet Take 500 mg by mouth daily.   Cholecalciferol (VITAMIN D3) 1000 units CAPS Take 1 capsule by mouth daily.    Chromium 1 MG CAPS Take 1 mg by mouth daily. Increase to 2 caps if needed   Coenzyme Q10 (COQ-10) 100 MG CAPS Take 1 capsule by mouth daily.   ECHINACEA PO Take by mouth daily.   empagliflozin (JARDIANCE) 25 MG  TABS tablet Take 1 tablet (25 mg total) by mouth daily before breakfast.   fish oil-omega-3 fatty acids 1000 MG capsule Take 2 capsules by mouth daily.   Fluticasone-Umeclidin-Vilant (TRELEGY ELLIPTA) 100-62.5-25 MCG/ACT AEPB INHALE 1 PUFF INTO LUNGS ONCE DAILY   hydrALAZINE (APRESOLINE) 25 MG tablet TAKE 1 TABLET BY MOUTH THREE TIMES DAILY   leflunomide (ARAVA) 20 MG tablet TAKE 1/2 (ONE-HALF) TABLET BY MOUTH  EVERY OTHER DAY ALTERNATING WITH 1 TABLET EVERY OTHER DAY   LUTEIN PO Take by mouth daily.   Methylsulfonylmethane (MSM PO) Take by mouth daily.   Milk Thistle 200 MG CAPS Take 400 mg by mouth daily.   Multiple Vitamin (MULTIVITAMIN) capsule Take 1 capsule by mouth daily.   NON FORMULARY Take 1 capsule by mouth daily. Collagen capsule   Semaglutide, 1 MG/DOSE, (OZEMPIC, 1 MG/DOSE,) 4 MG/3ML SOPN INJECT 1 MG INTO THE SKIN ONCE A WEEK   valsartan (DIOVAN) 160 MG tablet Take 2 tablets by mouth once daily   Vitamin D-Vitamin K (VITAMIN K2-VITAMIN D3 PO) Take by mouth.   vitamin E 180 MG (400 UNITS) capsule Take 400 Units by mouth daily.   No facility-administered encounter medications on file as of 09/23/2023.    Physical Exam: Blood pressure 123/75, pulse 94, height 5\' 6"  (1.676 m), weight 218 lb 3.2 oz (99 kg), SpO2 92%. Gen:      No acute distress HEENT:  EOMI, sclera anicteric Neck:     No masses; no thyromegaly Lungs:    Clear to auscultation bilaterally; normal respiratory effort CV:         Regular rate and rhythm; no murmurs Abd:      + bowel sounds; soft, non-tender; no palpable masses, no distension Ext:    No edema; adequate peripheral perfusion Skin:      Warm and dry; no rash Neuro: alert and oriented x 3 Psych: normal mood and affect   Data Reviewed: Imaging: Chest x-ray 06/28/2017- hyperinflated lungs.  No acute cardiopulmonary abnormality. Chest x-ray 12/29/2018-hyperinflated lungs.  No acute abnormality Chest x-ray 03/15/2020-emphysema.  No acute cardiopulmonary abnormality. Chest x-ray 07/17/2022-emphysema, no acute cardiopulmonary abnormality. I have reviewed the images personally.  PFTs: 05/09/2015 FVC 2 [65%), FEV1 1 [43%], F/F 49 Severe obstruction  06/07/2020 FVC 2.33 [1%], FEV1 1.20 [55%], F/F 52, TLC 5.68 [101%], DLCO 13.58 [65%] Severe obstruction, moderate diffusion defect  Assessment & Plan Chronic Obstructive Pulmonary Disease (COPD) Severe COPD,  well-managed with Trelegy. No recent exacerbations or respiratory infections. Trelegy effectively manages symptoms. Requires albuterol for occasional use, especially outdoors. Prefers to avoid unnecessary imaging to reduce exposure. - Refill albuterol and Trelegy prescriptions - Avoid unnecessary imaging per patient preference  Seronegative Rheumatoid Arthritis Seronegative rheumatoid arthritis under good control with leflunomide. No evidence of interstitial lung disease associated with rheumatoid arthritis. Regular follow-up with rheumatologist is maintained.  Vertigo Severe vertigo two years ago with persistent balance issues. No recent severe episodes, but ongoing balance difficulties. Previous extensive imaging in January was unremarkable for underlying causes.  Follow-up She does not require routine follow-up unless new issues arise. Open to video visits if needed, with family assistance for technology. Coordination with primary care for Trelegy renewal is an option if needed. - Return as needed - Coordinate with primary care for Trelegy renewal if needed   Plan/Recommendations: - Continue trelegy  Follow-up as needed  Chilton Greathouse MD [facility] 09/23/2023, 2:21 PM  CC: Sandre Kitty, MD

## 2023-09-23 NOTE — Patient Instructions (Signed)
 VISIT SUMMARY:  You had a follow-up appointment today to review your chronic obstructive pulmonary disease (COPD), vertigo, and seronegative rheumatoid arthritis. Your COPD is stable with no recent issues, and your current medication, Trelegy, is working well. You requested a refill for your albuterol inhaler. Your vertigo continues to affect your balance, but there have been no recent severe episodes. Your rheumatoid arthritis is under good control with leflunomide.  YOUR PLAN:  -CHRONIC OBSTRUCTIVE PULMONARY DISEASE (COPD): COPD is a chronic lung condition that makes it hard to breathe. Your COPD is well-managed with Trelegy, and you have not had any recent flare-ups or respiratory infections. We will refill your prescriptions for Trelegy and albuterol. You should avoid unnecessary imaging tests to reduce exposure.  -SERONEGATIVE RHEUMATOID ARTHRITIS: Seronegative rheumatoid arthritis is a type of arthritis that causes joint inflammation and pain but does not show certain antibodies in the blood. Your condition is well-controlled with leflunomide, and you should continue your regular follow-ups with your rheumatologist.  -VERTIGO: Vertigo is a condition that causes dizziness and affects your balance. You have had persistent balance issues since a severe episode two years ago, but there have been no recent severe episodes. Extensive imaging in January did not reveal any underlying causes.  INSTRUCTIONS:  You do not need routine follow-up unless new issues arise. You can return as needed and are open to video visits if necessary, with family assistance for technology. Coordinate with your primary care provider for Trelegy renewal if needed.

## 2023-11-23 ENCOUNTER — Encounter: Payer: Self-pay | Admitting: Family Medicine

## 2023-11-23 ENCOUNTER — Ambulatory Visit (INDEPENDENT_AMBULATORY_CARE_PROVIDER_SITE_OTHER): Admitting: Family Medicine

## 2023-11-23 VITALS — BP 136/84 | HR 111 | Ht 66.0 in | Wt 203.8 lb

## 2023-11-23 DIAGNOSIS — E1165 Type 2 diabetes mellitus with hyperglycemia: Secondary | ICD-10-CM | POA: Diagnosis not present

## 2023-11-23 DIAGNOSIS — Z23 Encounter for immunization: Secondary | ICD-10-CM | POA: Diagnosis not present

## 2023-11-23 DIAGNOSIS — I152 Hypertension secondary to endocrine disorders: Secondary | ICD-10-CM | POA: Diagnosis not present

## 2023-11-23 DIAGNOSIS — R42 Dizziness and giddiness: Secondary | ICD-10-CM

## 2023-11-23 DIAGNOSIS — Z7985 Long-term (current) use of injectable non-insulin antidiabetic drugs: Secondary | ICD-10-CM | POA: Diagnosis not present

## 2023-11-23 DIAGNOSIS — M7021 Olecranon bursitis, right elbow: Secondary | ICD-10-CM | POA: Diagnosis not present

## 2023-11-23 DIAGNOSIS — E1159 Type 2 diabetes mellitus with other circulatory complications: Secondary | ICD-10-CM | POA: Diagnosis not present

## 2023-11-23 MED ORDER — OZEMPIC (2 MG/DOSE) 8 MG/3ML ~~LOC~~ SOPN: 2.0000 mg | PEN_INJECTOR | SUBCUTANEOUS | 2 refills | Status: DC

## 2023-11-23 NOTE — Assessment & Plan Note (Signed)
 currently controlled on Valsartan  and Hydralazine  - Continue current medications

## 2023-11-23 NOTE — Assessment & Plan Note (Signed)
 Pt continues to complain of vertigo, although this time describes it more as she is moving, rather than feeling like her surroundings are moving.  Again declines vestibular PT.  Will need to recheck tsh, get b12, b1 labs.  Cmp and cbc have been normal.  Possibly d/t diabetic neuropathy - Continue using cane for ambulation safety

## 2023-11-23 NOTE — Progress Notes (Unsigned)
   Established Patient Office Visit  Subjective   Patient ID: Tina Hansen, female    DOB: 26-Nov-1940  Age: 83 y.o. MRN: [id number]  No chief complaint on file.   HPI  Subjective - Dizziness acting up again - Legs getting "wonky" - reports legs not sturdy, feeling like knees want to bend wrong way, comes and goes in both legs - No falls since November when fell at thrift store without apparent cause - No back pain - No headaches in 10 years - Reports elevated fasting blood sugar 220 today - Reports eating rice recently - Reports episode in January with elevated blood sugar and blood pressure requiring emergency room visit - Reports heat pump issues causing financial stress - Reports significant weight loss  Medications: Jardiance  25mg  daily (diabetes), Ozempic  1mg  weekly (diabetes), Valsartan  two tablets daily (blood pressure), Hydralazine  three times daily (blood pressure), Albuterol  (pulmonary), Trilogy (pulmonary).  PMH: Diabetes, hypertension, vertigo, possible small stroke (MRI January 2024 showed small spot in internal capsule).  PSH: None mentioned.  FH: None mentioned.  Social Hx: Reports financial stress due to high utility bills. Reports using stevia as sweetener. Reports occasional ice cream consumption.  ROS: Positive for dizziness, balance issues, leg weakness. Negative for falls, back pain, headaches.   The ASCVD Risk score (Arnett DK, et al., 2019) failed to calculate for the following reasons:   The 2019 ASCVD risk score is only valid for ages 69 to 11  Health Maintenance Due  Topic Date Due   COVID-19 Vaccine (1) Never done   OPHTHALMOLOGY EXAM  Never done   DTaP/Tdap/Td (1 - Tdap) Never done      Objective:     There were no vitals taken for this visit. {Vitals History (Optional):23777}  Physical Exam General: Alert, oriented Pulmonary: No respiratory distress Remedies: No pedal edema, normal DP pulses bilaterally, sensation  intact.   No results found for any visits on 11/23/23.      Assessment & Plan:   Uncontrolled type 2 diabetes mellitus with hyperglycemia (HCC) Assessment & Plan: Diabetes mellitus with elevated A1C 9.8 (previously 8.1, with best control at 5.7 in May last year). Reports elevated fasting blood sugar 220 today. Currently on Jardiance  25mg  daily and Ozempic  1mg  weekly. - Increase Ozempic  to 2mg  weekly - Discussed dietary modifications including limiting carbohydrate intake - Discussed sugar-free alternatives for desserts   Hypertension associated with type 2 diabetes mellitus (HCC) Assessment & Plan:  currently controlled on Valsartan  and Hydralazine  - Continue current medications   Olecranon bursitis of right elbow Assessment & Plan: - Discussed compression sleeve as conservative management - Drainage by sports medicine specialist an option if becomes problematic   Vertigo Assessment & Plan:  - reports dizziness and unsteady gait - Discussed vestibular rehabilitation therapy, patient considering options - Continue using cane for ambulation safety   Other orders -     Ozempic  (2 MG/DOSE); Inject 2 mg into the skin once a week.  Dispense: 3 mL; Refill: 2     Return in about 3 months (around 02/23/2024) for DM.    Laneta Pintos, MD

## 2023-11-23 NOTE — Assessment & Plan Note (Signed)
-   Discussed compression sleeve as conservative management - Drainage by sports medicine specialist an option if becomes problematic

## 2023-11-23 NOTE — Assessment & Plan Note (Signed)
 Diabetes mellitus with elevated A1C 9.8 (previously 8.1, with best control at 5.7 in May last year). Reports elevated fasting blood sugar 220 today. Currently on Jardiance  25mg  daily and Ozempic  1mg  weekly. - Increase Ozempic  to 2mg  weekly - Discussed dietary modifications including limiting carbohydrate intake - Discussed sugar-free alternatives for desserts

## 2023-11-23 NOTE — Patient Instructions (Signed)
 It was nice to see you today,  We addressed the following topics today: -I am increasing your Ozempic  dose to 2 mg.  When you go to pick up your Ozempic  in the next few days should be the 2 mg dose. - We will follow-up in 3 months. - Try to limit your sugar intake at home.  Have a great day,  Etha Henle, MD

## 2023-11-25 LAB — POCT GLYCOSYLATED HEMOGLOBIN (HGB A1C): Hemoglobin A1C: 9.8 % — AB (ref 4.0–5.6)

## 2023-11-25 NOTE — Addendum Note (Signed)
 Addended by: Tommie Frame on: 11/25/2023 08:22 AM   Modules accepted: Orders

## 2023-11-29 ENCOUNTER — Telehealth: Payer: Self-pay

## 2023-11-29 NOTE — Telephone Encounter (Signed)
 Copied from CRM [id number]. Topic: Referral - Request for Referral >> Nov 29, 2023  4:11 PM Donald Frost wrote: Did the patient discuss referral with their provider in the last year? Yes (If No - schedule appointment) (If Yes - send message)  Appointment offered? No  Type of order/referral and detailed reason for visit: Referral to urologist because she is constantly using the bathroom and it is hard to control  Preference of office, provider, location: Whatever Urologist her provider recommends that accepts her insurance  If referral order, have you been seen by this specialty before? No (If Yes, this issue or another issue? When? Where?  Can we respond through MyChart? No she would prefer a call or text  Please assist patient further

## 2023-11-29 NOTE — Telephone Encounter (Signed)
 Copied from CRM [id number]. Topic: Clinical - Medication Question >> Nov 29, 2023  4:07 PM Donald Frost wrote: Reason for CRM: The patient called in stating her vertigo is bothering her enough that she is wondering if medication intervention would help her. She states she has talked with friends who have been on a combination of Zyrtec and Singulair and even though she doesn't have allergies she wonders if this may help. She has also heard Meclizine  may help with vertigo. Please assist patient if you think any of these would help as she is not interested in any PT or physical appt other than medication at this time. If the provider does agree on a medication to be called into her pharmacy please use  [id number] - [place] ([address]), [place] - [id number]  Phone: [phone] Fax: [phone]  Please assist patient further

## 2023-12-01 ENCOUNTER — Other Ambulatory Visit: Payer: Self-pay | Admitting: Rheumatology

## 2023-12-02 NOTE — Telephone Encounter (Signed)
 Last Fill: 09/13/2023  Labs: 09/13/2023 Glucose is elevated.  Creatinine is mildly elevated.  CBC is normal.   Next Visit: 02/14/2024  Last Visit: 09/15/2023  DX: Seropositive rheumatoid arthritis of multiple sites   Current Dose per office note 09/15/2023: Arava  10 mg alternating with 20 mg every other day.   Okay to refill Arava  ?

## 2023-12-07 NOTE — Telephone Encounter (Signed)
 Called pt she stated that she does not want an MRI she said she's seen ing a chiropractor and it seems to be helping,also she would like to try that medication that was mention in her last visit

## 2023-12-07 NOTE — Telephone Encounter (Signed)
 Please call the patient and let her know I would like to repeat the MRI of her brain since her symptoms seem to be getting worse.    Also ask if she would like to try any of the medications I mentioned in previous visits regarding her urinary issues before I send in a referral to the urologist.    I don't think her symptoms will improve with allergy  medication if she has no allergy  symptoms but flonase nasal spray and zyrtec are over the counter and can be used together to treat seasonal allergies.

## 2023-12-10 ENCOUNTER — Other Ambulatory Visit: Payer: Self-pay | Admitting: Family Medicine

## 2023-12-10 MED ORDER — SOLIFENACIN SUCCINATE 5 MG PO TABS: 5.0000 mg | ORAL_TABLET | Freq: Every day | ORAL | 1 refills | Status: DC

## 2023-12-10 NOTE — Telephone Encounter (Signed)
 Please let the patient know I sent in vesicare, since it looks like this is the one covered by her insurance.  She will take it once a day.  side effects include dry mouth, constipation, blurred vision, or urinary retention.

## 2023-12-13 NOTE — Telephone Encounter (Signed)
 Called pt she is agreeable to this Rx

## 2023-12-21 ENCOUNTER — Other Ambulatory Visit: Payer: Self-pay | Admitting: *Deleted

## 2023-12-21 DIAGNOSIS — Z79899 Other long term (current) drug therapy: Secondary | ICD-10-CM

## 2023-12-22 ENCOUNTER — Ambulatory Visit: Payer: Self-pay | Admitting: Rheumatology

## 2023-12-22 LAB — CBC WITH DIFFERENTIAL/PLATELET
Absolute Lymphocytes: 1533 {cells}/uL (ref 850–3900)
Absolute Monocytes: 735 {cells}/uL (ref 200–950)
Basophils Absolute: 70 {cells}/uL (ref 0–200)
Basophils Relative: 1 %
Eosinophils Absolute: 189 {cells}/uL (ref 15–500)
Eosinophils Relative: 2.7 %
HCT: 45.6 % — ABNORMAL HIGH (ref 35.0–45.0)
Hemoglobin: 14.5 g/dL (ref 11.7–15.5)
MCH: 29.2 pg (ref 27.0–33.0)
MCHC: 31.8 g/dL — ABNORMAL LOW (ref 32.0–36.0)
MCV: 91.9 fL (ref 80.0–100.0)
MPV: 10 fL (ref 7.5–12.5)
Monocytes Relative: 10.5 %
Neutro Abs: 4473 {cells}/uL (ref 1500–7800)
Neutrophils Relative %: 63.9 %
Platelets: 278 Thousand/uL (ref 140–400)
RBC: 4.96 10*6/uL (ref 3.80–5.10)
RDW: 13.1 % (ref 11.0–15.0)
Total Lymphocyte: 21.9 %
WBC: 7 Thousand/uL (ref 3.8–10.8)

## 2023-12-22 LAB — COMPREHENSIVE METABOLIC PANEL WITH GFR
AG Ratio: 1.7 (calc) (ref 1.0–2.5)
ALT: 24 U/L (ref 6–29)
AST: 22 U/L (ref 10–35)
Albumin: 4.3 g/dL (ref 3.6–5.1)
Alkaline phosphatase (APISO): 59 U/L (ref 37–153)
BUN: 10 mg/dL (ref 7–25)
CO2: 25 mmol/L (ref 20–32)
Calcium: 10.3 mg/dL (ref 8.6–10.4)
Chloride: 104 mmol/L (ref 98–110)
Creat: 0.93 mg/dL (ref 0.60–0.95)
Globulin: 2.5 g/dL (ref 1.9–3.7)
Glucose, Bld: 228 mg/dL — ABNORMAL HIGH (ref 65–99)
Potassium: 4.9 mmol/L (ref 3.5–5.3)
Sodium: 139 mmol/L (ref 135–146)
Total Bilirubin: 0.6 mg/dL (ref 0.2–1.2)
Total Protein: 6.8 g/dL (ref 6.1–8.1)
eGFR: 61 mL/min/{1.73_m2} (ref >=60)

## 2023-12-22 NOTE — Progress Notes (Signed)
 CBC and CMP are normal except glucose is elevated at 228.

## 2024-01-24 ENCOUNTER — Other Ambulatory Visit: Payer: Self-pay

## 2024-01-24 MED ORDER — OZEMPIC (2 MG/DOSE) 8 MG/3ML ~~LOC~~ SOPN: 2.0000 mg | PEN_INJECTOR | SUBCUTANEOUS | 2 refills | Status: DC

## 2024-01-31 NOTE — Progress Notes (Signed)
 Office Visit Note  Patient: Tina Hansen             Date of Birth: 07/18/1940           MRN: [id number]             PCP: Chandra Toribio POUR, MD Referring: Chandra Toribio POUR, MD Visit Date: 02/14/2024 Occupation: @GUAROCC @  Subjective:  Medication monitoring  History of Present Illness: Tina Hansen is a 83 y.o. female with history of seropositive rheumatoid arthritis and osteoarthritis.  Patient remains on Arava  10 mg alternating with 20 mg every other day.  She continues to tolerate Arava  without any side effects and has not had any gaps in therapy.  She denies any signs or symptoms of a rheumatoid arthritis flare.  She denies any joint swelling at this time. Patient states that she has established care with a chiropractor which has helped with her alignment and mobility.  She denies any recent falls.  Her knee joint pain has improved since seeing a chiropractor.  She denies any recent or recurrent infections.     Activities of Daily Living:  Patient reports morning stiffness for 0 minute.   Patient Denies nocturnal pain.  Difficulty dressing/grooming: Denies Difficulty climbing stairs: Reports Difficulty getting out of chair: Reports Difficulty using hands for taps, buttons, cutlery, and/or writing: Denies  Review of Systems  Constitutional:  Negative for fatigue.  HENT:  Positive for mouth dryness. Negative for mouth sores.   Eyes:  Negative for dryness.  Respiratory:  Negative for shortness of breath.   Cardiovascular:  Negative for chest pain and palpitations.  Gastrointestinal:  Negative for blood in stool, constipation and diarrhea.  Endocrine: Positive for increased urination.  Genitourinary:  Positive for involuntary urination.  Musculoskeletal:  Negative for joint pain, gait problem, joint pain, joint swelling, myalgias, muscle weakness, morning stiffness, muscle tenderness and myalgias.  Skin:  Positive for sensitivity to sunlight. Negative for color change,  rash and hair loss.  Allergic/Immunologic: Negative for susceptible to infections.  Neurological:  Negative for dizziness and headaches.  Hematological:  Negative for swollen glands.  Psychiatric/Behavioral:  Positive for sleep disturbance. Negative for depressed mood. The patient is not nervous/anxious.     PMFS History:  Patient Active Problem List   Diagnosis Date Noted   Olecranon bursitis of right elbow 08/23/2023   Vertigo 02/16/2023   Urinary frequency 02/16/2023   Vitamin D  deficiency 09/10/2022   Pain 09/25/2019   Fatigue 09/25/2019   Statin declined 08/21/2019   Non compliance with medical treatment 08/21/2019   Uncontrolled type 2 diabetes mellitus with hyperglycemia (HCC) 11/07/2018   BMI 40.0-44.9, adult (HCC) 08/08/2018   Onychomycosis 04/06/2018   Decreased GFR 01/05/2018   Lipoma of left upper extremity 08/09/2017   COPD mixed type (HCC) 06/28/2017   Osteopenia of multiple sites 06/26/2016   Class 3 obesity with alveolar hypoventilation, serious comorbidity, and body mass index (BMI) of 45.0 to 49.9 in adult (HCC) 05/04/2016   High risk medication use 04/25/2016   Seropositive rheumatoid arthritis of multiple sites (HCC) 04/29/2015   Hyperlipidemia 10/06/2007   Dyspnea on exertion 10/06/2007   CERVICAL CANCER, HX OF 10/06/2007   Vocal cord dysfunction 10/06/2007   Hypertension associated with type 2 diabetes mellitus (HCC) 10/06/2007    Past Medical History:  Diagnosis Date   Asthma    Cervical cancer (HCC)    Diabetes (HCC)    Emphysema lung (HCC)    Hyperlipidemia    Hypertension  Rheumatoid arthritis(714.0)    [name]   Vertigo     Family History  Problem Relation Age of Onset   Asthma Mother    Breast cancer Mother    Cancer Mother    Alcohol abuse Father    Diabetes Son    Cancer Maternal Uncle    Heart attack Maternal Uncle    Past Surgical History:  Procedure Laterality Date   ABDOMINAL HYSTERECTOMY     BREAST SURGERY     Breat  implants     LIPOMA EXCISION Left    shoulder    TUBAL LIGATION     TUMOR REMOVAL Right    FOREARM   Social History   Social History Narrative   Not on file   Immunization History  Administered Date(s) Administered   PNEUMOCOCCAL CONJUGATE-20 11/23/2023   Pneumococcal Conjugate-13 04/06/2018   Pneumococcal Polysaccharide-23 06/27/2007, 03/22/2009   Zoster Recombinant(Shingrix ) 05/11/2019, 07/14/2019   Zoster, Live 05/11/2019     Objective: Vital Signs: BP 132/86 (BP Location: Left Arm, Patient Position: Sitting, Cuff Size: Normal)   Pulse 89   Resp 14   Ht 5' 6.5 (1.689 m)   Wt 206 lb (93.4 kg)   BMI 32.75 kg/m    Physical Exam Vitals and nursing note reviewed.  Constitutional:      Appearance: She is well-developed.  HENT:     Head: Normocephalic and atraumatic.  Eyes:     Conjunctiva/sclera: Conjunctivae normal.  Cardiovascular:     Rate and Rhythm: Normal rate and regular rhythm.     Heart sounds: Normal heart sounds.  Pulmonary:     Effort: Pulmonary effort is normal.     Breath sounds: Normal breath sounds.  Abdominal:     General: Bowel sounds are normal.     Palpations: Abdomen is soft.  Musculoskeletal:     Cervical back: Normal range of motion.  Lymphadenopathy:     Cervical: No cervical adenopathy.  Skin:    General: Skin is warm and dry.     Capillary Refill: Capillary refill takes less than 2 seconds.  Neurological:     Mental Status: She is alert and oriented to person, place, and time.  Psychiatric:        Behavior: Behavior normal.      Musculoskeletal Exam: C-spine has good range of motion.  Thoracic kyphosis noted.  Shoulder joints, elbow joints, wrist joints, MCPs, PIPs, DIPs have good range of motion with no synovitis.  CMC joint prominence bilaterally.  DIP and DIP thickening consistent with osteoarthritis of both hands.  Hip joints have good range of motion with no groin pain.  Knee joints have good range of motion no warmth or  effusion.  Ankle joints have good range of motion with no tenderness or joint swelling.  CDAI Exam: CDAI Score: -- Patient Global: --; Provider Global: -- Swollen: --; Tender: -- Joint Exam 02/14/2024   No joint exam has been documented for this visit   There is currently no information documented on the homunculus. Go to the Rheumatology activity and complete the homunculus joint exam.  Investigation: No additional findings.  Imaging: No results found.  Recent Labs: Lab Results  Component Value Date   WBC 7.0 12/21/2023   HGB 14.5 12/21/2023   PLT 278 12/21/2023   NA 139 12/21/2023   K 4.9 12/21/2023   CL 104 12/21/2023   CO2 25 12/21/2023   GLUCOSE 228 (H) 12/21/2023   BUN 10 12/21/2023   CREATININE 0.93 12/21/2023  BILITOT 0.6 12/21/2023   ALKPHOS 63 07/31/2022   AST 22 12/21/2023   ALT 24 12/21/2023   PROT 6.8 12/21/2023   ALBUMIN 4.4 07/31/2022   CALCIUM 10.3 12/21/2023   GFRAA 76 10/22/2020    Speciality Comments: Methotrexate  discontinued due to elevated creatinine.  Arava  start date February 06, 2020  Procedures:  No procedures performed Allergies: Other and Theophylline    Assessment / Plan:     Visit Diagnoses: Seropositive rheumatoid arthritis of multiple sites (HCC) - +RF, +CCP, h/o elevated sed rate: She has no synovitis on examination today.  She has not had any signs or symptoms of a rheumatoid arthritis flare.  She has clinically been doing well taking Arava  20 mg alternating with 10 mg every other day.  She is tolerating Arava  without any side effects and has not had any gaps in therapy.  Patient has had an improvement in her symptoms since seeing a chiropractor.  Her mobility has improved. No medication changes were made at this time.  She was advised to notify us  if she develops any signs or symptoms of a flare.  She will follow-up in the office in 5 months or sooner if needed.  High risk medication use - Arava  10 mg alternating with 20 mg every  other day. CBC and CMP updated on 12/21/23.  Her next lab work is due in October and every 3 months.  Standing orders for CBC and CMP remain in place. No recent or recurrent infections.  Discussed the importance of holding arava  if she develops signs or symptoms of an infection and to resume once the infection has completely cleared.   Primary osteoarthritis of both hands: She has CMC, PIP, DIP thickening consistent with osteoarthritis of both hands.  No synovitis noted on examination today.  She was able to make a complete fist bilaterally.  Osteopenia of multiple sites - DEXA 05/06/2021: The BMD measured at Forearm Radius 33% is 0.739 g/cm2 with a T-score of -1.7.  Use of calcium rich diet and vitamin D  supplementation discussed.  Overdue to update bone density.  Vitamin D  deficiency: She is taking a daily vitamin D  supplement.  Other medical conditions are listed as follows:  History of diabetes mellitus  History of COPD  History of hypertension: Blood pressure was 132/86 today in the office.  History of hyperlipidemia: Lipid panel obtained on 07/31/2022.  Future due for lipid panel placed today to be drawn with her next lab work.  Vertigo  Vocal cord dysfunction  Neuropathy  Orders: Orders Placed This Encounter  Procedures   Lipid panel   Meds ordered this encounter  Medications   leflunomide  (ARAVA ) 20 MG tablet    Sig: TAKE 1/2 (ONE-HALF) TABLET BY MOUTH EVERY OTHER DAY ALTERNATING WITH 1 TABLET EVERY OTHER DAY    Dispense:  68 tablet    Refill:  0     Follow-Up Instructions: Return in about 5 months (around 07/16/2024) for Rheumatoid arthritis.   Waddell CHRISTELLA Craze, PA-C  Note - This record has been created using Dragon software.  Chart creation errors have been sought, but may not always  have been located. Such creation errors do not reflect on  the standard of medical care.

## 2024-02-06 ENCOUNTER — Other Ambulatory Visit: Payer: Self-pay | Admitting: Family Medicine

## 2024-02-14 ENCOUNTER — Encounter: Payer: Self-pay | Admitting: Physician Assistant

## 2024-02-14 ENCOUNTER — Ambulatory Visit: Attending: Physician Assistant | Admitting: Physician Assistant

## 2024-02-14 VITALS — BP 132/86 | HR 89 | Resp 14 | Ht 66.5 in | Wt 206.0 lb

## 2024-02-14 DIAGNOSIS — M8589 Other specified disorders of bone density and structure, multiple sites: Secondary | ICD-10-CM | POA: Diagnosis not present

## 2024-02-14 DIAGNOSIS — R42 Dizziness and giddiness: Secondary | ICD-10-CM | POA: Diagnosis not present

## 2024-02-14 DIAGNOSIS — Z8679 Personal history of other diseases of the circulatory system: Secondary | ICD-10-CM | POA: Diagnosis not present

## 2024-02-14 DIAGNOSIS — G629 Polyneuropathy, unspecified: Secondary | ICD-10-CM

## 2024-02-14 DIAGNOSIS — Z8639 Personal history of other endocrine, nutritional and metabolic disease: Secondary | ICD-10-CM

## 2024-02-14 DIAGNOSIS — Z8709 Personal history of other diseases of the respiratory system: Secondary | ICD-10-CM

## 2024-02-14 DIAGNOSIS — M19041 Primary osteoarthritis, right hand: Secondary | ICD-10-CM | POA: Diagnosis not present

## 2024-02-14 DIAGNOSIS — M0579 Rheumatoid arthritis with rheumatoid factor of multiple sites without organ or systems involvement: Secondary | ICD-10-CM | POA: Diagnosis not present

## 2024-02-14 DIAGNOSIS — E559 Vitamin D deficiency, unspecified: Secondary | ICD-10-CM | POA: Diagnosis not present

## 2024-02-14 DIAGNOSIS — Z79899 Other long term (current) drug therapy: Secondary | ICD-10-CM | POA: Diagnosis not present

## 2024-02-14 DIAGNOSIS — M19042 Primary osteoarthritis, left hand: Secondary | ICD-10-CM | POA: Diagnosis not present

## 2024-02-14 DIAGNOSIS — J383 Other diseases of vocal cords: Secondary | ICD-10-CM | POA: Diagnosis not present

## 2024-02-14 MED ORDER — LEFLUNOMIDE 20 MG PO TABS: ORAL_TABLET | ORAL | 0 refills | Status: AC

## 2024-02-14 NOTE — Patient Instructions (Signed)
 Standing Labs We placed an order today for your standing lab work.   Please have your standing labs drawn in October and every 3 months  Please have your labs drawn 2 weeks prior to your appointment so that the provider can discuss your lab results at your appointment, if possible.  Please note that you Paulino see your imaging and lab results in MyChart before we have reviewed them. We will contact you once all results are reviewed. Please allow our office up to 72 hours to thoroughly review all of the results before contacting the office for clarification of your results.  WALK-IN LAB HOURS  Monday through Thursday from 8:00 am -12:30 pm and 1:00 pm-4:30 pm and Friday from 8:00 am-12:00 pm.  Patients with office visits requiring labs will be seen before walk-in labs.  You Sando encounter longer than normal wait times. Please allow additional time. Wait times Hillery be shorter on  Monday and Thursday afternoons.  We do not book appointments for walk-in labs. We appreciate your patience and understanding with our staff.   Labs are drawn by [facility]. Please bring your co-pay at the time of your lab draw.  You [place] receive a bill from [facility] for your lab work.  Please note if you are on Hydroxychloroquine and and an order has been placed for a Hydroxychloroquine level,  you will need to have it drawn 4 hours or more after your last dose.  If you wish to have your labs drawn at another location, please call the office 24 hours in advance so we can fax the orders.  The office is located at [address], [address], [place], [place] [address]   If you have any questions regarding directions or hours of operation,  please call [phone].   As a reminder, please drink plenty of water prior to coming for your lab work. Thanks!

## 2024-02-16 ENCOUNTER — Other Ambulatory Visit: Payer: Self-pay

## 2024-02-16 DIAGNOSIS — I10 Essential (primary) hypertension: Secondary | ICD-10-CM

## 2024-02-16 MED ORDER — HYDRALAZINE HCL 25 MG PO TABS: 25.0000 mg | ORAL_TABLET | Freq: Three times a day (TID) | ORAL | 5 refills | Status: AC

## 2024-02-28 ENCOUNTER — Encounter: Payer: Self-pay | Admitting: Family Medicine

## 2024-02-28 ENCOUNTER — Ambulatory Visit (INDEPENDENT_AMBULATORY_CARE_PROVIDER_SITE_OTHER): Admitting: Family Medicine

## 2024-02-28 VITALS — BP 109/73 | HR 86 | Ht 66.5 in | Wt 207.0 lb

## 2024-02-28 DIAGNOSIS — Z7985 Long-term (current) use of injectable non-insulin antidiabetic drugs: Secondary | ICD-10-CM | POA: Diagnosis not present

## 2024-02-28 DIAGNOSIS — E1165 Type 2 diabetes mellitus with hyperglycemia: Secondary | ICD-10-CM | POA: Diagnosis not present

## 2024-02-28 DIAGNOSIS — E1159 Type 2 diabetes mellitus with other circulatory complications: Secondary | ICD-10-CM | POA: Diagnosis not present

## 2024-02-28 DIAGNOSIS — I152 Hypertension secondary to endocrine disorders: Secondary | ICD-10-CM

## 2024-02-28 DIAGNOSIS — R350 Frequency of micturition: Secondary | ICD-10-CM | POA: Diagnosis not present

## 2024-02-28 DIAGNOSIS — Z0000 Encounter for general adult medical examination without abnormal findings: Secondary | ICD-10-CM

## 2024-02-28 LAB — POCT GLYCOSYLATED HEMOGLOBIN (HGB A1C): HbA1c POC (<> result, manual entry): 7 % (ref 4.0–5.6)

## 2024-02-28 NOTE — Patient Instructions (Signed)
 It was nice to see you today,  We addressed the following topics today: -Your A1c was 7.0.  This is much better.  No changes to your medications today. - Your Vesicare  has a higher dose.  They can go from 5 mg to 10 mg.  You can try taking 2 of your 5 mg tablets a day over the next week and if this makes your symptoms better, when I refill it we can refill it at 10 mg.  Otherwise we will just refill it at 5 mg.   Have a great day,  Rolan Slain, MD

## 2024-02-28 NOTE — Progress Notes (Unsigned)
   Established Patient Office Visit  Subjective   Patient ID: Tina Hansen, female    DOB: 1940-11-24  Age: 83 y.o. MRN: [id number]  Chief Complaint  Patient presents with   Medical Management of Chronic Issues    HPI  Subjective - Vertigo: Resolved. Reports this was related to a neck issue that was treated by a chiropractor. No recent episodes. - Diabetes Mellitus: Follow-up. Reports A1c has improved. - Urinary Frequency: Reports improvement on Vesicare  5mg . Frequency has decreased from every 1-1.5 hours to every 2-3 hours. Still has some urgency with positional changes. Discussed trial of increasing dose. - Health Maintenance: Due for tetanus vaccine. Declines flu shot.  Medications Reports taking Ozempic  2 mg without side effects, Jardiance , and Vesicare  5 mg for urinary frequency.  PMH, PSH, FH, Social Hx PMHx: Type 2 Diabetes Mellitus with retinopathy, vertigo (resolved), hip issue (resolved with chiropractic care). PSH: Not discussed. FH: Not discussed. Social Hx: Not discussed.  ROS GU: Reports urinary frequency and urgency, improved with medication. Denies dysuria. MSK: Denies hip pain. Neuro: Denies vertigo or dizziness. No falls since last visit.  Objective A1c: 7.0 (was 9.8 in June).  Assessment and Plan Diabetes Mellitus Follow-up for diabetes. A1c has improved from 9.8 to 7.0 since increasing Ozempic  to 2 mg. Tolerating highest dose of Ozempic  and Jardiance  well. - Continue current medications: Ozempic  2 mg weekly, Jardiance . - Counseled on continued diet modification to further lower A1c. - Deferred further lab work for dizziness as symptoms have resolved. - Recommended annual diabetic retinopathy screening with an eye doctor. - Plan to check urine and perform a foot exam at the next visit.  Urinary Frequency Symptoms have improved on Vesicare  5 mg, but are not fully resolved. - Instructed to trial Vesicare  10 mg by taking two 5 mg tablets daily for  one week to assess for improved efficacy. - Will prescribe 10 mg dose if the trial is successful and well-tolerated.  Health Maintenance Patient is due for tetanus immunization. - Tdap vaccine administered in office today. - Declines influenza vaccine. - Follow-up in 4 months (January 2026) for yearly labs and exams.   The ASCVD Risk score (Arnett DK, et al., 2019) failed to calculate for the following reasons:   The 2019 ASCVD risk score is only valid for ages 30 to 4  Health Maintenance Due  Topic Date Due   COVID-19 Vaccine (1) Never done   OPHTHALMOLOGY EXAM  Never done   DTaP/Tdap/Td (1 - Tdap) Never done   Influenza Vaccine  Never done   Medicare Annual Wellness (AWV)  03/08/2024      Objective:     There were no vitals taken for this visit. {Vitals History (Optional):23777}  Physical Exam   No results found for any visits on 02/28/24.      Assessment & Plan:   There are no diagnoses linked to this encounter.   No follow-ups on file.    Toribio MARLA Slain, MD

## 2024-03-03 NOTE — Assessment & Plan Note (Signed)
 Symptoms have improved on Vesicare  5 mg, but are not fully resolved. - Instructed to trial Vesicare  10 mg by taking two 5 mg tablets daily for one week to assess for improved efficacy. - Will prescribe 10 mg dose if the trial is successful and well-tolerated.

## 2024-03-03 NOTE — Assessment & Plan Note (Signed)
 A1c has improved from 9.8 to 7.0 since increasing Ozempic  to 2 mg. Tolerating highest dose of Ozempic  and Jardiance  well. - Continue current medications: Ozempic  2 mg weekly, Jardiance . - Counseled on continued diet modification to further lower A1c. - Deferred further lab work for dizziness as symptoms have resolved. - Recommended annual diabetic retinopathy screening with an eye doctor. - Plan to check urine and perform a foot exam at the next visit.

## 2024-03-03 NOTE — Assessment & Plan Note (Signed)
 Patient is due for tetanus immunization. - Tdap vaccine administered in office today. - Declines influenza vaccine. - Follow-up in 4 months (January 2026) for yearly labs and exams.

## 2024-03-09 ENCOUNTER — Ambulatory Visit: Payer: 59

## 2024-03-09 DIAGNOSIS — Z0000 Encounter for general adult medical examination without abnormal findings: Secondary | ICD-10-CM | POA: Diagnosis not present

## 2024-03-09 NOTE — Patient Instructions (Signed)
 Tina Hansen,  Thank you for taking the time for your Medicare Wellness Visit. I appreciate your continued commitment to your health goals. Please review the care plan we discussed, and feel free to reach out if I can assist you further.  Medicare recommends these wellness visits once per year to help you and your care team stay ahead of potential health issues. These visits are designed to focus on prevention, allowing your provider to concentrate on managing your acute and chronic conditions during your regular appointments.  Please note that Annual Wellness Visits do not include a physical exam. Some assessments may be limited, especially if the visit was conducted virtually. If needed, we may recommend a separate in-person follow-up with your provider.  Ongoing Care Seeing your primary care provider every 3 to 6 months helps us  monitor your health and provide consistent, personalized care.   Referrals If a referral was made during today's visit and you haven't received any updates within two weeks, please contact the referred provider directly to check on the status.  Recommended Screenings:  Health Maintenance  Topic Date Due   COVID-19 Vaccine (1) Never done   Eye exam for diabetics  Never done   DTaP/Tdap/Td vaccine (1 - Tdap) Never done   Flu Shot  Never done   Yearly kidney health urinalysis for diabetes  08/22/2024   Hemoglobin A1C  08/27/2024   Complete foot exam   11/22/2024   Yearly kidney function blood test for diabetes  12/20/2024   Medicare Annual Wellness Visit  03/09/2025   Pneumococcal Vaccine for age over 101  Completed   DEXA scan (bone density measurement)  Completed   Zoster (Shingles) Vaccine  Completed   HPV Vaccine  Aged Out   Meningitis B Vaccine  Aged Out       03/09/2024    3:07 PM  Advanced Directives  Does Patient Have a Medical Advance Directive? No  Would patient like information on creating a medical advance directive? No - Patient declined    Advance Care Planning is important because it: Ensures you receive medical care that aligns with your values, goals, and preferences. Provides guidance to your family and loved ones, reducing the emotional burden of decision-making during critical moments.  Vision: Annual vision screenings are recommended for early detection of glaucoma, cataracts, and diabetic retinopathy. These exams can also reveal signs of chronic conditions such as diabetes and high blood pressure.  Dental: Annual dental screenings help detect early signs of oral cancer, gum disease, and other conditions linked to overall health, including heart disease and diabetes.  Please see the attached documents for additional preventive care recommendations.

## 2024-03-09 NOTE — Progress Notes (Signed)
 Subjective:   Tina Hansen is a 83 y.o. who presents for a Medicare Wellness preventive visit.  As a reminder, Annual Wellness Visits don't include a physical exam, and some assessments may be limited, especially if this visit is performed virtually. We may recommend an in-person follow-up visit with your provider if needed.  Visit Complete: Virtual I connected with  Tina Hansen on 03/09/24 by a audio enabled telemedicine application and verified that I am speaking with the correct person using two identifiers.  Patient Location: Home  Provider Location: Home Office  I discussed the limitations of evaluation and management by telemedicine. The patient expressed understanding and agreed to proceed.  Vital Signs: Because this visit was a virtual/telehealth visit, some criteria may be missing or patient reported. Any vitals not documented were not able to be obtained and vitals that have been documented are patient reported.  VideoError- Librarian, academic were attempted between this provider and patient, however failed, due to patient having technical difficulties OR patient did not have access to video capability.  We continued and completed visit with audio only.   Persons Participating in Visit: Patient.  AWV Questionnaire: No: Patient Medicare AWV questionnaire was not completed prior to this visit.  Cardiac Risk Factors include: advanced age (>22men, >25 women);diabetes mellitus;dyslipidemia;hypertension     Objective:    Today's Vitals   There is no height or weight on file to calculate BMI.     03/09/2024    3:07 PM 03/09/2023    1:14 PM 07/17/2022    3:14 AM 09/08/2017   11:39 AM  Advanced Directives  Does Patient Have a Medical Advance Directive? No No No No   Would patient like information on creating a medical advance directive? No - Patient declined No - Patient declined  No - Patient declined      Data saved with a previous  flowsheet row definition    Current Medications (verified) Outpatient Encounter Medications as of 03/09/2024  Medication Sig   albuterol  (VENTOLIN  HFA) 108 (90 Base) MCG/ACT inhaler Inhale 2 puffs into the lungs every 4 (four) hours as needed for wheezing or shortness of breath.   Ascorbic Acid (VITAMIN C PO) Take by mouth daily.   Biotin 5000 MCG CAPS Take 10,000 mcg by mouth daily.   calcium gluconate 500 MG tablet Take 500 mg by mouth daily.   Cholecalciferol (VITAMIN D3) 1000 units CAPS Take 1 capsule by mouth daily.    Chromium 1 MG CAPS Take 1 mg by mouth daily. Increase to 2 caps if needed   Coenzyme Q10 (COQ-10) 100 MG CAPS Take 1 capsule by mouth daily.   empagliflozin  (JARDIANCE ) 25 MG TABS tablet Take 1 tablet (25 mg total) by mouth daily before breakfast.   fish oil-omega-3 fatty acids 1000 MG capsule Take 2 capsules by mouth daily.   Fluticasone-Umeclidin-Vilant (TRELEGY ELLIPTA ) 100-62.5-25 MCG/ACT AEPB INHALE 1 PUFF INTO LUNGS ONCE DAILY   hydrALAZINE  (APRESOLINE ) 25 MG tablet Take 1 tablet (25 mg total) by mouth 3 (three) times daily.   leflunomide  (ARAVA ) 20 MG tablet TAKE 1/2 (ONE-HALF) TABLET BY MOUTH EVERY OTHER DAY ALTERNATING WITH 1 TABLET EVERY OTHER DAY   LUTEIN PO Take by mouth daily.   Multiple Vitamin (MULTIVITAMIN) capsule Take 1 capsule by mouth daily.   NON FORMULARY Take 1 capsule by mouth daily. Collagen capsule   Semaglutide , 2 MG/DOSE, (OZEMPIC , 2 MG/DOSE,) 8 MG/3ML SOPN Inject 2 mg into the skin once a week.  solifenacin  (VESICARE ) 5 MG tablet Take 1 tablet by mouth once daily   valsartan  (DIOVAN ) 160 MG tablet Take 2 tablets by mouth once daily   Vitamin D -Vitamin K (VITAMIN K2-VITAMIN D3 PO) Take by mouth.   vitamin E 180 MG (400 UNITS) capsule Take 400 Units by mouth daily.   ECHINACEA PO Take by mouth daily. (Patient not taking: Reported on 03/09/2024)   Methylsulfonylmethane (MSM PO) Take by mouth daily. (Patient not taking: Reported on 03/09/2024)    Milk Thistle 200 MG CAPS Take 400 mg by mouth daily. (Patient not taking: Reported on 03/09/2024)   No facility-administered encounter medications on file as of 03/09/2024.    Allergies (verified) Other and Theophylline   History: Past Medical History:  Diagnosis Date   Asthma    Cervical cancer (HCC)    Diabetes (HCC)    Emphysema lung (HCC)    Hyperlipidemia    Hypertension    Rheumatoid arthritis(714.0)    [name]   Vertigo    Past Surgical History:  Procedure Laterality Date   ABDOMINAL HYSTERECTOMY     BREAST SURGERY     Breat implants     LIPOMA EXCISION Left    shoulder    TUBAL LIGATION     TUMOR REMOVAL Right    FOREARM   Family History  Problem Relation Age of Onset   Asthma Mother    Breast cancer Mother    Cancer Mother    Alcohol abuse Father    Diabetes Son    Cancer Maternal Uncle    Heart attack Maternal Uncle    Social History   Socioeconomic History   Marital status: Divorced    Spouse name: Not on file   Number of children: 1   Years of education: Not on file   Highest education level: Not on file  Occupational History   Occupation: retired  Tobacco Use   Smoking status: Former    Current packs/day: 0.00    Average packs/day: 1 pack/day for 30.0 years (30.0 ttl pk-yrs)    Types: Cigarettes    Start date: 06/23/1955    Quit date: 06/22/1985    Years since quitting: 38.7    Passive exposure: Never   Smokeless tobacco: Never  Vaping Use   Vaping status: Never Used  Substance and Sexual Activity   Alcohol use: Yes    Comment: OCCASSIONAL GLASS OF WINE 1-2 MONTHLY   Drug use: No   Sexual activity: Never  Other Topics Concern   Not on file  Social History Narrative   Not on file   Social Drivers of Health   Financial Resource Strain: Low Risk  (03/09/2024)   Overall Financial Resource Strain (CARDIA)    Difficulty of Paying Living Expenses: Not hard at all  Food Insecurity: No Food Insecurity (03/09/2024)   Hunger Vital Sign     Worried About Running Out of Food in the Last Year: Never true    Ran Out of Food in the Last Year: Never true  Transportation Needs: No Transportation Needs (03/09/2024)   PRAPARE - Administrator, Civil Service (Medical): No    Lack of Transportation (Non-Medical): No  Physical Activity: Inactive (03/09/2024)   Exercise Vital Sign    Days of Exercise per Week: 0 days    Minutes of Exercise per Session: 0 min  Stress: No Stress Concern Present (03/09/2024)   [employer] of Occupational Health - Occupational Stress Questionnaire    Feeling of Stress: Not  at all  Social Connections: Socially Isolated (03/09/2024)   Social Connection and Isolation Panel    Frequency of Communication with Friends and Family: More than three times a week    Frequency of Social Gatherings with Friends and Family: More than three times a week    Attends Religious Services: Never    Database administrator or Organizations: No    Attends Engineer, structural: Never    Marital Status: Divorced    Tobacco Counseling Counseling given: Not Answered    Clinical Intake:  Pre-visit preparation completed: Yes  Pain : No/denies pain     Nutritional Risks: None Diabetes: Yes CBG done?: No Did pt. bring in CBG monitor from home?: No  Lab Results  Component Value Date   HGBA1C 7.0 02/28/2024   HGBA1C 9.8 (A) 11/25/2023   HGBA1C 8.1 08/23/2023     How often do you need to have someone help you when you read instructions, pamphlets, or other written materials from your doctor or pharmacy?: 1 - Never  Interpreter Needed?: No  Information entered by :: NAllen LPN   Activities of Daily Living     03/09/2024    2:59 PM  In your present state of health, do you have any difficulty performing the following activities:  Hearing? 0  Vision? 0  Difficulty concentrating or making decisions? 0  Walking or climbing stairs? 1  Comment emphysema and asthma  Dressing or bathing? 0   Doing errands, shopping? 0  Preparing Food and eating ? N  Using the Toilet? N  In the past six months, have you accidently leaked urine? Y  Comment weak bladder  Do you have problems with loss of bowel control? N  Managing your Medications? N  Managing your Finances? N  Housekeeping or managing your Housekeeping? N    Patient Care Team: Chandra Toribio POUR, MD as PCP - General (Family Medicine) Christi Glendia HERO, MD as Consulting Physician (Pulmonary Disease) Dolphus Reiter, MD as Consulting Physician (Rheumatology) Mannam, Praveen, MD as Consulting Physician (Pulmonary Disease) Abonza, [name], PA-C (Inactive) as Physician Assistant (Physician Assistant)  I have updated your Care Teams any recent Medical Services you may have received from other providers in the past year.     Assessment:   This is a routine wellness examination for [place].  Hearing/Vision screen Hearing Screening - Comments:: Denies hearing issues Vision Screening - Comments:: No Regular eye exams, [facility]   Goals Addressed             This Visit's Progress    Patient Stated       03/09/2024, maintain       Depression Screen     03/09/2024    3:08 PM 02/28/2024    3:00 PM 11/23/2023    3:28 PM 08/23/2023    2:31 PM 05/25/2023    2:12 PM 03/09/2023    1:09 PM 02/16/2023    2:23 PM  PHQ 2/9 Scores  PHQ - 2 Score 0 0 1 0 0 0 0  PHQ- 9 Score 4 0 5 4 4  0 0    Fall Risk     03/09/2024    3:07 PM 11/23/2023    3:27 PM 09/23/2023    2:16 PM 03/09/2023    1:12 PM 05/04/2022    3:24 PM  Fall Risk   Falls in the past year? 0 1 1 1 1   Number falls in past yr: 0 0 0 0 0  Injury  with Fall? 0 0 1 0 0  Risk for fall due to : Medication side effect Other (Comment) Impaired balance/gait No Fall Risks   Follow up Falls evaluation completed;Falls prevention discussed Falls evaluation completed  Falls prevention discussed     MEDICARE RISK AT HOME:  Medicare Risk at Home Any stairs in or around the home?:  No If so, are there any without handrails?: No Home free of loose throw rugs in walkways, pet beds, electrical cords, etc?: Yes Adequate lighting in your home to reduce risk of falls?: Yes Life alert?: No Use of a cane, walker or w/c?: No Grab bars in the bathroom?: No Shower chair or bench in shower?: No Elevated toilet seat or a handicapped toilet?: Yes  TIMED UP AND GO:  Was the test performed?  No  Cognitive Function: 6CIT completed        03/09/2024    3:10 PM 03/09/2023    1:14 PM 10/29/2020    2:59 PM  6CIT Screen  What Year? 0 points 0 points 0 points  What month? 0 points 0 points 0 points  What time? 3 points 0 points 0 points  Count back from 20 0 points 0 points 0 points  Months in reverse 0 points 0 points 0 points  Repeat phrase 0 points 0 points 0 points  Total Score 3 points 0 points 0 points    Immunizations Immunization History  Administered Date(s) Administered   PNEUMOCOCCAL CONJUGATE-20 11/23/2023   Pneumococcal Conjugate-13 04/06/2018   Pneumococcal Polysaccharide-23 06/27/2007, 03/22/2009   Zoster Recombinant(Shingrix ) 05/11/2019, 07/14/2019   Zoster, Live 05/11/2019    Screening Tests Health Maintenance  Topic Date Due   COVID-19 Vaccine (1) Never done   OPHTHALMOLOGY EXAM  Never done   DTaP/Tdap/Td (1 - Tdap) Never done   Influenza Vaccine  Never done   Diabetic kidney evaluation - Urine ACR  08/22/2024   HEMOGLOBIN A1C  08/27/2024   FOOT EXAM  11/22/2024   Diabetic kidney evaluation - eGFR measurement  12/20/2024   Medicare Annual Wellness (AWV)  03/09/2025   Pneumococcal Vaccine: 50+ Years  Completed   DEXA SCAN  Completed   Zoster Vaccines- Shingrix   Completed   HPV VACCINES  Aged Out   Meningococcal B Vaccine  Aged Out    Health Maintenance Items Addressed: Declines vaccines. Due for eye exam.  Additional Screening:  Vision Screening: Recommended annual ophthalmology exams for early detection of glaucoma and other disorders  of the eye. Is the patient up to date with their annual eye exam?  No  Who is the provider or what is the name of the office in which the patient attends annual eye exams? [facility]  Dental Screening: Recommended annual dental exams for proper oral hygiene  Community Resource Referral / Chronic Care Management: CRR required this visit?  No   CCM required this visit?  No   Plan:    I have personally reviewed and noted the following in the patient's chart:   Medical and social history Use of alcohol, tobacco or illicit drugs  Current medications and supplements including opioid prescriptions. Patient is not currently taking opioid prescriptions. Functional ability and status Nutritional status Physical activity Advanced directives List of other physicians Hospitalizations, surgeries, and ER visits in previous 12 months Vitals Screenings to include cognitive, depression, and falls Referrals and appointments  In addition, I have reviewed and discussed with patient certain preventive protocols, quality metrics, and best practice recommendations. A written personalized care plan  for preventive services as well as general preventive health recommendations were provided to patient.   Ardella FORBES Dawn, LPN   [id number]   After Visit Summary: (Pick Up) Due to this being a telephonic visit, with patients personalized plan was offered to patient and patient has requested to Pick up at office.  Notes: Nothing significant to report at this time.

## 2024-03-19 ENCOUNTER — Other Ambulatory Visit: Payer: Self-pay | Admitting: Family Medicine

## 2024-04-14 ENCOUNTER — Other Ambulatory Visit: Payer: Self-pay | Admitting: Family Medicine

## 2024-04-14 MED ORDER — OZEMPIC (2 MG/DOSE) 8 MG/3ML ~~LOC~~ SOPN: 2.0000 mg | PEN_INJECTOR | SUBCUTANEOUS | 5 refills | Status: AC

## 2024-05-16 ENCOUNTER — Telehealth: Payer: Self-pay

## 2024-05-16 ENCOUNTER — Other Ambulatory Visit: Payer: Self-pay | Admitting: Family Medicine

## 2024-05-16 DIAGNOSIS — E1165 Type 2 diabetes mellitus with hyperglycemia: Secondary | ICD-10-CM

## 2024-05-16 NOTE — Telephone Encounter (Signed)
 It wont be 3 months from her last a1c until 12/8.  We can get the A1c after that date but she has an appointment January 12th and she could just get it done while she's here for her appointment.  I'll put a order in in case she wants it sooner but I'd prefer to do it at her appointment.

## 2024-05-16 NOTE — Telephone Encounter (Signed)
 Called patient unable to LVM if pt calls please advised Per Dr. Chandra It wont be 3 months from her last a1c until 12/8.  We can get the A1c after that date but she has an appointment January 12th and she could just get it done while she's here for her appointment.  I'll put a order in in case she wants it sooner but I'd prefer to do it at her appointment.

## 2024-05-16 NOTE — Addendum Note (Signed)
 Addended by: CHANDRA TORIBIO POUR on: 05/16/2024 11:46 AM   Modules accepted: Orders

## 2024-05-16 NOTE — Telephone Encounter (Signed)
 Copied from CRM [id number]. Topic: Clinical - Request for Lab/Test Order >> May 16, 2024  9:35 AM Rea ORN wrote: Reason for CRM: pt would like her A1C checked. There is no order currently in chart.   Please call back once lab ordered is placed, [phone]

## 2024-05-17 NOTE — Telephone Encounter (Signed)
 2 attempt call pt unable to LVM if pt calls please advised will send a mychart message

## 2024-07-03 ENCOUNTER — Encounter: Payer: Self-pay | Admitting: Family Medicine

## 2024-07-03 ENCOUNTER — Ambulatory Visit: Admitting: Family Medicine

## 2024-07-03 VITALS — BP 119/75 | HR 91 | Ht 66.5 in | Wt 211.4 lb

## 2024-07-03 DIAGNOSIS — J449 Chronic obstructive pulmonary disease, unspecified: Secondary | ICD-10-CM

## 2024-07-03 DIAGNOSIS — R350 Frequency of micturition: Secondary | ICD-10-CM | POA: Diagnosis not present

## 2024-07-03 DIAGNOSIS — Z7985 Long-term (current) use of injectable non-insulin antidiabetic drugs: Secondary | ICD-10-CM | POA: Diagnosis not present

## 2024-07-03 DIAGNOSIS — M0579 Rheumatoid arthritis with rheumatoid factor of multiple sites without organ or systems involvement: Secondary | ICD-10-CM | POA: Diagnosis not present

## 2024-07-03 DIAGNOSIS — E1159 Type 2 diabetes mellitus with other circulatory complications: Secondary | ICD-10-CM

## 2024-07-03 DIAGNOSIS — I152 Hypertension secondary to endocrine disorders: Secondary | ICD-10-CM | POA: Diagnosis not present

## 2024-07-03 DIAGNOSIS — E1165 Type 2 diabetes mellitus with hyperglycemia: Secondary | ICD-10-CM

## 2024-07-03 LAB — POCT GLYCOSYLATED HEMOGLOBIN (HGB A1C): HbA1c POC (<> result, manual entry): 7.5 %

## 2024-07-03 NOTE — Assessment & Plan Note (Addendum)
"   Continue trelegy         "

## 2024-07-03 NOTE — Assessment & Plan Note (Signed)
 Currently on VESIcare  at the lowest dose. Discussed increasing dose to improve symptoms. - Increase VESIcare  to two tablets daily. - Monitor for symptom improvement or worsening.

## 2024-07-03 NOTE — Assessment & Plan Note (Signed)
 A1c increased to 7.5, likely due to dietary indiscretions. Blood pressure managed with hydralazine  and valsartan . - Continue Ozempic  and Jardiance . - Recheck A1c in four months. - Continue hydralazine  three times daily and valsartan  once daily.

## 2024-07-03 NOTE — Assessment & Plan Note (Signed)
 Continue leflunomide  as managed by rheumatologist.

## 2024-07-03 NOTE — Patient Instructions (Signed)
" °  VISIT SUMMARY: Today you had a routine follow-up visit to manage your diabetes, hypertension, and other health concerns. We discussed your recent A1c increase, your current medications, and adjustments to your treatment plan.  YOUR PLAN: TYPE 2 DIABETES MELLITUS WITH HYPERGLYCEMIA AND HYPERTENSION: Your A1c has increased to 7.5, likely due to recent dietary changes. Your blood pressure is managed with hydralazine  and valsartan . -Continue taking Ozempic  and Jardiance  as prescribed. -We will recheck your A1c in four months. -Continue taking hydralazine  three times daily and valsartan  once daily.  OVERACTIVE BLADDER: You have been experiencing larger voids and occasional urgency with position changes. -Increase VESIcare  to two tablets daily. -Monitor for any improvement or worsening of your symptoms.    "

## 2024-07-03 NOTE — Progress Notes (Signed)
" ° °  Established Patient Office Visit  Subjective   Patient ID: Tina Hansen, female    DOB: Dec 06, 1940  Age: 84 y.o. MRN: [id number]  Chief Complaint  Patient presents with   Medical Management of Chronic Issues       History of Present Illness   Tina Hansen is an 84 year old female with diabetes and hypertension who presents for routine follow-up.  Her A1c has increased from 7.0 to 7.5, which she relates to holiday dietary changes. She manages diabetes with Ozempic  and Jardiance .  She takes hydralazine  three times daily and valsartan  once daily for hypertension. She skipped hydralazine  this morning but plans to take it later.  She uses Trelegy inhaler once daily for respiratory symptoms and has not needed her rescue inhaler recently.  She takes VESIcare  for urinary symptoms with some improvement, but she notes larger voids and occasional urgency with position changes.  She is on leflunomide  for rheumatoid arthritis, alternating one pill one day with half a pill the next.  She reports financial constraints that affect her ability to purchase some medications and supplements.          The ASCVD Risk score (Arnett DK, et al., 2019) failed to calculate for the following reasons:   The 2019 ASCVD risk score is only valid for ages 33 to 82   * - Cholesterol units were assumed  Health Maintenance Due  Topic Date Due   COVID-19 Vaccine (1) Never done   DTaP/Tdap/Td (1 - Tdap) Never done   OPHTHALMOLOGY EXAM  08/05/2022      Objective:     BP 119/75   Pulse 91   Ht 5' 6.5 (1.689 m)   Wt 211 lb 6.4 oz (95.9 kg)   SpO2 93%   BMI 33.61 kg/m    Physical Exam     Gen: alert, oriented Pulm: no respiratory distress Psych: pleasant affect       Results for orders placed or performed in visit on 07/03/24  POCT glycosylated hemoglobin (Hb A1C)  Result Value Ref Range   Hemoglobin A1C     HbA1c POC (<> result, manual entry) 7.5 4.0 - 5.6 %   HbA1c, POC  (prediabetic range)     HbA1c, POC (controlled diabetic range)          Assessment & Plan:   Uncontrolled type 2 diabetes mellitus with hyperglycemia (HCC) Assessment & Plan: A1c increased to 7.5, likely due to dietary indiscretions. Blood pressure managed with hydralazine  and valsartan . - Continue Ozempic  and Jardiance . - Recheck A1c in four months. - Continue hydralazine  three times daily and valsartan  once daily.  Orders: -     POCT glycosylated hemoglobin (Hb A1C)  Hypertension associated with type 2 diabetes mellitus (HCC) Assessment & Plan: Continue valsartan  and hydralazine   Orders: -     POCT glycosylated hemoglobin (Hb A1C)  Urinary frequency Assessment & Plan: Currently on VESIcare  at the lowest dose. Discussed increasing dose to improve symptoms. - Increase VESIcare  to two tablets daily. - Monitor for symptom improvement or worsening.   Seropositive rheumatoid arthritis of multiple sites [facility]) Assessment & Plan: Continue leflunomide  as managed by rheumatologist.    COPD mixed type [facility]) Assessment & Plan: Continue trelegy          Return in about 4 months (around 10/31/2024) for DM.    Tina MARLA Slain, MD  "

## 2024-07-05 NOTE — Progress Notes (Unsigned)
 "  Office Visit Note  Patient: Tina Hansen             Date of Birth: 06/17/1941           MRN: [id number]             PCP: Chandra Toribio POUR, MD Referring: Chandra Toribio POUR, MD Visit Date: 07/19/2024 Occupation: Data Unavailable  Subjective:  No chief complaint on file.   History of Present Illness: Tina Hansen is a 84 y.o. female ***     Activities of Daily Living:  Patient reports morning stiffness for *** {minute/hour:19697}.   Patient {ACTIONS;DENIES/REPORTS:21021675::Denies} nocturnal pain.  Difficulty dressing/grooming: {ACTIONS;DENIES/REPORTS:21021675::Denies} Difficulty climbing stairs: {ACTIONS;DENIES/REPORTS:21021675::Denies} Difficulty getting out of chair: {ACTIONS;DENIES/REPORTS:21021675::Denies} Difficulty using hands for taps, buttons, cutlery, and/or writing: {ACTIONS;DENIES/REPORTS:21021675::Denies}  No Rheumatology ROS completed.   PMFS History:  Patient Active Problem List   Diagnosis Date Noted   Olecranon bursitis of right elbow 08/23/2023   Vertigo 02/16/2023   Urinary frequency 02/16/2023   Vitamin D  deficiency 09/10/2022   Pain 09/25/2019   Fatigue 09/25/2019   Statin declined 08/21/2019   Non compliance with medical treatment 08/21/2019   Uncontrolled type 2 diabetes mellitus with hyperglycemia (HCC) 11/07/2018   Onychomycosis 04/06/2018   Decreased GFR 01/05/2018   Healthcare maintenance 09/08/2017   Lipoma of left upper extremity 08/09/2017   COPD mixed type (HCC) 06/28/2017   Osteopenia of multiple sites 06/26/2016   High risk medication use 04/25/2016   Seropositive rheumatoid arthritis of multiple sites (HCC) 04/29/2015   Hyperlipidemia 10/06/2007   Dyspnea on exertion 10/06/2007   CERVICAL CANCER, HX OF 10/06/2007   Vocal cord dysfunction 10/06/2007   Hypertension associated with type 2 diabetes mellitus (HCC) 10/06/2007    Past Medical History:  Diagnosis Date   Asthma    Cervical cancer (HCC)    Diabetes  (HCC)    Emphysema lung (HCC)    Hyperlipidemia    Hypertension    Rheumatoid arthritis(714.0)    [name]   Vertigo     Family History  Problem Relation Age of Onset   Asthma Mother    Breast cancer Mother    Cancer Mother    Alcohol abuse Father    Diabetes Son    Cancer Maternal Uncle    Heart attack Maternal Uncle    Past Surgical History:  Procedure Laterality Date   ABDOMINAL HYSTERECTOMY     BREAST SURGERY     Breat implants     LIPOMA EXCISION Left    shoulder    TUBAL LIGATION     TUMOR REMOVAL Right    FOREARM   Social History[1] Social History   Social History Narrative   Not on file     Immunization History  Administered Date(s) Administered   PNEUMOCOCCAL CONJUGATE-20 11/23/2023   Pneumococcal Conjugate-13 04/06/2018   Pneumococcal Polysaccharide-23 06/27/2007, 03/22/2009   Zoster Recombinant(Shingrix ) 05/11/2019, 07/14/2019   Zoster, Live 05/11/2019     Objective: Vital Signs: There were no vitals taken for this visit.   Physical Exam   Musculoskeletal Exam: ***  CDAI Exam: CDAI Score: -- Patient Global: --; Provider Global: -- Swollen: --; Tender: -- Joint Exam 07/19/2024   No joint exam has been documented for this visit   There is currently no information documented on the homunculus. Go to the Rheumatology activity and complete the homunculus joint exam.  Investigation: No additional findings.  Imaging: No results found.  Recent Labs: Lab Results  Component Value Date  WBC 7.0 12/21/2023   HGB 14.5 12/21/2023   PLT 278 12/21/2023   NA 139 12/21/2023   K 4.9 12/21/2023   CL 104 12/21/2023   CO2 25 12/21/2023   GLUCOSE 228 (H) 12/21/2023   BUN 10 12/21/2023   CREATININE 0.93 12/21/2023   BILITOT 0.6 12/21/2023   ALKPHOS 63 07/31/2022   AST 22 12/21/2023   ALT 24 12/21/2023   PROT 6.8 12/21/2023   ALBUMIN 4.4 07/31/2022   CALCIUM 10.3 12/21/2023   GFRAA 76 10/22/2020    Speciality Comments: Methotrexate   discontinued due to elevated creatinine.  Arava  start date February 06, 2020  Procedures:  No procedures performed Allergies: Other and Theophylline   Assessment / Plan:     Visit Diagnoses: No diagnosis found.  Orders: No orders of the defined types were placed in this encounter.  No orders of the defined types were placed in this encounter.   Face-to-face time spent with patient was *** minutes. Greater than 50% of time was spent in counseling and coordination of care.  Follow-Up Instructions: No follow-ups on file.   Daved JAYSON Gavel, CMA  Note - This record has been created using Animal nutritionist.  Chart creation errors have been sought, but may not always  have been located. Such creation errors do not reflect on  the standard of medical care.    [1]  Social History Tobacco Use   Smoking status: Former    Current packs/day: 0.00    Average packs/day: 1 pack/day for 30.0 years (30.0 ttl pk-yrs)    Types: Cigarettes    Start date: 06/23/1955    Quit date: 06/22/1985    Years since quitting: 39.0    Passive exposure: Never   Smokeless tobacco: Never  Vaping Use   Vaping status: Never Used  Substance Use Topics   Alcohol use: Yes    Comment: OCCASSIONAL GLASS OF WINE 1-2 MONTHLY   Drug use: No   "

## 2024-07-08 NOTE — Assessment & Plan Note (Signed)
 Continue valsartan and hydralazine

## 2024-07-19 ENCOUNTER — Ambulatory Visit: Admitting: Rheumatology

## 2024-07-19 DIAGNOSIS — R42 Dizziness and giddiness: Secondary | ICD-10-CM

## 2024-07-19 DIAGNOSIS — Z8679 Personal history of other diseases of the circulatory system: Secondary | ICD-10-CM

## 2024-07-19 DIAGNOSIS — Z79899 Other long term (current) drug therapy: Secondary | ICD-10-CM

## 2024-07-19 DIAGNOSIS — M0579 Rheumatoid arthritis with rheumatoid factor of multiple sites without organ or systems involvement: Secondary | ICD-10-CM

## 2024-07-19 DIAGNOSIS — G629 Polyneuropathy, unspecified: Secondary | ICD-10-CM

## 2024-07-19 DIAGNOSIS — Z8709 Personal history of other diseases of the respiratory system: Secondary | ICD-10-CM

## 2024-07-19 DIAGNOSIS — M8589 Other specified disorders of bone density and structure, multiple sites: Secondary | ICD-10-CM

## 2024-07-19 DIAGNOSIS — E559 Vitamin D deficiency, unspecified: Secondary | ICD-10-CM

## 2024-07-19 DIAGNOSIS — J383 Other diseases of vocal cords: Secondary | ICD-10-CM

## 2024-07-19 DIAGNOSIS — M19042 Primary osteoarthritis, left hand: Secondary | ICD-10-CM

## 2024-07-19 DIAGNOSIS — Z8639 Personal history of other endocrine, nutritional and metabolic disease: Secondary | ICD-10-CM

## 2024-07-27 ENCOUNTER — Ambulatory Visit: Payer: Self-pay

## 2024-07-27 NOTE — Telephone Encounter (Signed)
 1st attempt to contact. No answer, no voicemail. Call placed in call backs for additional attempts to contact.   Copied from CRM #[id number]. Topic: Clinical - Medical Advice >> Jul 27, 2024 11:15 AM Victoria B wrote: Reason for CRM: patient called in states has been doubling up on the med, solifenacin  (VESICARE ) 5 MG tablet. She wants to have it increased to 10mg 

## 2024-09-13 ENCOUNTER — Ambulatory Visit: Admitting: Physician Assistant

## 2024-10-31 ENCOUNTER — Ambulatory Visit: Admitting: Family Medicine

## 2025-04-05 ENCOUNTER — Ambulatory Visit
